# Patient Record
Sex: Female | Born: 1960 | Race: White | Hispanic: No | Marital: Married | Smoking: Never smoker
Health system: Southern US, Community
[De-identification: ages and names within clinical notes are randomized; demographics above are authoritative.]

## PROBLEM LIST (undated history)

## (undated) DIAGNOSIS — E079 Disorder of thyroid, unspecified: Secondary | ICD-10-CM

## (undated) DIAGNOSIS — G4730 Sleep apnea, unspecified: Secondary | ICD-10-CM

## (undated) DIAGNOSIS — G4700 Insomnia, unspecified: Secondary | ICD-10-CM

## (undated) DIAGNOSIS — M797 Fibromyalgia: Secondary | ICD-10-CM

## (undated) DIAGNOSIS — B009 Herpesviral infection, unspecified: Secondary | ICD-10-CM

## (undated) DIAGNOSIS — E559 Vitamin D deficiency, unspecified: Secondary | ICD-10-CM

## (undated) DIAGNOSIS — F32A Depression, unspecified: Secondary | ICD-10-CM

## (undated) DIAGNOSIS — L408 Other psoriasis: Secondary | ICD-10-CM

## (undated) DIAGNOSIS — F329 Major depressive disorder, single episode, unspecified: Secondary | ICD-10-CM

## (undated) DIAGNOSIS — E785 Hyperlipidemia, unspecified: Secondary | ICD-10-CM

## (undated) DIAGNOSIS — K219 Gastro-esophageal reflux disease without esophagitis: Secondary | ICD-10-CM

## (undated) DIAGNOSIS — T7840XA Allergy, unspecified, initial encounter: Secondary | ICD-10-CM

## (undated) DIAGNOSIS — M1990 Unspecified osteoarthritis, unspecified site: Secondary | ICD-10-CM

## (undated) DIAGNOSIS — E669 Obesity, unspecified: Secondary | ICD-10-CM

## (undated) HISTORY — PX: ESOPHAGOGASTRODUODENOSCOPY: SHX1529

## (undated) HISTORY — DX: Hyperlipidemia, unspecified: E78.5

## (undated) HISTORY — PX: OTHER SURGICAL HISTORY: SHX169

## (undated) HISTORY — DX: Sleep apnea, unspecified: G47.30

## (undated) HISTORY — DX: Gastro-esophageal reflux disease without esophagitis: K21.9

## (undated) HISTORY — DX: Insomnia, unspecified: G47.00

## (undated) HISTORY — DX: Major depressive disorder, single episode, unspecified: F32.9

## (undated) HISTORY — DX: Depression, unspecified: F32.A

## (undated) HISTORY — PX: LIPOMA EXCISION: SHX5283

## (undated) HISTORY — DX: Fibromyalgia: M79.7

## (undated) HISTORY — DX: Obesity, unspecified: E66.9

## (undated) HISTORY — DX: Vitamin D deficiency, unspecified: E55.9

## (undated) HISTORY — DX: Herpesviral infection, unspecified: B00.9

## (undated) HISTORY — DX: Allergy, unspecified, initial encounter: T78.40XA

## (undated) HISTORY — DX: Disorder of thyroid, unspecified: E07.9

## (undated) HISTORY — PX: COLONOSCOPY: SHX174

## (undated) HISTORY — PX: TONSILECTOMY, ADENOIDECTOMY, BILATERAL MYRINGOTOMY AND TUBES: SHX2538

---

## 2002-05-28 LAB — HM COLONOSCOPY

## 2003-12-25 ENCOUNTER — Ambulatory Visit: Payer: Self-pay

## 2004-01-21 ENCOUNTER — Ambulatory Visit: Payer: Self-pay

## 2004-05-06 ENCOUNTER — Ambulatory Visit (HOSPITAL_COMMUNITY): Admission: RE | Admit: 2004-05-06 | Discharge: 2004-05-06 | Payer: Self-pay | Admitting: Gynecology

## 2004-11-29 ENCOUNTER — Ambulatory Visit: Payer: Self-pay

## 2005-01-17 ENCOUNTER — Ambulatory Visit: Payer: Self-pay

## 2005-03-13 HISTORY — PX: ABDOMINAL HYSTERECTOMY: SHX81

## 2006-10-12 ENCOUNTER — Ambulatory Visit: Payer: Self-pay | Admitting: Internal Medicine

## 2006-11-01 ENCOUNTER — Ambulatory Visit: Payer: Self-pay | Admitting: Internal Medicine

## 2006-11-12 ENCOUNTER — Ambulatory Visit: Payer: Self-pay | Admitting: Internal Medicine

## 2006-12-05 ENCOUNTER — Ambulatory Visit: Payer: Self-pay | Admitting: Unknown Physician Specialty

## 2006-12-12 ENCOUNTER — Ambulatory Visit: Payer: Self-pay | Admitting: Internal Medicine

## 2006-12-14 ENCOUNTER — Ambulatory Visit: Payer: Self-pay | Admitting: Unknown Physician Specialty

## 2006-12-20 ENCOUNTER — Ambulatory Visit: Payer: Self-pay | Admitting: Unknown Physician Specialty

## 2007-02-11 ENCOUNTER — Ambulatory Visit: Payer: Self-pay | Admitting: Internal Medicine

## 2007-02-19 ENCOUNTER — Ambulatory Visit: Payer: Self-pay | Admitting: Internal Medicine

## 2007-03-14 ENCOUNTER — Ambulatory Visit: Payer: Self-pay | Admitting: Internal Medicine

## 2007-05-20 ENCOUNTER — Ambulatory Visit: Payer: Self-pay | Admitting: Internal Medicine

## 2007-12-06 ENCOUNTER — Ambulatory Visit: Payer: Self-pay | Admitting: Family Medicine

## 2010-06-03 ENCOUNTER — Ambulatory Visit: Payer: Self-pay | Admitting: Family Medicine

## 2010-06-03 LAB — HM MAMMOGRAPHY: HM MAMMO: NORMAL

## 2011-01-03 LAB — HM PAP SMEAR: HM Pap smear: NORMAL

## 2012-09-26 LAB — LIPID PANEL
Cholesterol: 267 mg/dL — AB (ref 0–200)
HDL: 47 mg/dL (ref 35–70)
LDL Cholesterol: 164 mg/dL
TRIGLYCERIDES: 278 mg/dL — AB (ref 40–160)

## 2013-03-18 ENCOUNTER — Ambulatory Visit: Payer: Self-pay | Admitting: Family Medicine

## 2013-03-28 ENCOUNTER — Ambulatory Visit: Payer: Self-pay | Admitting: Family Medicine

## 2013-08-05 ENCOUNTER — Inpatient Hospital Stay: Payer: Self-pay | Admitting: Surgery

## 2013-08-05 LAB — COMPREHENSIVE METABOLIC PANEL
ANION GAP: 8 (ref 7–16)
AST: 13 U/L — AB (ref 15–37)
Albumin: 3.5 g/dL (ref 3.4–5.0)
Alkaline Phosphatase: 149 U/L — ABNORMAL HIGH
BUN: 20 mg/dL — ABNORMAL HIGH (ref 7–18)
Bilirubin,Total: 0.3 mg/dL (ref 0.2–1.0)
CHLORIDE: 104 mmol/L (ref 98–107)
CO2: 25 mmol/L (ref 21–32)
CREATININE: 0.96 mg/dL (ref 0.60–1.30)
Calcium, Total: 9.5 mg/dL (ref 8.5–10.1)
EGFR (African American): >60
EGFR (Non-African Amer.): >60
GLUCOSE: 106 mg/dL — AB (ref 65–99)
Osmolality: 277 (ref 275–301)
Potassium: 3.9 mmol/L (ref 3.5–5.1)
SGPT (ALT): 19 U/L (ref 12–78)
Sodium: 137 mmol/L (ref 136–145)
Total Protein: 8.1 g/dL (ref 6.4–8.2)

## 2013-08-05 LAB — CBC
HCT: 39.3 % (ref 35.0–47.0)
HGB: 12.7 g/dL (ref 12.0–16.0)
MCH: 27.1 pg (ref 26.0–34.0)
MCHC: 32.3 g/dL (ref 32.0–36.0)
MCV: 84 fL (ref 80–100)
PLATELETS: 336 10*3/uL (ref 150–440)
RBC: 4.69 10*6/uL (ref 3.80–5.20)
RDW: 14.5 % (ref 11.5–14.5)
WBC: 9.3 10*3/uL (ref 3.6–11.0)

## 2013-08-05 LAB — URINALYSIS, COMPLETE
BACTERIA: NONE SEEN
Bilirubin,UR: NEGATIVE
Blood: NEGATIVE
Glucose,UR: NEGATIVE mg/dL (ref 0–75)
Ketone: NEGATIVE
LEUKOCYTE ESTERASE: NEGATIVE
NITRITE: NEGATIVE
Ph: 6 (ref 4.5–8.0)
Protein: 30
RBC,UR: 1 /HPF (ref 0–5)
Specific Gravity: 1.025 (ref 1.003–1.030)
Squamous Epithelial: 3
WBC UR: 3 /HPF (ref 0–5)

## 2013-08-05 LAB — LIPASE, BLOOD: LIPASE: 231 U/L (ref 73–393)

## 2013-08-06 LAB — COMPREHENSIVE METABOLIC PANEL
ALBUMIN: 3 g/dL — AB (ref 3.4–5.0)
ALK PHOS: 145 U/L — AB
Anion Gap: 6 — ABNORMAL LOW (ref 7–16)
BILIRUBIN TOTAL: 0.4 mg/dL (ref 0.2–1.0)
BUN: 17 mg/dL (ref 7–18)
CHLORIDE: 104 mmol/L (ref 98–107)
Calcium, Total: 8.7 mg/dL (ref 8.5–10.1)
Co2: 29 mmol/L (ref 21–32)
Creatinine: 0.96 mg/dL (ref 0.60–1.30)
EGFR (African American): >60
EGFR (Non-African Amer.): >60
Glucose: 94 mg/dL (ref 65–99)
OSMOLALITY: 279 (ref 275–301)
Potassium: 3.7 mmol/L (ref 3.5–5.1)
SGOT(AST): 17 U/L (ref 15–37)
SGPT (ALT): 22 U/L (ref 12–78)
SODIUM: 139 mmol/L (ref 136–145)
Total Protein: 6.8 g/dL (ref 6.4–8.2)

## 2013-08-06 LAB — CBC WITH DIFFERENTIAL/PLATELET
Basophil #: 0 10*3/uL (ref 0.0–0.1)
Basophil %: 0.9 %
Eosinophil #: 0 10*3/uL (ref 0.0–0.7)
Eosinophil %: 0.6 %
HCT: 35.3 % (ref 35.0–47.0)
HGB: 11.5 g/dL — ABNORMAL LOW (ref 12.0–16.0)
LYMPHS PCT: 32.2 %
Lymphocyte #: 1.7 10*3/uL (ref 1.0–3.6)
MCH: 27.8 pg (ref 26.0–34.0)
MCHC: 32.5 g/dL (ref 32.0–36.0)
MCV: 85 fL (ref 80–100)
Monocyte #: 0.4 x10 3/mm (ref 0.2–0.9)
Monocyte %: 7 %
NEUTROS ABS: 3.2 10*3/uL (ref 1.4–6.5)
Neutrophil %: 59.3 %
Platelet: 245 10*3/uL (ref 150–440)
RBC: 4.14 10*6/uL (ref 3.80–5.20)
RDW: 14.9 % — ABNORMAL HIGH (ref 11.5–14.5)
WBC: 5.3 10*3/uL (ref 3.6–11.0)

## 2013-08-07 LAB — COMPREHENSIVE METABOLIC PANEL
ANION GAP: 3 — AB (ref 7–16)
AST: 35 U/L (ref 15–37)
Albumin: 3.1 g/dL — ABNORMAL LOW (ref 3.4–5.0)
Alkaline Phosphatase: 137 U/L — ABNORMAL HIGH
BILIRUBIN TOTAL: 0.2 mg/dL (ref 0.2–1.0)
BUN: 9 mg/dL (ref 7–18)
CREATININE: 1.02 mg/dL (ref 0.60–1.30)
Calcium, Total: 9.1 mg/dL (ref 8.5–10.1)
Chloride: 105 mmol/L (ref 98–107)
Co2: 30 mmol/L (ref 21–32)
EGFR (African American): >60
EGFR (Non-African Amer.): >60
GLUCOSE: 135 mg/dL — AB (ref 65–99)
Osmolality: 276 (ref 275–301)
POTASSIUM: 4.8 mmol/L (ref 3.5–5.1)
SGPT (ALT): 35 U/L (ref 12–78)
Sodium: 138 mmol/L (ref 136–145)
Total Protein: 7.2 g/dL (ref 6.4–8.2)

## 2013-08-07 LAB — CBC WITH DIFFERENTIAL/PLATELET
BASOS ABS: 0 10*3/uL (ref 0.0–0.1)
Basophil %: 0.1 %
Eosinophil #: 0 10*3/uL (ref 0.0–0.7)
Eosinophil %: 0 %
HCT: 35.1 % (ref 35.0–47.0)
HGB: 11.3 g/dL — ABNORMAL LOW (ref 12.0–16.0)
LYMPHS ABS: 1 10*3/uL (ref 1.0–3.6)
Lymphocyte %: 8.1 %
MCH: 27.3 pg (ref 26.0–34.0)
MCHC: 32.1 g/dL (ref 32.0–36.0)
MCV: 85 fL (ref 80–100)
Monocyte #: 0.3 x10 3/mm (ref 0.2–0.9)
Monocyte %: 2.8 %
NEUTROS ABS: 11.1 10*3/uL — AB (ref 1.4–6.5)
NEUTROS PCT: 89 %
PLATELETS: 283 10*3/uL (ref 150–440)
RBC: 4.14 10*6/uL (ref 3.80–5.20)
RDW: 14.3 % (ref 11.5–14.5)
WBC: 12.4 10*3/uL — ABNORMAL HIGH (ref 3.6–11.0)

## 2013-08-07 LAB — PATHOLOGY REPORT

## 2014-07-04 NOTE — H&P (Signed)
PATIENT NAME:  Debra Soto, HASELEY MR#:  [id number] DATE OF BIRTH:  1960-12-03  DATE OF ADMISSION:  08/05/2013  CHIEF COMPLAINT: Right upper quadrant pain.   HISTORY OF PRESENT ILLNESS: This is a patient with a first and single episode of right upper quadrant pain, started at 5:00 this morning. It has much improved, but is still present. She had multiple emesis and nausea earlier. Denies fevers or chills. Denies jaundice or acholic stools. Has never had an episode like this before. I was asked to see the patient in the Emergency Room concerning her ongoing pain and nausea.   PAST MEDICAL HISTORY: Fibromyalgia, hypothyroidism.   PAST SURGICAL HISTORY: Tumor on the right arm, bladder suspension and hysterectomy. No other abdominal surgeries.   ALLERGIES: None.   MEDICATIONS: Multiple, see medical reconciliation.   FAMILY HISTORY: Noncontributory.   SOCIAL HISTORY: The patient does not smoke. She runs a [employer].   REVIEW OF SYSTEMS: Ten system review is performed and negative with the exception of that mentioned in the history of present illness.   PHYSICAL EXAMINATION: GENERAL: Obese female patient.  VITAL SIGNS: Weight 240 pounds, BMI of 36.5. Temperature 97.6, pulse 67, respirations 18, blood pressure 117/69, pain scale of two, 99% room air sat.  HEENT: Shows no scleral icterus.  NECK: No palpable neck nodes.  CHEST: Clear to auscultation.  CARDIAC: Regular rate and rhythm.   ABDOMEN: Soft. There is tenderness in the right upper quadrant with a questionable Murphy's sign.  EXTREMITIES: Without edema.  NEUROLOGIC: Grossly intact.  INTEGUMENT: No jaundice.   An ultrasound shows stones with a negative sonographic Murphy's sign.   LABORATORY VALUES: Demonstrate a normal white blood cell count and no elevation of the liver function tests.   ASSESSMENT AND PLAN: This is a patient with probable early acute cholecystitis. She has had some nausea, vomiting and pain control  issues. I have recommended admission to the hospital and hydration, starting IV antibiotics and then performing a laparoscopic cholecystectomy. I described for her the procedure itself and also the options of observation and scheduling this as an outpatient at a later date, but she has had some nausea and vomiting as well and desires admission at this time. The risks of bleeding and infection, conversion to an open procedure were reviewed. She understood and agreed to proceed.   ____________________________ Jerrol Banana Burt Knack, MD rec:sg D: 08/05/2013 14:42:22 ET T: 08/05/2013 15:06:38 ET JOB#: [id number]  cc: Jerrol Banana. Burt Knack, MD, <Dictator> Florene Glen MD ELECTRONICALLY SIGNED 08/05/2013 18:16

## 2014-07-04 NOTE — Op Note (Signed)
PATIENT NAME:  Debra, Soto MR#:  [id number] DATE OF BIRTH:  January 13, 1961  DATE OF PROCEDURE:  08/06/2013  PREOPERATIVE DIAGNOSIS: Acute cholecystitis.   POSTOPERATIVE DIAGNOSIS: Acute cholecystitis.   PROCEDURE: Laparoscopic cholecystectomy.   SURGEON: Latressa Harries E. Burt Knack, MD  ANESTHESIA: General with endotracheal tube.   INDICATIONS: This is a patient with right upper quadrant pain and workup showing gallstones, possible acute cholecystitis. Preoperatively, we discussed the rationale for surgery, the options of observation, risk of bleeding, infection, recurrence of symptoms, failure to resolve her symptoms, open procedure, bile duct damage, bile duct leak, retained common bile duct stone, any of which could require further surgery and/or ERCP, stent and papillotomy. This was all reviewed for her in the preop holding area, she understood and agreed to proceed.   FINDINGS: Inflamed gallbladder with some adhesions around it.   DESCRIPTION OF PROCEDURE: The patient was induced to general anesthesia. She was on IV antibiotics. VTE prophylaxis was in place. She was prepped and draped in a sterile fashion. Marcaine was infiltrated in skin and subcutaneous tissues in the periumbilical area, and an incision was made. Veress needle was placed. Pneumoperitoneum was obtained. A 5 mm trocar port was placed. The abdominal cavity was explored, and under direct vision, a 10 mm epigastric port and 2 lateral 5 mm ports were placed. The gallbladder was placed on tension. The peritoneum over the infundibulum was incised bluntly after taking down some adhesions bluntly. The cystic duct-gallbladder junction was well identified, doubly clipped and divided. The cystic artery and cystic lymphatics were doubly clipped and divided, and the gallbladder was taken from the gallbladder fossa with electrocautery and passed out through the epigastric port site. The port site was replaced. The area was irrigated with copious amounts  of normal saline. Hemostasis was adequate. There was no sign of bleeding, bile leak or bowel injury. The camera was placed in the epigastric site to view back to the periumbilical site. Again, no sign of bowel injury or bleeding. Therefore, pneumoperitoneum was released. All ports were removed. Fascial edges at the epigastric site were approximated with figure-of-eight 0 Vicryls, and 4-0 subcuticular Monocryl was used on all skin edges. Steri-Strips, Mastisol and sterile dressings were placed.   The patient tolerated the procedure well. There were no complications. She was taken to the recovery room in stable condition to be admitted for continued care.    ____________________________ Jerrol Banana. Burt Knack, MD rec:lb D: 08/06/2013 11:07:56 ET T: 08/06/2013 11:31:48 ET JOB#: [id number]  cc: Jerrol Banana. Burt Knack, MD, <Dictator> Florene Glen MD ELECTRONICALLY SIGNED 08/06/2013 14:38

## 2014-08-12 HISTORY — PX: CHOLECYSTECTOMY: SHX55

## 2014-08-25 ENCOUNTER — Other Ambulatory Visit: Payer: Self-pay | Admitting: Family Medicine

## 2014-08-25 NOTE — Telephone Encounter (Signed)
Medication Refill

## 2014-08-25 NOTE — Telephone Encounter (Signed)
I will send one month, she should try going to [facility] if she has a gap in her insurance

## 2014-08-25 NOTE — Telephone Encounter (Signed)
Patient notified of 1 month of Wellbutrin called into pharmacy and she would need a follow up for further refills. Also informed patient of [facility] due to [employer].

## 2014-08-25 NOTE — Telephone Encounter (Signed)
Needs follow-up

## 2014-09-19 ENCOUNTER — Other Ambulatory Visit: Payer: Self-pay | Admitting: Family Medicine

## 2014-09-19 NOTE — Telephone Encounter (Signed)
Please make sure patient has a follow up - 3 to 4 months from her last visit. Thank you

## 2014-09-29 ENCOUNTER — Encounter: Payer: Self-pay | Admitting: Family Medicine

## 2014-09-29 DIAGNOSIS — Z803 Family history of malignant neoplasm of breast: Secondary | ICD-10-CM | POA: Insufficient documentation

## 2014-09-29 DIAGNOSIS — K589 Irritable bowel syndrome without diarrhea: Secondary | ICD-10-CM | POA: Insufficient documentation

## 2014-09-29 DIAGNOSIS — M797 Fibromyalgia: Secondary | ICD-10-CM | POA: Insufficient documentation

## 2014-09-29 DIAGNOSIS — N3941 Urge incontinence: Secondary | ICD-10-CM | POA: Insufficient documentation

## 2014-09-29 DIAGNOSIS — N951 Menopausal and female climacteric states: Secondary | ICD-10-CM | POA: Insufficient documentation

## 2014-09-29 DIAGNOSIS — E039 Hypothyroidism, unspecified: Secondary | ICD-10-CM | POA: Insufficient documentation

## 2014-09-29 DIAGNOSIS — E559 Vitamin D deficiency, unspecified: Secondary | ICD-10-CM | POA: Insufficient documentation

## 2014-09-29 DIAGNOSIS — F339 Major depressive disorder, recurrent, unspecified: Secondary | ICD-10-CM | POA: Insufficient documentation

## 2014-09-29 DIAGNOSIS — E785 Hyperlipidemia, unspecified: Secondary | ICD-10-CM | POA: Insufficient documentation

## 2014-09-29 DIAGNOSIS — K219 Gastro-esophageal reflux disease without esophagitis: Secondary | ICD-10-CM | POA: Insufficient documentation

## 2014-09-29 DIAGNOSIS — R809 Proteinuria, unspecified: Secondary | ICD-10-CM | POA: Insufficient documentation

## 2014-09-29 DIAGNOSIS — L503 Dermatographic urticaria: Secondary | ICD-10-CM | POA: Insufficient documentation

## 2014-09-29 DIAGNOSIS — G4733 Obstructive sleep apnea (adult) (pediatric): Secondary | ICD-10-CM | POA: Insufficient documentation

## 2014-09-29 DIAGNOSIS — G4700 Insomnia, unspecified: Secondary | ICD-10-CM | POA: Insufficient documentation

## 2014-09-29 DIAGNOSIS — J309 Allergic rhinitis, unspecified: Secondary | ICD-10-CM | POA: Insufficient documentation

## 2014-09-29 DIAGNOSIS — B001 Herpesviral vesicular dermatitis: Secondary | ICD-10-CM | POA: Insufficient documentation

## 2014-09-29 DIAGNOSIS — G44209 Tension-type headache, unspecified, not intractable: Secondary | ICD-10-CM | POA: Insufficient documentation

## 2014-09-30 ENCOUNTER — Ambulatory Visit: Payer: Self-pay | Admitting: Family Medicine

## 2014-10-02 ENCOUNTER — Other Ambulatory Visit: Payer: Self-pay | Admitting: Family Medicine

## 2014-10-02 NOTE — Telephone Encounter (Signed)
Patient requesting refill. 

## 2014-10-03 ENCOUNTER — Other Ambulatory Visit: Payer: Self-pay | Admitting: Family Medicine

## 2014-10-03 NOTE — Telephone Encounter (Signed)
Needs follow-up

## 2014-12-22 ENCOUNTER — Ambulatory Visit: Payer: Self-pay | Admitting: Family Medicine

## 2014-12-31 ENCOUNTER — Ambulatory Visit (INDEPENDENT_AMBULATORY_CARE_PROVIDER_SITE_OTHER): Payer: Self-pay | Admitting: Family Medicine

## 2014-12-31 ENCOUNTER — Encounter: Payer: Self-pay | Admitting: Family Medicine

## 2014-12-31 VITALS — BP 134/78 | HR 92 | Temp 98.1°F | Resp 18 | Ht 68.0 in | Wt 262.3 lb

## 2014-12-31 DIAGNOSIS — N809 Endometriosis, unspecified: Secondary | ICD-10-CM | POA: Insufficient documentation

## 2014-12-31 DIAGNOSIS — K219 Gastro-esophageal reflux disease without esophagitis: Secondary | ICD-10-CM

## 2014-12-31 DIAGNOSIS — M797 Fibromyalgia: Secondary | ICD-10-CM

## 2014-12-31 DIAGNOSIS — E038 Other specified hypothyroidism: Secondary | ICD-10-CM

## 2014-12-31 DIAGNOSIS — G4733 Obstructive sleep apnea (adult) (pediatric): Secondary | ICD-10-CM

## 2014-12-31 DIAGNOSIS — G4700 Insomnia, unspecified: Secondary | ICD-10-CM

## 2014-12-31 DIAGNOSIS — E785 Hyperlipidemia, unspecified: Secondary | ICD-10-CM

## 2014-12-31 DIAGNOSIS — F329 Major depressive disorder, single episode, unspecified: Secondary | ICD-10-CM

## 2014-12-31 DIAGNOSIS — Z23 Encounter for immunization: Secondary | ICD-10-CM

## 2014-12-31 MED ORDER — LEVOTHYROXINE SODIUM 200 MCG PO TABS: 200.0000 ug | ORAL_TABLET | Freq: Every day | ORAL | Status: DC

## 2014-12-31 MED ORDER — CITALOPRAM HYDROBROMIDE 40 MG PO TABS: 40.0000 mg | ORAL_TABLET | Freq: Every day | ORAL | Status: DC

## 2014-12-31 MED ORDER — TRAZODONE HCL 50 MG PO TABS: 50.0000 mg | ORAL_TABLET | Freq: Every evening | ORAL | Status: DC | PRN

## 2014-12-31 MED ORDER — IBUPROFEN 600 MG PO TABS: 600.0000 mg | ORAL_TABLET | Freq: Three times a day (TID) | ORAL | Status: DC

## 2014-12-31 MED ORDER — OMEPRAZOLE 20 MG PO CPDR: 20.0000 mg | DELAYED_RELEASE_CAPSULE | Freq: Two times a day (BID) | ORAL | Status: DC

## 2014-12-31 MED ORDER — AMPHETAMINE-DEXTROAMPHET ER 10 MG PO CP24: 10.0000 mg | ORAL_CAPSULE | Freq: Every day | ORAL | Status: DC

## 2014-12-31 MED ORDER — BACLOFEN 10 MG PO TABS: 10.0000 mg | ORAL_TABLET | ORAL | Status: DC | PRN

## 2014-12-31 MED ORDER — BUPROPION HCL ER (XL) 150 MG PO TB24: 150.0000 mg | ORAL_TABLET | Freq: Every day | ORAL | Status: DC

## 2014-12-31 NOTE — Progress Notes (Signed)
Name: Debra Soto   MRN: [id number]    DOB: 1960/09/19   Date:12/31/2014       Progress Note  Subjective  Chief Complaint  Chief Complaint  Patient presents with  . Medication Refill    follow-up not currently on any medications(she has ran out)  . Depression  . Fibromyalgia  . Gastroesophageal Reflux  . Menopause    HPI  Major Depression: she has been out of her medications, gap in insurance. She has been very depressed, emotional liability, angry, crying spells. She feels tired all the time, but not sure if from [facility]. She denies suicidal thoughts or ideation.  Her 64 yo daughter and 29 yo son are still living at home, also carrying for her disabled mother.   FMS: she has daily pain, has taken Hydrocodone prn for pain, but we will try to resume other medications first including Ibuprofen. She also has severe mental fogginess, fatigue. Stiffness.   GERD: well controlled with Omeprazole twice daily, but when she tries to wean self off symptoms gets worse  Insomnia: takes Trazodone prn, would like to refill medication  Menopause: night sweats have improved, but having emotional liability. Off Clonidine. Still has hot flashes, we will resume antidepressants.   Hypothyroidism: she has been off medication for months, and we will resume medication, same dose , but needs to take half daily for the first week and titrate up   OSA: she has not been using her CPAP machine and is only sleeping at most 4 hours per night, out of Trazodone  Patient Active Problem List   Diagnosis Date Noted  . Endometriosis 12/31/2014  . Insomnia, persistent 09/29/2014  . IBS (irritable bowel syndrome) 09/29/2014  . 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 Dyslipidemia 09/29/2014  . Fibromyalgia syndrome 09/29/2014  . Gastro-esophageal reflux disease without esophagitis 09/29/2014  . Herpes labialis 09/29/2014  . Adult hypothyroidism 09/29/2014  . Family history of malignant neoplasm of breast 09/29/2014   . Extreme obesity ([place]) 09/29/2014  . Major depressive episode 09/29/2014  . Obstructive apnea 09/29/2014  . Abnormal presence of protein in urine 09/29/2014  . Allergic rhinitis 09/29/2014  . Vitamin D deficiency 09/29/2014  . Urge incontinence 09/29/2014  . Headache, tension-type 09/29/2014  . Menopausal symptom 09/29/2014    Past Surgical History  Procedure Laterality Date  . Cholecystectomy  [id number]  . Abdominal hysterectomy  [id number]  . Tonsilectomy, adenoidectomy, bilateral myringotomy and tubes    . Lipoma excision    . Bladder tact      Family History  Problem Relation Age of Onset  . Cancer Mother     ovarian,breat  . Hypertension Mother   . Arthritis Mother   . Diabetes Brother   . Hypertension Brother     Social History   Social History  . Marital Status: Married    Spouse Name: N/A  . Number of Children: N/A  . Years of Education: N/A   Occupational History  . Not on file.   Social History Main Topics  . Smoking status: Never Smoker   . Smokeless tobacco: Never Used  . Alcohol Use: 0.0 oz/week    0 Standard drinks or equivalent per week  . Drug Use: No  . Sexual Activity: Yes   Other Topics Concern  . Not on file   Social History Narrative     Current outpatient prescriptions:  .  baclofen (LIORESAL) 10 MG tablet, Take 1 tablet (10 mg total) by mouth as needed., Disp: 30 each, Rfl:  2 .  citalopram (CELEXA) 40 MG tablet, Take 1 tablet (40 mg total) by mouth daily., Disp: 30 tablet, Rfl: 2 .  valACYclovir (VALTREX) 1000 MG tablet, Take 1 tablet by mouth as needed., Disp: , Rfl:  .  amphetamine-dextroamphetamine (ADDERALL XR) 10 MG 24 hr capsule, Take 1 capsule (10 mg total) by mouth daily., Disp: 30 capsule, Rfl: 0 .  buPROPion (WELLBUTRIN XL) 150 MG 24 hr tablet, Take 1 tablet (150 mg total) by mouth daily., Disp: 30 tablet, Rfl: 2 .  ibuprofen (ADVIL,MOTRIN) 600 MG tablet, Take 1 tablet (600 mg total) by mouth 3 (three) times daily., Disp:  90 tablet, Rfl: 0 .  levothyroxine (SYNTHROID) 200 MCG tablet, Take 1 tablet (200 mcg total) by mouth daily., Disp: 30 tablet, Rfl: 0 .  omeprazole (PRILOSEC) 20 MG capsule, Take 1 capsule (20 mg total) by mouth 2 (two) times daily before a meal., Disp: 60 capsule, Rfl: 2 .  traZODone (DESYREL) 50 MG tablet, Take 1 tablet (50 mg total) by mouth at bedtime as needed., Disp: 30 tablet, Rfl: 2  Not on File   ROS  Constitutional: Negative for fever, positive for mild  weight change.  Respiratory: Negative for cough and shortness of breath.   Cardiovascular: Negative for chest pain or palpitations.  Gastrointestinal: Negative for abdominal pain, no bowel changes.  Musculoskeletal: Negative for gait problem or joint swelling.  Skin: Negative for rash.  Neurological: Negative for dizziness or headache.  No other specific complaints in a complete review of systems (except as listed in HPI above).  Objective  Filed Vitals:   12/31/14 1103  BP: 128/92  Pulse: 92  Temp: 98.1 F (36.7 C)  TempSrc: Oral  Resp: 18  Height: 5\' 8"  (1.727 m)  Weight: 262 lb 4.8 oz (118.978 kg)  SpO2: 96%    Body mass index is 39.89 kg/(m^2).  Physical Exam  Constitutional: Patient appears well-developed and well-nourished. Obese No distress.  HEENT: head atraumatic, normocephalic, pupils equal and reactive to light, neck supple, throat within normal limits Cardiovascular: Normal rate, regular rhythm and normal heart sounds.  No murmur heard. No BLE edema. Pulmonary/Chest: Effort normal and breath sounds normal. No respiratory distress. Abdominal: Soft.  There is no tenderness. Psychiatric: Patient is sad, cried during visit Judgment and thought content normal. Muscular Skeletal: trigger point positive 18   PHQ2/9: Depression screen PHQ 2/9 12/31/2014  Decreased Interest 0  Down, Depressed, Hopeless 1  PHQ - 2 Score 1     Fall Risk: Fall Risk  12/31/2014  Falls in the past year? No      Functional Status Survey: Is the patient deaf or have difficulty hearing?: No Does the patient have difficulty seeing, even when wearing glasses/contacts?: Yes (reading glasses) Does the patient have difficulty concentrating, remembering, or making decisions?: No Does the patient have difficulty walking or climbing stairs?: No Does the patient have difficulty dressing or bathing?: No Does the patient have difficulty doing errands alone such as visiting a doctor's office or shopping?: No   Assessment & Plan  1. Major depressive episode  Resume medication  - amphetamine-dextroamphetamine (ADDERALL XR) 10 MG 24 hr capsule; Take 1 capsule (10 mg total) by mouth daily.  Dispense: 30 capsule; Refill: 0 - buPROPion (WELLBUTRIN XL) 150 MG 24 hr tablet; Take 1 tablet (150 mg total) by mouth daily.  Dispense: 30 tablet; Refill: 2 - citalopram (CELEXA) 40 MG tablet; Take 1 tablet (40 mg total) by mouth daily.  Dispense: 30 tablet;  Refill: 2  2. Needs flu shot  - Flu Vaccine QUAD 36+ mos PF IM (Fluarix & Fluzone Quad PF)  3. Dyslipidemia  Recheck labs next visit  4. Other specified hypothyroidism  - levothyroxine (SYNTHROID) 200 MCG tablet; Take 1 tablet (200 mcg total) by mouth daily.  Dispense: 30 tablet; Refill: 0  5. Obstructive apnea  Needs to resume medication   6. Gastro-esophageal reflux disease without esophagitis  Controlled with medication   - omeprazole (PRILOSEC) 20 MG capsule; Take 1 capsule (20 mg total) by mouth 2 (two) times daily before a meal.  Dispense: 60 capsule; Refill: 2  7. Fibromyalgia syndrome  Resume medications , hold off on Hydrocodone - amphetamine-dextroamphetamine (ADDERALL XR) 10 MG 24 hr capsule; Take 1 capsule (10 mg total) by mouth daily.  Dispense: 30 capsule; Refill: 0 - baclofen (LIORESAL) 10 MG tablet; Take 1 tablet (10 mg total) by mouth as needed.  Dispense: 30 each; Refill: 2 - ibuprofen (ADVIL,MOTRIN) 600 MG tablet; Take 1 tablet  (600 mg total) by mouth 3 (three) times daily.  Dispense: 90 tablet; Refill: 0  8. Insomnia, persistent  - traZODone (DESYREL) 50 MG tablet; Take 1 tablet (50 mg total) by mouth at bedtime as needed.  Dispense: 30 tablet; Refill: 2  9. Need for tetanus booster  - Tdap vaccine greater than or equal to 7yo IM

## 2015-01-29 ENCOUNTER — Ambulatory Visit (INDEPENDENT_AMBULATORY_CARE_PROVIDER_SITE_OTHER): Payer: 59 | Admitting: Family Medicine

## 2015-01-29 ENCOUNTER — Encounter: Payer: Self-pay | Admitting: Family Medicine

## 2015-01-29 VITALS — HR 95 | Temp 98.6°F | Resp 16 | Ht 68.0 in | Wt 263.4 lb

## 2015-01-29 DIAGNOSIS — Z79899 Other long term (current) drug therapy: Secondary | ICD-10-CM

## 2015-01-29 DIAGNOSIS — R3915 Urgency of urination: Secondary | ICD-10-CM | POA: Diagnosis not present

## 2015-01-29 DIAGNOSIS — E038 Other specified hypothyroidism: Secondary | ICD-10-CM

## 2015-01-29 DIAGNOSIS — R194 Change in bowel habit: Secondary | ICD-10-CM

## 2015-01-29 DIAGNOSIS — E559 Vitamin D deficiency, unspecified: Secondary | ICD-10-CM

## 2015-01-29 DIAGNOSIS — F329 Major depressive disorder, single episode, unspecified: Secondary | ICD-10-CM | POA: Diagnosis not present

## 2015-01-29 DIAGNOSIS — E785 Hyperlipidemia, unspecified: Secondary | ICD-10-CM | POA: Diagnosis not present

## 2015-01-29 LAB — POCT URINALYSIS DIPSTICK
BILIRUBIN UA: NEGATIVE
Blood, UA: NEGATIVE
Glucose, UA: NEGATIVE
Ketones, UA: NEGATIVE
LEUKOCYTES UA: NEGATIVE
NITRITE UA: NEGATIVE
Spec Grav, UA: 1.015
Urobilinogen, UA: NEGATIVE
pH, UA: 5

## 2015-01-29 MED ORDER — AMPHETAMINE-DEXTROAMPHET ER 15 MG PO CP24: 15.0000 mg | ORAL_CAPSULE | ORAL | Status: DC

## 2015-01-29 MED ORDER — CIPROFLOXACIN HCL 250 MG PO TABS: 250.0000 mg | ORAL_TABLET | Freq: Two times a day (BID) | ORAL | Status: DC

## 2015-01-29 NOTE — Progress Notes (Signed)
Name: Debra Soto   MRN: [id number]    DOB: 05/28/1960   Date:01/29/2015       Progress Note  Subjective  Chief Complaint  Chief Complaint  Patient presents with  . Medication Management    1 month F/U  . Depression    Patient started back on Adderall, Wellbutrin, and Celexa.   . Urinary Frequency    Onset 2-3 days, patient states she has to go frequently and when she does go her urine is in big volumes. Patient wanted her urine checked due to not having major symptoms of a UTI until it has already because of bad, to be a kidney infection.     HPI  Urinary frequency: symptoms started 2-3 days ago, she has noticed urinary frequency, nocturia, urine odor, no back pain, no fever  Hypothyroidism: back on medication for the past month, we will recheck levels in 2 more weeks  Major Depression: she is back on Citalopram, Wellbutrin and Adderal. She states she is not as sad or crying like she was on her last visit, one month ago, but still not in remission, and feels tired all the time. We will increase dose of Adderal   Bowel Changes: she has a history of chronic constipation, as a young adult had IBS with diarrhea, however over the past few months she has noticed diarrhea, described loose or water stools, associated with abdominal cramping, happens at least 3 times daily, large amount, no blood or mucus in stools. She also has urgency and had one episode of bowel incontinence when unable to get to a bathroom. No recent travel, nobody else in the family has diarrhea.    Patient Active Problem List   Diagnosis Date Noted  . Endometriosis 12/31/2014  . Insomnia, persistent 09/29/2014  . IBS (irritable bowel syndrome) 09/29/2014  . 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 Dyslipidemia 09/29/2014  . Fibromyalgia syndrome 09/29/2014  . Gastro-esophageal reflux disease without esophagitis 09/29/2014  . Herpes labialis 09/29/2014  . Adult hypothyroidism 09/29/2014  . Family history of  malignant neoplasm of breast 09/29/2014  . Extreme obesity ([place]) 09/29/2014  . Major depressive episode 09/29/2014  . Obstructive apnea 09/29/2014  . Abnormal presence of protein in urine 09/29/2014  . Allergic rhinitis 09/29/2014  . Vitamin D deficiency 09/29/2014  . Urge incontinence 09/29/2014  . Headache, tension-type 09/29/2014  . Menopausal symptom 09/29/2014    Past Surgical History  Procedure Laterality Date  . Cholecystectomy  [id number]  . Abdominal hysterectomy  [id number]  . Tonsilectomy, adenoidectomy, bilateral myringotomy and tubes    . Lipoma excision    . Bladder tact      Family History  Problem Relation Age of Onset  . Cancer Mother     ovarian,breat  . Hypertension Mother   . Arthritis Mother   . Diabetes Brother   . Hypertension Brother     Social History   Social History  . Marital Status: Married    Spouse Name: N/A  . Number of Children: N/A  . Years of Education: N/A   Occupational History  . Not on file.   Social History Main Topics  . Smoking status: Never Smoker   . Smokeless tobacco: Never Used  . Alcohol Use: 0.0 oz/week    0 Standard drinks or equivalent per week  . Drug Use: No  . Sexual Activity: Yes   Other Topics Concern  . Not on file   Social History Narrative     Current outpatient prescriptions:  .  amphetamine-dextroamphetamine (ADDERALL XR) 15 MG 24 hr capsule, Take 1 capsule by mouth every morning., Disp: 30 capsule, Rfl: 0 .  baclofen (LIORESAL) 10 MG tablet, Take 1 tablet (10 mg total) by mouth as needed., Disp: 30 each, Rfl: 2 .  buPROPion (WELLBUTRIN XL) 150 MG 24 hr tablet, Take 1 tablet (150 mg total) by mouth daily., Disp: 30 tablet, Rfl: 2 .  ciprofloxacin (CIPRO) 250 MG tablet, Take 1 tablet (250 mg total) by mouth 2 (two) times daily., Disp: 6 tablet, Rfl: 0 .  citalopram (CELEXA) 40 MG tablet, Take 1 tablet (40 mg total) by mouth daily., Disp: 30 tablet, Rfl: 2 .  ibuprofen (ADVIL,MOTRIN) 600 MG tablet,  Take 1 tablet (600 mg total) by mouth 3 (three) times daily., Disp: 90 tablet, Rfl: 0 .  levothyroxine (SYNTHROID) 200 MCG tablet, Take 1 tablet (200 mcg total) by mouth daily., Disp: 30 tablet, Rfl: 0 .  omeprazole (PRILOSEC) 20 MG capsule, Take 1 capsule (20 mg total) by mouth 2 (two) times daily before a meal., Disp: 60 capsule, Rfl: 2 .  traZODone (DESYREL) 50 MG tablet, Take 1 tablet (50 mg total) by mouth at bedtime as needed., Disp: 30 tablet, Rfl: 2 .  valACYclovir (VALTREX) 1000 MG tablet, Take 1 tablet by mouth as needed., Disp: , Rfl:   Not on File   ROS  Constitutional: Negative for fever or weight change.  Respiratory: Negative for cough and shortness of breath.   Cardiovascular: Negative for chest pain or palpitations.  Gastrointestinal: Positive for abdominal pain, no bowel changes.  Musculoskeletal: Negative for gait problem or joint swelling.  Skin: Negative for rash.  Neurological: Negative for dizziness but has  headache.  No other specific complaints in a complete review of systems (except as listed in HPI above).  Objective  Filed Vitals:   01/29/15 1103  Pulse: 95  Temp: 98.6 F (37 C)  TempSrc: Oral  Resp: 16  Height: 5\' 8"  (1.727 m)  Weight: 263 lb 6.4 oz (119.477 kg)  SpO2: 97%    Body mass index is 40.06 kg/(m^2).  Physical Exam  Constitutional: Patient appears well-developed and well-nourished. Obese  No distress.  HEENT: head atraumatic, normocephalic, pupils equal and reactive to light, neck supple, throat within normal limits, no thyromegaly  Cardiovascular: Normal rate, regular rhythm and normal heart sounds.  No murmur heard. No BLE edema. Pulmonary/Chest: Effort normal and breath sounds normal. No respiratory distress. Abdominal: Soft.  There is supra pubic  Tenderness. Negative CVA tenderness Psychiatric: Patient has a normal mood and affect. behavior is normal. Judgment and thought content normal.  Recent Results (from the past 2160  hour(s))  POCT Urinalysis Dipstick     Status: None   Collection Time: 01/29/15 11:08 AM  Result Value Ref Range   Color, UA dark yellow    Clarity, UA cloudy    Glucose, UA neg    Bilirubin, UA neg    Ketones, UA neg    Spec Grav, UA 1.015    Blood, UA neg    pH, UA 5.0    Protein, UA trace    Urobilinogen, UA negative    Nitrite, UA neg    Leukocytes, UA Negative Negative    PHQ2/9: Depression screen PHQ 2/9 12/31/2014  Decreased Interest 0  Down, Depressed, Hopeless 1  PHQ - 2 Score 1    Fall Risk: Fall Risk  12/31/2014  Falls in the past year? No     Assessment & Plan  1.  Urinary urgency  - POCT Urinalysis Dipstick - Urine culture - ciprofloxacin (CIPRO) 250 MG tablet; Take 1 tablet (250 mg total) by mouth 2 (two) times daily.  Dispense: 6 tablet; Refill: 0  2. Major depressive episode  We will increase dose of Adderal, and if it works call back for one more refill - amphetamine-dextroamphetamine (ADDERALL XR) 15 MG 24 hr capsule; Take 1 capsule by mouth every morning.  Dispense: 30 capsule; Refill: 0  3. Change in bowel habits  It may be thyroid related, or IBS but we will check stools DRG test  4. Dyslipidemia  - Lipid panel  5. Vitamin D deficiency  - VITAMIN D 25 Hydroxy (Vit-D Deficiency, Fractures)  6. Other specified hypothyroidism  - TSH  7. Long-term use of high-risk medication  - CBC with Differential/Platelet - Comprehensive metabolic panel

## 2015-01-31 LAB — URINE CULTURE

## 2015-02-15 ENCOUNTER — Other Ambulatory Visit: Payer: Self-pay | Admitting: Family Medicine

## 2015-02-16 ENCOUNTER — Ambulatory Visit: Payer: 59 | Admitting: Family Medicine

## 2015-02-16 ENCOUNTER — Ambulatory Visit (INDEPENDENT_AMBULATORY_CARE_PROVIDER_SITE_OTHER): Payer: 59 | Admitting: Family Medicine

## 2015-02-16 ENCOUNTER — Encounter: Payer: Self-pay | Admitting: Family Medicine

## 2015-02-16 VITALS — BP 126/92 | HR 124 | Temp 98.4°F | Resp 18 | Ht 68.0 in | Wt 257.4 lb

## 2015-02-16 DIAGNOSIS — E038 Other specified hypothyroidism: Secondary | ICD-10-CM

## 2015-02-16 DIAGNOSIS — J069 Acute upper respiratory infection, unspecified: Secondary | ICD-10-CM | POA: Diagnosis not present

## 2015-02-16 DIAGNOSIS — J40 Bronchitis, not specified as acute or chronic: Secondary | ICD-10-CM | POA: Diagnosis not present

## 2015-02-16 MED ORDER — PREDNISONE 20 MG PO TABS: 20.0000 mg | ORAL_TABLET | Freq: Every day | ORAL | Status: DC

## 2015-02-16 MED ORDER — AZITHROMYCIN 250 MG PO TABS: ORAL_TABLET | ORAL | Status: DC

## 2015-02-16 MED ORDER — LEVOTHYROXINE SODIUM 200 MCG PO TABS: 200.0000 ug | ORAL_TABLET | Freq: Every day | ORAL | Status: DC

## 2015-02-16 MED ORDER — BENZONATATE 100 MG PO CAPS: 100.0000 mg | ORAL_CAPSULE | Freq: Two times a day (BID) | ORAL | Status: DC | PRN

## 2015-02-16 NOTE — Progress Notes (Signed)
Name: Kateryn Nighswonger   MRN: [id number]    DOB: Nov 11, 1960   Date:02/16/2015       Progress Note  Subjective  Chief Complaint  Chief Complaint  Patient presents with  . URI    onset 2 weeks syptoms include cough, sputum, fever, headche, sinus pressure/pain and runny nose with post nasal drip    HPI  Bronchitis: she has been sick for the past few weeks, but much worse over the past few days. She has developed low grade fever, severe fatigue, some dizziness, sinus pressure, a constant cough, sometimes dry other times productive. Lack of appetite. She has also noticed chest congestion and difficulty breathing.    Patient Active Problem List   Diagnosis Date Noted  . Endometriosis 12/31/2014  . Insomnia, persistent 09/29/2014  . IBS (irritable bowel syndrome) 09/29/2014  . 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 Dyslipidemia 09/29/2014  . Fibromyalgia syndrome 09/29/2014  . Gastro-esophageal reflux disease without esophagitis 09/29/2014  . Herpes labialis 09/29/2014  . Adult hypothyroidism 09/29/2014  . Family history of malignant neoplasm of breast 09/29/2014  . Extreme obesity ([place]) 09/29/2014  . Major depressive episode 09/29/2014  . Obstructive apnea 09/29/2014  . Abnormal presence of protein in urine 09/29/2014  . Allergic rhinitis 09/29/2014  . Vitamin D deficiency 09/29/2014  . Urge incontinence 09/29/2014  . Headache, tension-type 09/29/2014  . Menopausal symptom 09/29/2014    Past Surgical History  Procedure Laterality Date  . Cholecystectomy  [id number]  . Abdominal hysterectomy  [id number]  . Tonsilectomy, adenoidectomy, bilateral myringotomy and tubes    . Lipoma excision    . Bladder tact      Family History  Problem Relation Age of Onset  . Cancer Mother     ovarian,breat  . Hypertension Mother   . Arthritis Mother   . Diabetes Brother   . Hypertension Brother     Social History   Social History  . Marital Status: Married    Spouse Name: N/A  . Number  of Children: N/A  . Years of Education: N/A   Occupational History  . Not on file.   Social History Main Topics  . Smoking status: Never Smoker   . Smokeless tobacco: Never Used  . Alcohol Use: 0.0 oz/week    0 Standard drinks or equivalent per week  . Drug Use: No  . Sexual Activity: Yes   Other Topics Concern  . Not on file   Social History Narrative     Current outpatient prescriptions:  .  amphetamine-dextroamphetamine (ADDERALL XR) 15 MG 24 hr capsule, Take 1 capsule by mouth every morning., Disp: 30 capsule, Rfl: 0 .  azithromycin (ZITHROMAX) 250 MG tablet, Take as directed, Disp: 6 tablet, Rfl: 0 .  baclofen (LIORESAL) 10 MG tablet, Take 1 tablet (10 mg total) by mouth as needed., Disp: 30 each, Rfl: 2 .  benzonatate (TESSALON) 100 MG capsule, Take 1-2 capsules (100-200 mg total) by mouth 2 (two) times daily as needed for cough., Disp: 40 capsule, Rfl: 0 .  buPROPion (WELLBUTRIN XL) 150 MG 24 hr tablet, Take 1 tablet (150 mg total) by mouth daily., Disp: 30 tablet, Rfl: 2 .  citalopram (CELEXA) 40 MG tablet, Take 1 tablet (40 mg total) by mouth daily., Disp: 30 tablet, Rfl: 2 .  ibuprofen (ADVIL,MOTRIN) 600 MG tablet, Take 1 tablet (600 mg total) by mouth 3 (three) times daily., Disp: 90 tablet, Rfl: 0 .  levothyroxine (SYNTHROID) 200 MCG tablet, Take 1 tablet (200 mcg total) by mouth  daily., Disp: 30 tablet, Rfl: 0 .  omeprazole (PRILOSEC) 20 MG capsule, Take 1 capsule (20 mg total) by mouth 2 (two) times daily before a meal., Disp: 60 capsule, Rfl: 2 .  predniSONE (DELTASONE) 20 MG tablet, Take 1 tablet (20 mg total) by mouth daily with breakfast., Disp: 10 tablet, Rfl: 0 .  traZODone (DESYREL) 50 MG tablet, Take 1 tablet (50 mg total) by mouth at bedtime as needed., Disp: 30 tablet, Rfl: 2 .  valACYclovir (VALTREX) 1000 MG tablet, Take 1 tablet by mouth as needed., Disp: , Rfl:   No Known Allergies   ROS  Ten systems reviewed and is negative except as mentioned in  HPI   Objective  Filed Vitals:   02/16/15 1437  BP: 126/92  Pulse: 124  Temp: 98.4 F (36.9 C)  TempSrc: Oral  Resp: 18  Height: 5\' 8"  (1.727 m)  Weight: 257 lb 6.4 oz (116.756 kg)  SpO2: 97%    Body mass index is 39.15 kg/(m^2).  Physical Exam  Constitutional: Patient appears well-developed and well-nourished. Obese No distress.  HEENT: head atraumatic, normocephalic, pupils equal and reactive to light, ears TM normal bilaterally, boggy turbinates, clear rhinorrhea, neck supple, throat within normal limits Cardiovascular: Normal rate, regular rhythm and normal heart sounds.  No murmur heard. No BLE edema. Pulmonary/Chest: Effort normal and breath sounds normal. No respiratory distress. Abdominal: Soft.  There is no tenderness. Psychiatric: Patient has a normal mood and affect. behavior is normal. Judgment and thought content normal.  Recent Results (from the past 2160 hour(s))  Urine culture     Status: None   Collection Time: 01/29/15 12:00 AM  Result Value Ref Range   Urine Culture, Routine Final report    Urine Culture result 1 Comment     Comment: Mixed urogenital flora 10,000-25,000 colony forming units per mL   POCT Urinalysis Dipstick     Status: None   Collection Time: 01/29/15 11:08 AM  Result Value Ref Range   Color, UA dark yellow    Clarity, UA cloudy    Glucose, UA neg    Bilirubin, UA neg    Ketones, UA neg    Spec Grav, UA 1.015    Blood, UA neg    pH, UA 5.0    Protein, UA trace    Urobilinogen, UA negative    Nitrite, UA neg    Leukocytes, UA Negative Negative     PHQ2/9: Depression screen PHQ 2/9 12/31/2014  Decreased Interest 0  Down, Depressed, Hopeless 1  PHQ - 2 Score 1    Fall Risk: Fall Risk  12/31/2014  Falls in the past year? No     Assessment & Plan  1. Bronchitis  - azithromycin (ZITHROMAX) 250 MG tablet; Take as directed  Dispense: 6 tablet; Refill: 0 - predniSONE (DELTASONE) 20 MG tablet; Take 1 tablet (20 mg  total) by mouth daily with breakfast.  Dispense: 10 tablet; Refill: 0 - benzonatate (TESSALON) 100 MG capsule; Take 1-2 capsules (100-200 mg total) by mouth 2 (two) times daily as needed for cough.  Dispense: 40 capsule; Refill: 0  2. Upper respiratory infection  May continue otc medication, increase fluid intake and call back if no improvement

## 2015-02-24 ENCOUNTER — Ambulatory Visit (INDEPENDENT_AMBULATORY_CARE_PROVIDER_SITE_OTHER): Payer: 59 | Admitting: Family Medicine

## 2015-02-24 ENCOUNTER — Other Ambulatory Visit: Payer: Self-pay | Admitting: Family Medicine

## 2015-02-24 ENCOUNTER — Encounter: Payer: Self-pay | Admitting: Family Medicine

## 2015-02-24 VITALS — BP 118/76 | HR 101 | Temp 98.2°F | Resp 18 | Ht 68.0 in | Wt 261.0 lb

## 2015-02-24 DIAGNOSIS — Z1211 Encounter for screening for malignant neoplasm of colon: Secondary | ICD-10-CM

## 2015-02-24 DIAGNOSIS — Z1239 Encounter for other screening for malignant neoplasm of breast: Secondary | ICD-10-CM

## 2015-02-24 DIAGNOSIS — Z113 Encounter for screening for infections with a predominantly sexual mode of transmission: Secondary | ICD-10-CM

## 2015-02-24 DIAGNOSIS — Z01419 Encounter for gynecological examination (general) (routine) without abnormal findings: Secondary | ICD-10-CM

## 2015-02-24 DIAGNOSIS — Z0000 Encounter for general adult medical examination without abnormal findings: Secondary | ICD-10-CM

## 2015-02-24 MED ORDER — FLUTICASONE PROPIONATE 50 MCG/ACT NA SUSP: 2.0000 | Freq: Every day | NASAL | Status: DC

## 2015-02-24 NOTE — Progress Notes (Signed)
Name: Debra Soto   MRN: [id number]    DOB: 06-14-1960   Date:02/24/2015       Progress Note  Subjective  Chief Complaint  Chief Complaint  Patient presents with  . Annual Exam    HPI  Well Woman: she has not been sexually active for the past year since husband cheated on her. She denies urinary or vaginal symptoms. Due for colonoscopy and mammogram.  Patient Active Problem List   Diagnosis Date Noted  . Endometriosis 12/31/2014  . Insomnia, persistent 09/29/2014  . IBS (irritable bowel syndrome) 09/29/2014  . 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 Dyslipidemia 09/29/2014  . Fibromyalgia syndrome 09/29/2014  . Gastro-esophageal reflux disease without esophagitis 09/29/2014  . Herpes labialis 09/29/2014  . Adult hypothyroidism 09/29/2014  . Family history of malignant neoplasm of breast 09/29/2014  . Extreme obesity ([place]) 09/29/2014  . Major depressive episode 09/29/2014  . Obstructive apnea 09/29/2014  . Abnormal presence of protein in urine 09/29/2014  . Allergic rhinitis 09/29/2014  . Vitamin D deficiency 09/29/2014  . Urge incontinence 09/29/2014  . Headache, tension-type 09/29/2014  . Menopausal symptom 09/29/2014    Past Surgical History  Procedure Laterality Date  . Cholecystectomy  [id number]  . Abdominal hysterectomy  [id number]  . Tonsilectomy, adenoidectomy, bilateral myringotomy and tubes    . Lipoma excision    . Bladder tact      Family History  Problem Relation Age of Onset  . Cancer Mother     ovarian,breat  . Hypertension Mother   . Arthritis Mother   . Diabetes Brother   . Hypertension Brother     Social History   Social History  . Marital Status: Married    Spouse Name: N/A  . Number of Children: N/A  . Years of Education: N/A   Occupational History  . Not on file.   Social History Main Topics  . Smoking status: Never Smoker   . Smokeless tobacco: Never Used  . Alcohol Use: 0.0 oz/week    0 Standard drinks or equivalent per week   . Drug Use: No  . Sexual Activity: Yes   Other Topics Concern  . Not on file   Social History Narrative     Current outpatient prescriptions:  .  amphetamine-dextroamphetamine (ADDERALL XR) 15 MG 24 hr capsule, Take 1 capsule by mouth every morning., Disp: 30 capsule, Rfl: 0 .  baclofen (LIORESAL) 10 MG tablet, Take 1 tablet (10 mg total) by mouth as needed., Disp: 30 each, Rfl: 2 .  benzonatate (TESSALON) 100 MG capsule, Take 1-2 capsules (100-200 mg total) by mouth 2 (two) times daily as needed for cough., Disp: 40 capsule, Rfl: 0 .  buPROPion (WELLBUTRIN XL) 150 MG 24 hr tablet, Take 1 tablet (150 mg total) by mouth daily., Disp: 30 tablet, Rfl: 2 .  citalopram (CELEXA) 40 MG tablet, Take 1 tablet (40 mg total) by mouth daily., Disp: 30 tablet, Rfl: 2 .  ibuprofen (ADVIL,MOTRIN) 600 MG tablet, Take 1 tablet (600 mg total) by mouth 3 (three) times daily., Disp: 90 tablet, Rfl: 0 .  levothyroxine (SYNTHROID) 200 MCG tablet, Take 1 tablet (200 mcg total) by mouth daily., Disp: 30 tablet, Rfl: 0 .  omeprazole (PRILOSEC) 20 MG capsule, Take 1 capsule (20 mg total) by mouth 2 (two) times daily before a meal., Disp: 60 capsule, Rfl: 2 .  traZODone (DESYREL) 50 MG tablet, Take 1 tablet (50 mg total) by mouth at bedtime as needed., Disp: 30 tablet, Rfl: 2 .  valACYclovir (VALTREX) 1000 MG tablet, Take 1 tablet by mouth as needed., Disp: , Rfl:  .  fluticasone (FLONASE) 50 MCG/ACT nasal spray, Place 2 sprays into both nostrils daily., Disp: 16 g, Rfl: 6  No Known Allergies   ROS  Constitutional: Negative for fever , positive for weight change.  Respiratory: Positive for cough, no  shortness of breath.   Cardiovascular: Negative for chest pain or palpitations.  Gastrointestinal: Negative for abdominal pain, no bowel changes.  Musculoskeletal: Negative for gait problem or joint swelling.  Skin: Negative for rash.  Neurological: Negative for dizziness or headache.  No other specific  complaints in a complete review of systems (except as listed in HPI above).  Objective  Filed Vitals:   02/24/15 0937  BP: 118/76  Pulse: 101  Temp: 98.2 F (36.8 C)  TempSrc: Oral  Resp: 18  Height: 5\' 8"  (1.727 m)  Weight: 261 lb (118.389 kg)  SpO2: 96%    Body mass index is 39.69 kg/(m^2).  Physical Exam  Constitutional: Patient appears well-developed and well-nourished. No distress.  HENT: Head: Normocephalic and atraumatic. Ears: B TMs ok, no erythema or effusion; Nose: Nose normal. Mouth/Throat: Oropharynx is clear and moist. No oropharyngeal exudate.  Eyes: Conjunctivae and EOM are normal. Pupils are equal, round, and reactive to light. No scleral icterus.  Neck: Normal range of motion. Neck supple. No JVD present. No thyromegaly present.  Cardiovascular: Normal rate, regular rhythm and normal heart sounds.  No murmur heard. No BLE edema. Pulmonary/Chest: Effort normal and breath sounds normal. No respiratory distress. Abdominal: Soft. Bowel sounds are normal, no distension. There is no tenderness. no masses Breast: no lumps or masses, no nipple discharge or rashes FEMALE GENITALIA:  External genitalia normal External urethra normal Vaginal vault normal without discharge or lesions No cervix, s/p hysterectomy RECTAL: not done Musculoskeletal: Normal range of motion, no joint effusions. No gross deformities Neurological: he is alert and oriented to person, place, and time. No cranial nerve deficit. Coordination, balance, strength, speech and gait are normal.  Skin: Skin is warm and dry. No rash noted. No erythema.  Psychiatric: Patient has a normal mood and affect. behavior is normal. Judgment and thought content normal.  Recent Results (from the past 2160 hour(s))  Urine culture     Status: None   Collection Time: 01/29/15 12:00 AM  Result Value Ref Range   Urine Culture, Routine Final report    Urine Culture result 1 Comment     Comment: Mixed urogenital  flora 10,000-25,000 colony forming units per mL   POCT Urinalysis Dipstick     Status: None   Collection Time: 01/29/15 11:08 AM  Result Value Ref Range   Color, UA dark yellow    Clarity, UA cloudy    Glucose, UA neg    Bilirubin, UA neg    Ketones, UA neg    Spec Grav, UA 1.015    Blood, UA neg    pH, UA 5.0    Protein, UA trace    Urobilinogen, UA negative    Nitrite, UA neg    Leukocytes, UA Negative Negative     PHQ2/9: Depression screen PHQ 2/9 12/31/2014  Decreased Interest 0  Down, Depressed, Hopeless 1  PHQ - 2 Score 1    Fall Risk: Fall Risk  12/31/2014  Falls in the past year? No    Assessment & Plan  1. Well woman exam  Discussed importance of 150 minutes of physical activity weekly, eat two servings of  fish weekly, eat one serving of tree nuts ( cashews, pistachios, pecans, almonds.Marland Kitchen) every other day, eat 6 servings of fruit/vegetables daily and drink plenty of water and avoid sweet beverages.   2. Routine screening for STI (sexually transmitted infection)  - HIV antibody - RPR - GC/chlamydia probe amp, urine  3. Colon cancer screening  - Ambulatory referral to Gastroenterology  4. Breast cancer screening  - MM Digital Screening; Future

## 2015-02-25 LAB — COMPREHENSIVE METABOLIC PANEL
ALT: 22 IU/L (ref 0–32)
AST: 12 IU/L (ref 0–40)
Albumin/Globulin Ratio: 1.3 (ref 1.1–2.5)
Albumin: 4 g/dL (ref 3.5–5.5)
Alkaline Phosphatase: 130 IU/L — ABNORMAL HIGH (ref 39–117)
BUN/Creatinine Ratio: 16 (ref 9–23)
BUN: 16 mg/dL (ref 6–24)
Bilirubin Total: <0.2 mg/dL (ref 0.0–1.2)
CALCIUM: 9.6 mg/dL (ref 8.7–10.2)
CHLORIDE: 103 mmol/L (ref 96–106)
CO2: 24 mmol/L (ref 18–29)
CREATININE: 0.98 mg/dL (ref 0.57–1.00)
GFR, EST AFRICAN AMERICAN: 76 mL/min/{1.73_m2} (ref >59)
GFR, EST NON AFRICAN AMERICAN: 66 mL/min/{1.73_m2} (ref >59)
GLUCOSE: 84 mg/dL (ref 65–99)
Globulin, Total: 3.2 g/dL (ref 1.5–4.5)
Potassium: 4.3 mmol/L (ref 3.5–5.2)
Sodium: 144 mmol/L (ref 134–144)
TOTAL PROTEIN: 7.2 g/dL (ref 6.0–8.5)

## 2015-02-25 LAB — LIPID PANEL
CHOL/HDL RATIO: 4.3 ratio (ref 0.0–4.4)
Cholesterol, Total: 218 mg/dL — ABNORMAL HIGH (ref 100–199)
HDL: 51 mg/dL (ref >39)
LDL CALC: 134 mg/dL — AB (ref 0–99)
Triglycerides: 167 mg/dL — ABNORMAL HIGH (ref 0–149)
VLDL CHOLESTEROL CAL: 33 mg/dL (ref 5–40)

## 2015-02-25 LAB — CBC WITH DIFFERENTIAL/PLATELET
BASOS ABS: 0.1 10*3/uL (ref 0.0–0.2)
Basos: 1 %
EOS (ABSOLUTE): 0 10*3/uL (ref 0.0–0.4)
Eos: 0 %
HEMOGLOBIN: 11.5 g/dL (ref 11.1–15.9)
Hematocrit: 36.2 % (ref 34.0–46.6)
IMMATURE GRANS (ABS): 0.1 10*3/uL (ref 0.0–0.1)
IMMATURE GRANULOCYTES: 1 %
LYMPHS: 36 %
Lymphocytes Absolute: 3.5 10*3/uL — ABNORMAL HIGH (ref 0.7–3.1)
MCH: 25.6 pg — ABNORMAL LOW (ref 26.6–33.0)
MCHC: 31.8 g/dL (ref 31.5–35.7)
MCV: 81 fL (ref 79–97)
MONOCYTES: 6 %
Monocytes Absolute: 0.6 10*3/uL (ref 0.1–0.9)
NEUTROS PCT: 56 %
Neutrophils Absolute: 5.6 10*3/uL (ref 1.4–7.0)
PLATELETS: 445 10*3/uL — AB (ref 150–379)
RBC: 4.49 x10E6/uL (ref 3.77–5.28)
RDW: 16.2 % — ABNORMAL HIGH (ref 12.3–15.4)
WBC: 9.8 10*3/uL (ref 3.4–10.8)

## 2015-02-25 LAB — TSH: TSH: 8.5 u[IU]/mL — AB (ref 0.450–4.500)

## 2015-02-25 LAB — VITAMIN D 25 HYDROXY (VIT D DEFICIENCY, FRACTURES): Vit D, 25-Hydroxy: 16.9 ng/mL — ABNORMAL LOW (ref 30.0–100.0)

## 2015-02-26 LAB — RPR QUALITATIVE: RPR: NONREACTIVE

## 2015-02-27 LAB — SPECIMEN STATUS REPORT

## 2015-02-27 LAB — GC/CHLAMYDIA PROBE AMP
CHLAMYDIA, DNA PROBE: NEGATIVE
NEISSERIA GONORRHOEAE BY PCR: NEGATIVE

## 2015-03-01 ENCOUNTER — Other Ambulatory Visit: Payer: Self-pay | Admitting: Family Medicine

## 2015-03-01 ENCOUNTER — Telehealth: Payer: Self-pay | Admitting: Gastroenterology

## 2015-03-01 ENCOUNTER — Other Ambulatory Visit: Payer: Self-pay

## 2015-03-01 LAB — PLEASE NOTE

## 2015-03-01 MED ORDER — VITAMIN D (ERGOCALCIFEROL) 1.25 MG (50000 UNIT) PO CAPS: 50000.0000 [IU] | ORAL_CAPSULE | ORAL | Status: DC

## 2015-03-01 NOTE — Telephone Encounter (Signed)
Gastroenterology Pre-Procedure Review  Request Date: Requesting Physician: Dr.   PATIENT REVIEW QUESTIONS: The patient responded to the following health history questions as indicated:    1. Are you having any GI issues? no 2. Do you have a personal history of Polyps? no 3. Do you have a family history of Colon Cancer or Polyps? no 4. Diabetes Mellitus? no 5. Joint replacements in the past 12 months?no 6. Major health problems in the past 3 months?no 7. Any artificial heart valves, MVP, or defibrillator?no    MEDICATIONS & ALLERGIES:    Patient reports the following regarding taking any anticoagulation/antiplatelet therapy:   Plavix, Coumadin, Eliquis, Xarelto, Lovenox, Pradaxa, Brilinta, or Effient? no Aspirin? no  Patient confirms/reports the following medications:  Current Outpatient Prescriptions  Medication Sig Dispense Refill   amphetamine-dextroamphetamine (ADDERALL XR) 15 MG 24 hr capsule Take 1 capsule by mouth every morning. 30 capsule 0   baclofen (LIORESAL) 10 MG tablet Take 1 tablet (10 mg total) by mouth as needed. 30 each 2   benzonatate (TESSALON) 100 MG capsule Take 1-2 capsules (100-200 mg total) by mouth 2 (two) times daily as needed for cough. 40 capsule 0   buPROPion (WELLBUTRIN XL) 150 MG 24 hr tablet Take 1 tablet (150 mg total) by mouth daily. 30 tablet 2   citalopram (CELEXA) 40 MG tablet Take 1 tablet (40 mg total) by mouth daily. 30 tablet 2   fluticasone (FLONASE) 50 MCG/ACT nasal spray Place 2 sprays into both nostrils daily. 16 g 6   ibuprofen (ADVIL,MOTRIN) 600 MG tablet Take 1 tablet (600 mg total) by mouth 3 (three) times daily. 90 tablet 0   levothyroxine (SYNTHROID) 200 MCG tablet Take 1 tablet (200 mcg total) by mouth daily. 30 tablet 0   omeprazole (PRILOSEC) 20 MG capsule Take 1 capsule (20 mg total) by mouth 2 (two) times daily before a meal. 60 capsule 2   traZODone (DESYREL) 50 MG tablet Take 1 tablet (50 mg total) by mouth at bedtime  as needed. 30 tablet 2   valACYclovir (VALTREX) 1000 MG tablet Take 1 tablet by mouth as needed.     Vitamin D, Ergocalciferol, (DRISDOL) 50000 UNITS CAPS capsule Take 1 capsule (50,000 Units total) by mouth every 7 (seven) days. 12 capsule 0   No current facility-administered medications for this visit.    Patient confirms/reports the following allergies:  No Known Allergies  No orders of the defined types were placed in this encounter.    AUTHORIZATION INFORMATION Primary Insurance: 1D#: Group #:  Secondary Insurance: 1D#: Group #:  SCHEDULE INFORMATION: Date:  Time: Location:

## 2015-03-03 ENCOUNTER — Other Ambulatory Visit: Payer: Self-pay | Admitting: Family Medicine

## 2015-03-03 LAB — SPECIMEN STATUS REPORT

## 2015-03-24 ENCOUNTER — Other Ambulatory Visit: Payer: Self-pay | Admitting: Family Medicine

## 2015-03-25 ENCOUNTER — Encounter: Payer: Self-pay | Admitting: *Deleted

## 2015-03-26 ENCOUNTER — Encounter: Payer: Self-pay | Admitting: Anesthesiology

## 2015-03-26 NOTE — Anesthesia Preprocedure Evaluation (Deleted)
Anesthesia Evaluation Anesthesia Physical Anesthesia Plan Anesthesia Quick Evaluation  

## 2015-03-27 ENCOUNTER — Other Ambulatory Visit: Payer: Self-pay | Admitting: Family Medicine

## 2015-03-30 NOTE — Discharge Instructions (Signed)

## 2015-03-31 ENCOUNTER — Other Ambulatory Visit: Payer: Self-pay | Admitting: Family Medicine

## 2015-03-31 ENCOUNTER — Encounter: Payer: Self-pay | Admitting: Family Medicine

## 2015-03-31 ENCOUNTER — Ambulatory Visit (INDEPENDENT_AMBULATORY_CARE_PROVIDER_SITE_OTHER): Payer: 59 | Admitting: Family Medicine

## 2015-03-31 VITALS — BP 126/84 | HR 105 | Temp 98.3°F | Resp 18 | Wt 261.6 lb

## 2015-03-31 DIAGNOSIS — R059 Cough, unspecified: Secondary | ICD-10-CM

## 2015-03-31 DIAGNOSIS — E785 Hyperlipidemia, unspecified: Secondary | ICD-10-CM | POA: Diagnosis not present

## 2015-03-31 DIAGNOSIS — E038 Other specified hypothyroidism: Secondary | ICD-10-CM

## 2015-03-31 DIAGNOSIS — G4700 Insomnia, unspecified: Secondary | ICD-10-CM | POA: Diagnosis not present

## 2015-03-31 DIAGNOSIS — K219 Gastro-esophageal reflux disease without esophagitis: Secondary | ICD-10-CM

## 2015-03-31 DIAGNOSIS — R05 Cough: Secondary | ICD-10-CM | POA: Diagnosis not present

## 2015-03-31 DIAGNOSIS — G4733 Obstructive sleep apnea (adult) (pediatric): Secondary | ICD-10-CM

## 2015-03-31 DIAGNOSIS — M797 Fibromyalgia: Secondary | ICD-10-CM

## 2015-03-31 DIAGNOSIS — F329 Major depressive disorder, single episode, unspecified: Secondary | ICD-10-CM

## 2015-03-31 MED ORDER — FLUTICASONE FUROATE-VILANTEROL 100-25 MCG/INH IN AEPB: 1.0000 | INHALATION_SPRAY | Freq: Every day | RESPIRATORY_TRACT | Status: DC

## 2015-03-31 MED ORDER — AMPHETAMINE-DEXTROAMPHET ER 15 MG PO CP24: 15.0000 mg | ORAL_CAPSULE | ORAL | Status: DC

## 2015-03-31 MED ORDER — BUPROPION HCL ER (XL) 150 MG PO TB24: 150.0000 mg | ORAL_TABLET | Freq: Every day | ORAL | Status: DC

## 2015-03-31 MED ORDER — CITALOPRAM HYDROBROMIDE 40 MG PO TABS: 40.0000 mg | ORAL_TABLET | Freq: Every day | ORAL | Status: DC

## 2015-03-31 NOTE — Progress Notes (Signed)
Name: Debra Soto   MRN: [id number]    DOB: 08-28-1960   Date:03/31/2015       Progress Note  Subjective  Chief Complaint  Chief Complaint  Patient presents with  . Follow-up    patient is here for a 79-month f/u  . Medication Refill    adderall XR 15mg     HPI  Hypothyroidism: back on medication since Fall 2016. She is compliant with medication now, due for repeat TSH in 2 weeks.   Major Depression: she is back on Citalopram, Wellbutrin and Adderal. She states she is feeling better, she has more energy and motivation, no crying spells, she still has interrupted sleep - wakes up between 3-4 am and takes a couple of hours to fall back asleep. Discussed importance of not going back to bed. Try to go to be earlier. She states that she has not been taking Trazodone because it does not work. She does not want to try any other medications at this time  Bowel Changes: she has a colonoscopy scheduled for next week.   FMS: she has more energy now, she still has generalized pain about 2/10 pain.   Dyslipidemia: I discussed ASCVD and is low at 2.1% no need for statin therapy   URI: she has been sick for weeks, states started with husband, followed by son and she has rhinorrhea, nasal congestion. She had bronchitis and was treated, symptoms improved, but she still has a cough - dry cough. Likely post-bronchial. No fever. She does not want to take an inhaler   Patient Active Problem List   Diagnosis Date Noted  . Endometriosis 12/31/2014  . Insomnia, persistent 09/29/2014  . IBS (irritable bowel syndrome) 09/29/2014  . 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 Dyslipidemia 09/29/2014  . Fibromyalgia syndrome 09/29/2014  . Gastro-esophageal reflux disease without esophagitis 09/29/2014  . Herpes labialis 09/29/2014  . Adult hypothyroidism 09/29/2014  . Family history of malignant neoplasm of breast 09/29/2014  . Extreme obesity ([place]) 09/29/2014  . Major depressive episode 09/29/2014  .  Obstructive apnea 09/29/2014  . Abnormal presence of protein in urine 09/29/2014  . Allergic rhinitis 09/29/2014  . Vitamin D deficiency 09/29/2014  . Urge incontinence 09/29/2014  . Headache, tension-type 09/29/2014  . Menopausal symptom 09/29/2014    Past Surgical History  Procedure Laterality Date  . Cholecystectomy  [id number]  . Abdominal hysterectomy  [id number]  . Tonsilectomy, adenoidectomy, bilateral myringotomy and tubes    . Lipoma excision    . Bladder tact    . Colonoscopy    . Esophagogastroduodenoscopy      Family History  Problem Relation Age of Onset  . Hypertension Mother   . Arthritis Mother   . Cancer Mother     ovarian,breast, lung, liver  . Diabetes Brother   . Hypertension Brother   . Other Father     lung problems    Social History   Social History  . Marital Status: Married    Spouse Name: N/A  . Number of Children: N/A  . Years of Education: N/A   Occupational History  . Not on file.   Social History Main Topics  . Smoking status: Never Smoker   . Smokeless tobacco: Never Used  . Alcohol Use: 0.0 oz/week    0 Standard drinks or equivalent per week     Comment: rarely  . Drug Use: No  . Sexual Activity: Yes   Other Topics Concern  . Not on file   Social History Narrative  Current outpatient prescriptions:  .  amphetamine-dextroamphetamine (ADDERALL XR) 15 MG 24 hr capsule, Take 1 capsule by mouth every morning., Disp: 30 capsule, Rfl: 0 .  baclofen (LIORESAL) 10 MG tablet, Take 1 tablet (10 mg total) by mouth as needed., Disp: 30 each, Rfl: 2 .  benzonatate (TESSALON) 100 MG capsule, Take 1-2 capsules (100-200 mg total) by mouth 2 (two) times daily as needed for cough., Disp: 40 capsule, Rfl: 0 .  buPROPion (WELLBUTRIN XL) 150 MG 24 hr tablet, Take 1 tablet (150 mg total) by mouth daily., Disp: 30 tablet, Rfl: 2 .  citalopram (CELEXA) 40 MG tablet, Take 1 tablet (40 mg total) by mouth daily., Disp: 30 tablet, Rfl: 2 .   fluticasone (FLONASE) 50 MCG/ACT nasal spray, Place 2 sprays into both nostrils daily., Disp: 16 g, Rfl: 6 .  ibuprofen (ADVIL,MOTRIN) 600 MG tablet, take 1 tablet by mouth three times a day (Patient taking differently: BID), Disp: 90 tablet, Rfl: 1 .  levothyroxine (SYNTHROID, LEVOTHROID) 200 MCG tablet, take 1 tablet by mouth daily, Disp: 30 tablet, Rfl: 0 .  omeprazole (PRILOSEC) 20 MG capsule, take 1 capsule by mouth twice a day BEFORE A MEAL, Disp: 60 capsule, Rfl: 2 .  traZODone (DESYREL) 50 MG tablet, Take 1 tablet (50 mg total) by mouth at bedtime as needed., Disp: 30 tablet, Rfl: 2 .  valACYclovir (VALTREX) 1000 MG tablet, Take 1 tablet by mouth as needed., Disp: , Rfl:  .  Vitamin D, Ergocalciferol, (DRISDOL) 50000 UNITS CAPS capsule, Take 1 capsule (50,000 Units total) by mouth every 7 (seven) days., Disp: 12 capsule, Rfl: 0  No Known Allergies   ROS  Constitutional: Negative for fever or weight change.  Respiratory: Positive  for cough no  shortness of breath.   Cardiovascular: Negative for chest pain or palpitations.  Gastrointestinal: Negative for abdominal pain, positive  bowel changes.  Musculoskeletal: Negative for gait problem or joint swelling.  Skin: Negative for rash.  Neurological: Negative for dizziness or headache.  No other specific complaints in a complete review of systems (except as listed in HPI above).  Objective  Filed Vitals:   03/31/15 0921  BP: 126/84  Pulse: 105  Temp: 98.3 F (36.8 C)  TempSrc: Oral  Resp: 18  Weight: 261 lb 9.6 oz (118.661 kg)  SpO2: 95%    Body mass index is 39.79 kg/(m^2).  Physical Exam  Constitutional: Patient appears well-developed and well-nourished. Obese  No distress.  HEENT: head atraumatic, normocephalic, pupils equal and reactive to light, neck supple, throat within normal limits. No thyromegaly Cardiovascular: Normal rate, regular rhythm and normal heart sounds.  No murmur heard. No BLE  edema. Pulmonary/Chest: Effort normal and breath sounds normal. No respiratory distress. Abdominal: Soft.  There is no tenderness. Psychiatric: Patient has a normal mood and affect. behavior is normal. Judgment and thought content normal. Muscular Skeletal: trigger points negative  Recent Results (from the past 2160 hour(s))  Urine culture     Status: None   Collection Time: 01/29/15 12:00 AM  Result Value Ref Range   Urine Culture, Routine Final report    Urine Culture result 1 Comment     Comment: Mixed urogenital flora 10,000-25,000 colony forming units per mL   POCT Urinalysis Dipstick     Status: None   Collection Time: 01/29/15 11:08 AM  Result Value Ref Range   Color, UA dark yellow    Clarity, UA cloudy    Glucose, UA neg    Bilirubin, UA  neg    Ketones, UA neg    Spec Grav, UA 1.015    Blood, UA neg    pH, UA 5.0    Protein, UA trace    Urobilinogen, UA negative    Nitrite, UA neg    Leukocytes, UA Negative Negative  Please Note     Status: None   Collection Time: 02/24/15 12:00 AM  Result Value Ref Range   Please note Comment     Comment: The date and/or time of collection was not indicated on the requisition as required by state and federal law.  The date of receipt of the specimen was used as the collection date if not supplied.   Specimen status report     Status: None   Collection Time: 02/24/15 12:00 AM  Result Value Ref Range   specimen status report Comment     Comment: Written Authorization Written Authorization Written Authorization Received. Authorization received from [place] 02-25-2015 Logged by Paul Dykes   GC/Chlamydia Probe Amp     Status: None   Collection Time: 02/24/15 12:00 AM  Result Value Ref Range   Chlamydia trachomatis, NAA Negative Negative   Neisseria gonorrhoeae by PCR Negative Negative  CBC with Differential/Platelet     Status: Abnormal   Collection Time: 02/24/15  9:52 AM  Result Value Ref Range   WBC 9.8 3.4  - 10.8 x10E3/uL   RBC 4.49 3.77 - 5.28 x10E6/uL   Hemoglobin 11.5 11.1 - 15.9 g/dL   Hematocrit 36.2 34.0 - 46.6 %   MCV 81 79 - 97 fL   MCH 25.6 (L) 26.6 - 33.0 pg   MCHC 31.8 31.5 - 35.7 g/dL   RDW 16.2 (H) 12.3 - 15.4 %   Platelets 445 (H) 150 - 379 x10E3/uL   Neutrophils 56 %   Lymphs 36 %   Monocytes 6 %   Eos 0 %   Basos 1 %   Neutrophils Absolute 5.6 1.4 - 7.0 x10E3/uL   Lymphocytes Absolute 3.5 (H) 0.7 - 3.1 x10E3/uL   Monocytes Absolute 0.6 0.1 - 0.9 x10E3/uL   EOS (ABSOLUTE) 0.0 0.0 - 0.4 x10E3/uL   Basophils Absolute 0.1 0.0 - 0.2 x10E3/uL   Immature Granulocytes 1 %   Immature Grans (Abs) 0.1 0.0 - 0.1 x10E3/uL  Comprehensive metabolic panel     Status: Abnormal   Collection Time: 02/24/15  9:52 AM  Result Value Ref Range   Glucose 84 65 - 99 mg/dL   BUN 16 6 - 24 mg/dL   Creatinine, Ser 0.98 0.57 - 1.00 mg/dL   GFR calc non Af Amer 66 >59 mL/min/1.73   GFR calc Af Amer 76 >59 mL/min/1.73   BUN/Creatinine Ratio 16 9 - 23   Sodium 144 134 - 144 mmol/L    Comment:               **Please note reference interval change**   Potassium 4.3 3.5 - 5.2 mmol/L   Chloride 103 96 - 106 mmol/L    Comment:               **Please note reference interval change**   CO2 24 18 - 29 mmol/L   Calcium 9.6 8.7 - 10.2 mg/dL   Total Protein 7.2 6.0 - 8.5 g/dL   Albumin 4.0 3.5 - 5.5 g/dL   Globulin, Total 3.2 1.5 - 4.5 g/dL   Albumin/Globulin Ratio 1.3 1.1 - 2.5   Bilirubin Total <0.2 0.0 - 1.2 mg/dL   Alkaline  Phosphatase 130 (H) 39 - 117 IU/L   AST 12 0 - 40 IU/L   ALT 22 0 - 32 IU/L  Lipid panel     Status: Abnormal   Collection Time: 02/24/15  9:52 AM  Result Value Ref Range   Cholesterol, Total 218 (H) 100 - 199 mg/dL   Triglycerides 167 (H) 0 - 149 mg/dL   HDL 51 >39 mg/dL   VLDL Cholesterol Cal 33 5 - 40 mg/dL   LDL Calculated 134 (H) 0 - 99 mg/dL   Chol/HDL Ratio 4.3 0.0 - 4.4 ratio units    Comment:                                   T. Chol/HDL Ratio                                              Men  Women                               1/2 Avg.Risk  3.4    3.3                                   Avg.Risk  5.0    4.4                                2X Avg.Risk  9.6    7.1                                3X Avg.Risk 23.4   11.0   TSH     Status: Abnormal   Collection Time: 02/24/15  9:52 AM  Result Value Ref Range   TSH 8.500 (H) 0.450 - 4.500 uIU/mL  VITAMIN D 25 Hydroxy (Vit-D Deficiency, Fractures)     Status: Abnormal   Collection Time: 02/24/15  9:52 AM  Result Value Ref Range   Vit D, 25-Hydroxy 16.9 (L) 30.0 - 100.0 ng/mL    Comment: Vitamin D deficiency has been defined by the [place] practice guideline as a level of serum 25-OH vitamin D less than 20 ng/mL (1,2). The Endocrine Society went on to further define vitamin D insufficiency as a level between 21 and 29 ng/mL (2). 1. IOM (Institute of Medicine). 2010. Dietary reference    intakes for calcium and D. [place]: The    [employer]. 2. Holick MF, [place], Bischoff-Ferrari HA, et al.    Evaluation, treatment, and prevention of vitamin D    deficiency: an Endocrine Society clinical practice    guideline. JCEM. 2011 Jul; 96(7):1911-30.   RPR Qual     Status: None   Collection Time: 02/24/15  9:52 AM  Result Value Ref Range   RPR Ser Ql Non Reactive Non Reactive  Specimen status report     Status: None   Collection Time: 02/24/15  9:52 AM  Result Value Ref Range   specimen status report Comment     Comment: Written Authorization Written Authorization Written Authorization  Received. Authorization received from [place] 03-03-2015 Logged by Lenice Llamas      PHQ2/9: Depression screen [facility] 2/9 03/31/2015 12/31/2014  Decreased Interest 0 0  Down, Depressed, Hopeless 1 1  PHQ - 2 Score 1 1    Fall Risk: Fall Risk  03/31/2015 12/31/2014  Falls in the past year? No No     Functional Status Survey: Is the patient  deaf or have difficulty hearing?: No Does the patient have difficulty seeing, even when wearing glasses/contacts?: No Does the patient have difficulty concentrating, remembering, or making decisions?: No Does the patient have difficulty walking or climbing stairs?: No Does the patient have difficulty dressing or bathing?: No Does the patient have difficulty doing errands alone such as visiting a doctor's office or shopping?: No    Assessment & Plan  1. Dyslipidemia  Low aSCVD, no need for statins  2. Major depressive episode  Doing better, continue medication  - buPROPion (WELLBUTRIN XL) 150 MG 24 hr tablet; Take 1 tablet (150 mg total) by mouth daily.  Dispense: 30 tablet; Refill: 2 - citalopram (CELEXA) 40 MG tablet; Take 1 tablet (40 mg total) by mouth daily.  Dispense: 30 tablet; Refill: 2 - amphetamine-dextroamphetamine (ADDERALL XR) 15 MG 24 hr capsule; Take 1 capsule by mouth every morning.  Dispense: 30 capsule; Refill: 0 - amphetamine-dextroamphetamine (ADDERALL XR) 15 MG 24 hr capsule; Take 1 capsule by mouth every morning.  Dispense: 30 capsule; Refill: 0 - amphetamine-dextroamphetamine (ADDERALL XR) 15 MG 24 hr capsule; Take 1 capsule by mouth every morning.  Dispense: 30 capsule; Refill: 0  3. Gastro-esophageal reflux disease without esophagitis  Doing well at this time  4. Fibromyalgia syndrome  Stable, taking Baclofen prn   5. Cough  Likely post-bronchial. She agreed on trying Breo and will call back for CXR if no resolution of symptoms in the next couple of weeks - Fluticasone Furoate-Vilanterol (BREO ELLIPTA) 100-25 MCG/INH AEPB; Inhale 1 puff into the lungs daily.  Dispense: 60 each; Refill: 0  6. Obstructive apnea  Not wearing CPAP, explained it should make her feel much better  7. Insomnia, persistent  Not on medication, discussed sleep hygiene, can try to stay up from 4 am on and go to bed earlier.

## 2015-04-02 ENCOUNTER — Ambulatory Visit: Admission: RE | Admit: 2015-04-02 | Payer: 59 | Source: Ambulatory Visit | Admitting: Gastroenterology

## 2015-04-02 SURGERY — COLONOSCOPY WITH PROPOFOL
Anesthesia: Choice

## 2015-05-21 ENCOUNTER — Other Ambulatory Visit: Payer: Self-pay | Admitting: Family Medicine

## 2015-05-21 NOTE — Telephone Encounter (Signed)
Patient requesting refill. 

## 2015-06-28 ENCOUNTER — Other Ambulatory Visit: Payer: Self-pay | Admitting: Family Medicine

## 2015-06-28 NOTE — Telephone Encounter (Signed)
I need another lab - TSH - already ordered to decide if she is taking the right amount of Levothyroxine

## 2015-06-28 NOTE — Telephone Encounter (Signed)
Patient requesting refill. 

## 2015-06-28 NOTE — Telephone Encounter (Signed)
Left message for patient

## 2015-07-01 ENCOUNTER — Ambulatory Visit: Payer: 59 | Admitting: Family Medicine

## 2015-07-02 NOTE — Telephone Encounter (Signed)
Patient had a appointment but did not show for it and I have called her several times with no answer or response back.

## 2015-07-05 ENCOUNTER — Telehealth: Payer: Self-pay

## 2015-07-05 DIAGNOSIS — E038 Other specified hypothyroidism: Secondary | ICD-10-CM

## 2015-07-05 MED ORDER — LEVOTHYROXINE SODIUM 200 MCG PO TABS: 200.0000 ug | ORAL_TABLET | Freq: Every day | ORAL | Status: DC

## 2015-07-05 NOTE — Telephone Encounter (Signed)
Contacted patient to inform her that we would only send in a short supply of the requested medication due to her missed appt. She informed me that her mother-in-law fell and had to have surgery so she has been in [place]. Patient was advised that an order has been placed for her TSH and that she should make a follow-up appt as soon as she returns. She stated that she will be back in town on Sunday.

## 2015-07-22 ENCOUNTER — Encounter: Payer: Self-pay | Admitting: Family Medicine

## 2015-07-22 ENCOUNTER — Ambulatory Visit (INDEPENDENT_AMBULATORY_CARE_PROVIDER_SITE_OTHER): Payer: 59 | Admitting: Family Medicine

## 2015-07-22 VITALS — BP 134/88 | HR 106 | Temp 98.5°F | Resp 18 | Ht 68.0 in | Wt 260.4 lb

## 2015-07-22 DIAGNOSIS — E559 Vitamin D deficiency, unspecified: Secondary | ICD-10-CM | POA: Diagnosis not present

## 2015-07-22 DIAGNOSIS — F329 Major depressive disorder, single episode, unspecified: Secondary | ICD-10-CM | POA: Diagnosis not present

## 2015-07-22 DIAGNOSIS — R739 Hyperglycemia, unspecified: Secondary | ICD-10-CM | POA: Diagnosis not present

## 2015-07-22 DIAGNOSIS — K219 Gastro-esophageal reflux disease without esophagitis: Secondary | ICD-10-CM

## 2015-07-22 DIAGNOSIS — Z79899 Other long term (current) drug therapy: Secondary | ICD-10-CM | POA: Diagnosis not present

## 2015-07-22 DIAGNOSIS — E785 Hyperlipidemia, unspecified: Secondary | ICD-10-CM | POA: Diagnosis not present

## 2015-07-22 DIAGNOSIS — G4700 Insomnia, unspecified: Secondary | ICD-10-CM | POA: Diagnosis not present

## 2015-07-22 DIAGNOSIS — M797 Fibromyalgia: Secondary | ICD-10-CM

## 2015-07-22 DIAGNOSIS — D75839 Thrombocytosis, unspecified: Secondary | ICD-10-CM

## 2015-07-22 DIAGNOSIS — D473 Essential (hemorrhagic) thrombocythemia: Secondary | ICD-10-CM

## 2015-07-22 DIAGNOSIS — E038 Other specified hypothyroidism: Secondary | ICD-10-CM | POA: Diagnosis not present

## 2015-07-22 MED ORDER — OMEPRAZOLE 20 MG PO CPDR
DELAYED_RELEASE_CAPSULE | ORAL | Status: DC
Start: 2015-07-22 — End: 2015-10-22

## 2015-07-22 MED ORDER — LEVOTHYROXINE SODIUM 200 MCG PO TABS: 200.0000 ug | ORAL_TABLET | Freq: Every day | ORAL | Status: DC

## 2015-07-22 MED ORDER — BUPROPION HCL ER (XL) 300 MG PO TB24
300.0000 mg | ORAL_TABLET | Freq: Every day | ORAL | Status: DC
Start: 2015-07-22 — End: 2015-10-22

## 2015-07-22 MED ORDER — CITALOPRAM HYDROBROMIDE 40 MG PO TABS: 40.0000 mg | ORAL_TABLET | Freq: Every day | ORAL | Status: DC

## 2015-07-22 MED ORDER — AMPHETAMINE-DEXTROAMPHET ER 15 MG PO CP24: 15.0000 mg | ORAL_CAPSULE | ORAL | Status: DC

## 2015-07-22 MED ORDER — HYDROCODONE-ACETAMINOPHEN 10-325 MG PO TABS: 1.0000 | ORAL_TABLET | Freq: Four times a day (QID) | ORAL | Status: DC | PRN

## 2015-07-22 MED ORDER — IBUPROFEN 600 MG PO TABS: 600.0000 mg | ORAL_TABLET | Freq: Two times a day (BID) | ORAL | Status: DC

## 2015-07-22 NOTE — Progress Notes (Signed)
Name: Debra Soto   MRN: [id number]    DOB: 12-17-1960   Date:07/22/2015       Progress Note  Subjective  Chief Complaint  Chief Complaint  Patient presents with  . Medication Refill    4 month F/U  . Hypothyroidism    Has not been able to have her labs drawn due to her mother in law having a fall and breaking her right arm in [place], then once she got back she has been overwhelmed by work. Patient has been without medication due to not having her labs drawn and has been very fatigue.   . Depression    Has been stressed due to work overloading patient with work and mother in law having the fall with going to take care of her. Patient has been without her Adderall medication for a while as well too.   . Gastroesophageal Reflux    Controlled with daily medication, states if she even misses one it is noticable.   . Fibromyalgia    Has had to use pain medication due to pain being constant in the past couple of weeks    HPI  Hypothyroidism: back on medication since Fall 2016 and was compliant, but had to go to [place] to take care of her mother-in-law that fell and has been without medication for the past couple of weeks. She is feeling very tired, mood changes, change in bowel movements  Major Depression: she is back on Citalopram, Wellbutrin and Adderal. She states she was feeling better but has been feeling worse again over the past few weeks ( also out of Synthroid ) , she has lack of  energy and motivation again, having  crying spells again, she still has interrupted sleep  She did not respond to Trazodone, but  does not want to try any other medications at this time  Bowel Changes: she had a colonoscopy scheduled but got sick and had to cancel, still feeling bloated, bowel movements from constipation to diarrhea, she needs to re-schedule appointment   FMS: only taking Ibuprofen for FMS at this time, she takes hydrocodone very seldom. Pain right now is 5-6/10 all over her  body  Dyslipidemia: I discussed ASCVD and is low at 2.1% no need for statin therapy.   GERD: she is taking Omeprazole twice daily and keeps symptoms under control. Discussed risk of Alzheimer's, c.difi colitis and she will try weaning self off.    Patient Active Problem List   Diagnosis Date Noted  . Endometriosis 12/31/2014  . Insomnia, persistent 09/29/2014  . IBS (irritable bowel syndrome) 09/29/2014  . 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 Dyslipidemia 09/29/2014  . Fibromyalgia syndrome 09/29/2014  . Gastro-esophageal reflux disease without esophagitis 09/29/2014  . Herpes labialis 09/29/2014  . Adult hypothyroidism 09/29/2014  . Family history of malignant neoplasm of breast 09/29/2014  . Extreme obesity ([place]) 09/29/2014  . Major depressive episode 09/29/2014  . Obstructive apnea 09/29/2014  . Abnormal presence of protein in urine 09/29/2014  . Allergic rhinitis 09/29/2014  . Vitamin D deficiency 09/29/2014  . Urge incontinence 09/29/2014  . Headache, tension-type 09/29/2014  . Menopausal symptom 09/29/2014    Past Surgical History  Procedure Laterality Date  . Cholecystectomy  [id number]  . Abdominal hysterectomy  [id number]  . Tonsilectomy, adenoidectomy, bilateral myringotomy and tubes    . Lipoma excision    . Bladder tact    . Colonoscopy    . Esophagogastroduodenoscopy      Family History  Problem Relation Age of  Onset  . Hypertension Mother   . Arthritis Mother   . Cancer Mother     ovarian,breast, lung, liver  . Diabetes Brother   . Hypertension Brother   . Other Father     lung problems    Social History   Social History  . Marital Status: Married    Spouse Name: N/A  . Number of Children: N/A  . Years of Education: N/A   Occupational History  . Not on file.   Social History Main Topics  . Smoking status: Never Smoker   . Smokeless tobacco: Never Used  . Alcohol Use: 0.0 oz/week    0 Standard drinks or equivalent per week     Comment:  rarely  . Drug Use: No  . Sexual Activity: Yes   Other Topics Concern  . Not on file   Social History Narrative     Current outpatient prescriptions:  .  amphetamine-dextroamphetamine (ADDERALL XR) 15 MG 24 hr capsule, Take 1 capsule by mouth every morning., Disp: 30 capsule, Rfl: 0 .  amphetamine-dextroamphetamine (ADDERALL XR) 15 MG 24 hr capsule, Take 1 capsule by mouth every morning., Disp: 30 capsule, Rfl: 0 .  amphetamine-dextroamphetamine (ADDERALL XR) 15 MG 24 hr capsule, Take 1 capsule by mouth every morning., Disp: 30 capsule, Rfl: 0 .  baclofen (LIORESAL) 10 MG tablet, Take 1 tablet (10 mg total) by mouth as needed., Disp: 30 each, Rfl: 2 .  buPROPion (WELLBUTRIN XL) 300 MG 24 hr tablet, Take 1 tablet (300 mg total) by mouth daily., Disp: 30 tablet, Rfl: 2 .  Ca Phosphate-Cholecalciferol (CALCIUM/VITAMIN D3 GUMMIES) 200-200 MG-UNIT CHEW, Chew 1 each by mouth daily., Disp: , Rfl:  .  citalopram (CELEXA) 40 MG tablet, Take 1 tablet (40 mg total) by mouth daily., Disp: 30 tablet, Rfl: 2 .  ibuprofen (ADVIL,MOTRIN) 600 MG tablet, Take 1 tablet (600 mg total) by mouth 2 (two) times daily., Disp: 60 tablet, Rfl: 2 .  levothyroxine (SYNTHROID) 200 MCG tablet, Take 1 tablet (200 mcg total) by mouth daily., Disp: 30 tablet, Rfl: 1 .  omeprazole (PRILOSEC) 20 MG capsule, take 1 capsule by mouth twice a day BEFORE A MEAL, Disp: 60 capsule, Rfl: 2 .  valACYclovir (VALTREX) 1000 MG tablet, Take 1 tablet by mouth as needed., Disp: , Rfl:  .  fluticasone (FLONASE) 50 MCG/ACT nasal spray, Place 2 sprays into both nostrils daily. (Patient not taking: Reported on 07/22/2015), Disp: 16 g, Rfl: 6  No Known Allergies   ROS  Constitutional: Negative for fever or weight change.  Respiratory: Negative for cough and shortness of breath.   Cardiovascular: Negative for chest pain or palpitations.  Gastrointestinal: Negative for abdominal pain, no bowel changes.  Musculoskeletal: Negative for gait  problem or joint swelling.  Skin: Negative for rash.  Neurological: Negative for dizziness or headache.  No other specific complaints in a complete review of systems (except as listed in HPI above).  Objective  Filed Vitals:   07/22/15 0859  BP: 134/88  Pulse: 106  Temp: 98.5 F (36.9 C)  TempSrc: Oral  Resp: 18  Height: 5\' 8"  (1.727 m)  Weight: 260 lb 6.4 oz (118.117 kg)  SpO2: 96%    Body mass index is 39.6 kg/(m^2).  Physical Exam  Constitutional: Patient appears well-developed and well-nourished. Obese  No distress.  HEENT: head atraumatic, normocephalic, pupils equal and reactive to light,  neck supple, throat within normal limits.Normal thyroid Cardiovascular: Normal rate, regular rhythm and normal heart sounds.  No murmur heard. No BLE edema. Pulmonary/Chest: Effort normal and breath sounds normal. No respiratory distress. Abdominal: Soft.  There is no tenderness. Psychiatric: Patient has a normal mood and affect. behavior is normal. Judgment and thought content normal. Muscular Skeletal: trigger points positive all over  PHQ2/9: Depression screen [facility] 2/9 03/31/2015 12/31/2014  Decreased Interest 0 0  Down, Depressed, Hopeless 1 1  PHQ - 2 Score 1 1     Fall Risk: Fall Risk  07/22/2015 03/31/2015 12/31/2014  Falls in the past year? No No No    Functional Status Survey: Is the patient deaf or have difficulty hearing?: No Does the patient have difficulty seeing, even when wearing glasses/contacts?: No Does the patient have difficulty concentrating, remembering, or making decisions?: No Does the patient have difficulty walking or climbing stairs?: No Does the patient have difficulty dressing or bathing?: No Does the patient have difficulty doing errands alone such as visiting a doctor's office or shopping?: No    Assessment & Plan  1. Other specified hypothyroidism  - levothyroxine (SYNTHROID) 200 MCG tablet; Take 1 tablet (200 mcg total) by mouth daily.   Dispense: 30 tablet; Refill: 1  2. Insomnia, persistent  Not taking medication at this time  3. Major depressive episode  - citalopram (CELEXA) 40 MG tablet; Take 1 tablet (40 mg total) by mouth daily.  Dispense: 30 tablet; Refill: 2 - buPROPion (WELLBUTRIN XL) 300 MG 24 hr tablet; Take 1 tablet (300 mg total) by mouth daily.  Dispense: 30 tablet; Refill: 2 - amphetamine-dextroamphetamine (ADDERALL XR) 15 MG 24 hr capsule; Take 1 capsule by mouth every morning.  Dispense: 30 capsule; Refill: 0 - amphetamine-dextroamphetamine (ADDERALL XR) 15 MG 24 hr capsule; Take 1 capsule by mouth every morning.  Dispense: 30 capsule; Refill: 0 - amphetamine-dextroamphetamine (ADDERALL XR) 15 MG 24 hr capsule; Take 1 capsule by mouth every morning.  Dispense: 30 capsule; Refill: 0  4. Gastro-esophageal reflux disease without esophagitis  - omeprazole (PRILOSEC) 20 MG capsule; take 1 capsule by mouth twice a day BEFORE A MEAL  Dispense: 60 capsule; Refill: 2  5. Dyslipidemia  - Lipid panel  6. Vitamin D deficiency  - VITAMIN D 25 Hydroxy (Vit-D Deficiency, Fractures)  7. Hyperglycemia  - Hemoglobin A1c  8. Long-term use of high-risk medication  - CBC with Differential/Platelet - Comprehensive metabolic panel  9. Thrombocytosis (HCC)  - CBC with Differential/Platelet  10. Fibromyalgia syndrome  - ibuprofen (ADVIL,MOTRIN) 600 MG tablet; Take 1 tablet (600 mg total) by mouth 2 (two) times daily.  Dispense: 60 tablet; Refill: 2 - HYDROcodone-acetaminophen (NORCO) 10-325 MG tablet; Take 1 tablet by mouth every 6 (six) hours as needed.  Dispense: 30 tablet; Refill: 0

## 2015-07-23 LAB — COMPREHENSIVE METABOLIC PANEL
A/G RATIO: 1.3 (ref 1.2–2.2)
ALBUMIN: 4 g/dL (ref 3.5–5.5)
ALT: 12 IU/L (ref 0–32)
AST: 13 IU/L (ref 0–40)
Alkaline Phosphatase: 126 IU/L — ABNORMAL HIGH (ref 39–117)
BUN/Creatinine Ratio: 15 (ref 9–23)
BUN: 14 mg/dL (ref 6–24)
Bilirubin Total: <0.2 mg/dL (ref 0.0–1.2)
CALCIUM: 9.2 mg/dL (ref 8.7–10.2)
CO2: 23 mmol/L (ref 18–29)
CREATININE: 0.95 mg/dL (ref 0.57–1.00)
Chloride: 102 mmol/L (ref 96–106)
GFR, EST AFRICAN AMERICAN: 78 mL/min/{1.73_m2} (ref >59)
GFR, EST NON AFRICAN AMERICAN: 68 mL/min/{1.73_m2} (ref >59)
GLOBULIN, TOTAL: 3 g/dL (ref 1.5–4.5)
Glucose: 89 mg/dL (ref 65–99)
POTASSIUM: 4.5 mmol/L (ref 3.5–5.2)
SODIUM: 142 mmol/L (ref 134–144)
TOTAL PROTEIN: 7 g/dL (ref 6.0–8.5)

## 2015-07-23 LAB — CBC WITH DIFFERENTIAL/PLATELET
Basophils Absolute: 0.1 10*3/uL (ref 0.0–0.2)
Basos: 1 %
EOS (ABSOLUTE): 0 10*3/uL (ref 0.0–0.4)
EOS: 0 %
HEMATOCRIT: 36.1 % (ref 34.0–46.6)
HEMOGLOBIN: 11.3 g/dL (ref 11.1–15.9)
IMMATURE GRANS (ABS): 0 10*3/uL (ref 0.0–0.1)
IMMATURE GRANULOCYTES: 0 %
Lymphocytes Absolute: 2.2 10*3/uL (ref 0.7–3.1)
Lymphs: 30 %
MCH: 24.9 pg — ABNORMAL LOW (ref 26.6–33.0)
MCHC: 31.3 g/dL — ABNORMAL LOW (ref 31.5–35.7)
MCV: 80 fL (ref 79–97)
MONOCYTES: 6 %
MONOS ABS: 0.4 10*3/uL (ref 0.1–0.9)
NEUTROS PCT: 63 %
Neutrophils Absolute: 4.6 10*3/uL (ref 1.4–7.0)
Platelets: 418 10*3/uL — ABNORMAL HIGH (ref 150–379)
RBC: 4.54 x10E6/uL (ref 3.77–5.28)
RDW: 16.2 % — AB (ref 12.3–15.4)
WBC: 7.3 10*3/uL (ref 3.4–10.8)

## 2015-07-23 LAB — HEMOGLOBIN A1C
ESTIMATED AVERAGE GLUCOSE: 117 mg/dL
Hgb A1c MFr Bld: 5.7 % — ABNORMAL HIGH (ref 4.8–5.6)

## 2015-07-23 LAB — LIPID PANEL
CHOLESTEROL TOTAL: 236 mg/dL — AB (ref 100–199)
Chol/HDL Ratio: 4.6 ratio units — ABNORMAL HIGH (ref 0.0–4.4)
HDL: 51 mg/dL (ref >39)
LDL Calculated: 160 mg/dL — ABNORMAL HIGH (ref 0–99)
Triglycerides: 124 mg/dL (ref 0–149)
VLDL CHOLESTEROL CAL: 25 mg/dL (ref 5–40)

## 2015-07-23 LAB — VITAMIN D 25 HYDROXY (VIT D DEFICIENCY, FRACTURES): Vit D, 25-Hydroxy: 23.1 ng/mL — ABNORMAL LOW (ref 30.0–100.0)

## 2015-07-27 LAB — SPECIMEN STATUS REPORT

## 2015-07-27 LAB — TSH: TSH: 69.78 u[IU]/mL — ABNORMAL HIGH (ref 0.450–4.500)

## 2015-07-29 ENCOUNTER — Other Ambulatory Visit: Payer: Self-pay

## 2015-08-10 ENCOUNTER — Telehealth: Payer: Self-pay | Admitting: Family Medicine

## 2015-08-10 NOTE — Telephone Encounter (Signed)
ERRENOUS °

## 2015-08-11 ENCOUNTER — Ambulatory Visit (INDEPENDENT_AMBULATORY_CARE_PROVIDER_SITE_OTHER): Payer: 59 | Admitting: Family Medicine

## 2015-08-11 ENCOUNTER — Encounter: Payer: Self-pay | Admitting: Family Medicine

## 2015-08-11 VITALS — BP 134/86 | HR 104 | Temp 98.5°F | Resp 18 | Wt 256.7 lb

## 2015-08-11 DIAGNOSIS — E038 Other specified hypothyroidism: Secondary | ICD-10-CM

## 2015-08-11 DIAGNOSIS — N644 Mastodynia: Secondary | ICD-10-CM

## 2015-08-11 DIAGNOSIS — Z1239 Encounter for other screening for malignant neoplasm of breast: Secondary | ICD-10-CM | POA: Diagnosis not present

## 2015-08-11 DIAGNOSIS — E785 Hyperlipidemia, unspecified: Secondary | ICD-10-CM | POA: Diagnosis not present

## 2015-08-11 NOTE — Progress Notes (Signed)
Name: Debra Soto   MRN: [id number]    DOB: 1960-07-16   Date:08/11/2015       Progress Note  Subjective  Chief Complaint  Chief Complaint  Patient presents with  . Breast Problem    patient reports of having left breast tenderness. she first noticed shortly noticed after her last visit. patient has not noticed any other sx.  . Orders    mammogram    HPI  Breast tenderness: she noticed pain on left breast and fullness on left outer aspect going on for over one month. She called [facility] to schedule a mammogram and was told she need a diagnostic study. No nipple pain or discharge, no trauma to the breast, or lumps that she can feel. Mother had breast cancer.   Hypothyroidism: she is back on medication daily as prescribed, last TSH was out of control, but she was out of medication. Losing weight since resumed medication, fatigue has also improved.   Hyperlipidemia: we will recheck once TSH is back to normal, LDL was high, triglycerides had improved, not on medication for cholesterol  Patient Active Problem List   Diagnosis Date Noted  . Endometriosis 12/31/2014  . Insomnia, persistent 09/29/2014  . IBS (irritable bowel syndrome) 09/29/2014  . 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 Dyslipidemia 09/29/2014  . Fibromyalgia syndrome 09/29/2014  . Gastro-esophageal reflux disease without esophagitis 09/29/2014  . Herpes labialis 09/29/2014  . Adult hypothyroidism 09/29/2014  . Family history of malignant neoplasm of breast 09/29/2014  . Extreme obesity ([place]) 09/29/2014  . Major depressive episode 09/29/2014  . Obstructive apnea 09/29/2014  . Abnormal presence of protein in urine 09/29/2014  . Allergic rhinitis 09/29/2014  . Vitamin D deficiency 09/29/2014  . Urge incontinence 09/29/2014  . Headache, tension-type 09/29/2014  . Menopausal symptom 09/29/2014    Past Surgical History  Procedure Laterality Date  . Cholecystectomy  [id number]  . Abdominal hysterectomy  [id number]  .  Tonsilectomy, adenoidectomy, bilateral myringotomy and tubes    . Lipoma excision    . Bladder tact    . Colonoscopy    . Esophagogastroduodenoscopy      Family History  Problem Relation Age of Onset  . Hypertension Mother   . Arthritis Mother   . Cancer Mother     ovarian,breast, lung, liver  . Diabetes Brother   . Hypertension Brother   . Other Father     lung problems    Social History   Social History  . Marital Status: Married    Spouse Name: N/A  . Number of Children: N/A  . Years of Education: N/A   Occupational History  . Not on file.   Social History Main Topics  . Smoking status: Never Smoker   . Smokeless tobacco: Never Used  . Alcohol Use: 0.0 oz/week    0 Standard drinks or equivalent per week     Comment: rarely  . Drug Use: No  . Sexual Activity: Yes   Other Topics Concern  . Not on file   Social History Narrative     Current outpatient prescriptions:  .  amphetamine-dextroamphetamine (ADDERALL XR) 15 MG 24 hr capsule, Take 1 capsule by mouth every morning., Disp: 30 capsule, Rfl: 0 .  amphetamine-dextroamphetamine (ADDERALL XR) 15 MG 24 hr capsule, Take 1 capsule by mouth every morning., Disp: 30 capsule, Rfl: 0 .  amphetamine-dextroamphetamine (ADDERALL XR) 15 MG 24 hr capsule, Take 1 capsule by mouth every morning., Disp: 30 capsule, Rfl: 0 .  baclofen (LIORESAL) 10 MG  tablet, Take 1 tablet (10 mg total) by mouth as needed., Disp: 30 each, Rfl: 2 .  buPROPion (WELLBUTRIN XL) 300 MG 24 hr tablet, Take 1 tablet (300 mg total) by mouth daily., Disp: 30 tablet, Rfl: 2 .  Ca Phosphate-Cholecalciferol (CALCIUM/VITAMIN D3 GUMMIES) 200-200 MG-UNIT CHEW, Chew 1 each by mouth daily., Disp: , Rfl:  .  citalopram (CELEXA) 40 MG tablet, Take 1 tablet (40 mg total) by mouth daily., Disp: 30 tablet, Rfl: 2 .  fluticasone (FLONASE) 50 MCG/ACT nasal spray, Place 2 sprays into both nostrils daily. (Patient not taking: Reported on 07/22/2015), Disp: 16 g, Rfl: 6 .   HYDROcodone-acetaminophen (NORCO) 10-325 MG tablet, Take 1 tablet by mouth every 6 (six) hours as needed., Disp: 30 tablet, Rfl: 0 .  ibuprofen (ADVIL,MOTRIN) 600 MG tablet, Take 1 tablet (600 mg total) by mouth 2 (two) times daily., Disp: 60 tablet, Rfl: 2 .  levothyroxine (SYNTHROID) 200 MCG tablet, Take 1 tablet (200 mcg total) by mouth daily., Disp: 30 tablet, Rfl: 1 .  omeprazole (PRILOSEC) 20 MG capsule, take 1 capsule by mouth twice a day BEFORE A MEAL, Disp: 60 capsule, Rfl: 2 .  valACYclovir (VALTREX) 1000 MG tablet, Take 1 tablet by mouth as needed., Disp: , Rfl:   No Known Allergies   ROS  Ten systems reviewed and is negative except as mentioned in HPI   Objective  Filed Vitals:   08/11/15 1038  BP: 134/86  Pulse: 104  Temp: 98.5 F (36.9 C)  TempSrc: Oral  Resp: 18  Weight: 256 lb 11.2 oz (116.438 kg)  SpO2: 96%    Body mass index is 39.04 kg/(m^2).  Physical Exam  Constitutional: Patient appears well-developed and well-nourished. Obese  No distress.  HEENT: head atraumatic, normocephalic, pupils equal and reactive to light,  neck supple, throat within normal limits Cardiovascular: Normal rate, regular rhythm and normal heart sounds.  No murmur heard. No BLE edema. Pulmonary/Chest: Effort normal and breath sounds normal. No respiratory distress. Breast: very tender left breast, but no lumps, normal axillary exam bilaterally, normal right breast exam Abdominal: Soft.  There is no tenderness. Psychiatric: Patient has a normal mood and affect. behavior is normal. Judgment and thought content normal.  Recent Results (from the past 2160 hour(s))  Hemoglobin A1c     Status: Abnormal   Collection Time: 07/22/15  9:43 AM  Result Value Ref Range   Hgb A1c MFr Bld 5.7 (H) 4.8 - 5.6 %    Comment:          Pre-diabetes: 5.7 - 6.4          Diabetes: >6.4          Glycemic control for adults with diabetes: <7.0    Est. average glucose Bld gHb Est-mCnc 117 mg/dL  Lipid  panel     Status: Abnormal   Collection Time: 07/22/15  9:43 AM  Result Value Ref Range   Cholesterol, Total 236 (H) 100 - 199 mg/dL   Triglycerides 124 0 - 149 mg/dL   HDL 51 >39 mg/dL   VLDL Cholesterol Cal 25 5 - 40 mg/dL   LDL Calculated 160 (H) 0 - 99 mg/dL   Chol/HDL Ratio 4.6 (H) 0.0 - 4.4 ratio units    Comment:                                   T. Chol/HDL Ratio  Men  Women                               1/2 Avg.Risk  3.4    3.3                                   Avg.Risk  5.0    4.4                                2X Avg.Risk  9.6    7.1                                3X Avg.Risk 23.4   11.0   CBC with Differential/Platelet     Status: Abnormal   Collection Time: 07/22/15  9:43 AM  Result Value Ref Range   WBC 7.3 3.4 - 10.8 x10E3/uL   RBC 4.54 3.77 - 5.28 x10E6/uL   Hemoglobin 11.3 11.1 - 15.9 g/dL   Hematocrit 36.1 34.0 - 46.6 %   MCV 80 79 - 97 fL   MCH 24.9 (L) 26.6 - 33.0 pg   MCHC 31.3 (L) 31.5 - 35.7 g/dL   RDW 16.2 (H) 12.3 - 15.4 %   Platelets 418 (H) 150 - 379 x10E3/uL   Neutrophils 63 %   Lymphs 30 %   Monocytes 6 %   Eos 0 %   Basos 1 %   Neutrophils Absolute 4.6 1.4 - 7.0 x10E3/uL   Lymphocytes Absolute 2.2 0.7 - 3.1 x10E3/uL   Monocytes Absolute 0.4 0.1 - 0.9 x10E3/uL   EOS (ABSOLUTE) 0.0 0.0 - 0.4 x10E3/uL   Basophils Absolute 0.1 0.0 - 0.2 x10E3/uL   Immature Granulocytes 0 %   Immature Grans (Abs) 0.0 0.0 - 0.1 x10E3/uL  Comprehensive metabolic panel     Status: Abnormal   Collection Time: 07/22/15  9:43 AM  Result Value Ref Range   Glucose 89 65 - 99 mg/dL   BUN 14 6 - 24 mg/dL   Creatinine, Ser 0.95 0.57 - 1.00 mg/dL   GFR calc non Af Amer 68 >59 mL/min/1.73   GFR calc Af Amer 78 >59 mL/min/1.73   BUN/Creatinine Ratio 15 9 - 23   Sodium 142 134 - 144 mmol/L   Potassium 4.5 3.5 - 5.2 mmol/L   Chloride 102 96 - 106 mmol/L   CO2 23 18 - 29 mmol/L   Calcium 9.2 8.7 - 10.2 mg/dL   Total Protein 7.0  6.0 - 8.5 g/dL   Albumin 4.0 3.5 - 5.5 g/dL   Globulin, Total 3.0 1.5 - 4.5 g/dL   Albumin/Globulin Ratio 1.3 1.2 - 2.2   Bilirubin Total <0.2 0.0 - 1.2 mg/dL   Alkaline Phosphatase 126 (H) 39 - 117 IU/L   AST 13 0 - 40 IU/L   ALT 12 0 - 32 IU/L  VITAMIN D 25 Hydroxy (Vit-D Deficiency, Fractures)     Status: Abnormal   Collection Time: 07/22/15  9:43 AM  Result Value Ref Range   Vit D, 25-Hydroxy 23.1 (L) 30.0 - 100.0 ng/mL    Comment: Vitamin D deficiency has been defined by the Institute of Medicine and an Endocrine Society practice guideline as a level of serum 25-OH vitamin D less than 20 ng/mL (  1,2). The Endocrine Society went on to further define vitamin D insufficiency as a level between 21 and 29 ng/mL (2). 1. IOM (Institute of Medicine). 2010. Dietary reference    intakes for calcium and D. [place]: The    [employer]. 2. Holick MF, Binkley [place], Bischoff-Ferrari HA, et al.    Evaluation, treatment, and prevention of vitamin D    deficiency: an Endocrine Society clinical practice    guideline. JCEM. 2011 Jul; 96(7):1911-30.   TSH     Status: Abnormal   Collection Time: 07/22/15  9:43 AM  Result Value Ref Range   TSH 69.780 (H) 0.450 - 4.500 uIU/mL  Specimen status report     Status: None   Collection Time: 07/22/15  9:43 AM  Result Value Ref Range   specimen status report Comment     Comment: Written Authorization Written Authorization Written Authorization Received. Authorization received from [employer] 07-27-2015 Logged by Lenice Llamas     PHQ2/9: Depression screen [facility] 2/9 08/11/2015 03/31/2015 12/31/2014  Decreased Interest 0 0 0  Down, Depressed, Hopeless 0 1 1  PHQ - 2 Score 0 1 1    Fall Risk: Fall Risk  08/11/2015 07/22/2015 03/31/2015 12/31/2014  Falls in the past year? No No No No    Functional Status Survey: Is the patient deaf or have difficulty hearing?: No Does the patient have difficulty seeing, even when wearing  glasses/contacts?: No Does the patient have difficulty concentrating, remembering, or making decisions?: No Does the patient have difficulty walking or climbing stairs?: No Does the patient have difficulty dressing or bathing?: No Does the patient have difficulty doing errands alone such as visiting a doctor's office or shopping?: No    Assessment & Plan  1. Breast pain, left  - MM Digital Screening; Future  2. Dyslipidemia  Discussed life style modification   3. Other specified hypothyroidism  Explained again importance of compliance with medication   4. Breast cancer screening  - MM Digital Screening; Future

## 2015-08-20 ENCOUNTER — Ambulatory Visit
Admission: RE | Admit: 2015-08-20 | Discharge: 2015-08-20 | Disposition: A | Discharge status: Home / Self Care | Payer: 59 | Source: Ambulatory Visit | Attending: Family Medicine | Admitting: Family Medicine

## 2015-08-20 ENCOUNTER — Other Ambulatory Visit: Payer: Self-pay | Admitting: Family Medicine

## 2015-08-20 DIAGNOSIS — N644 Mastodynia: Secondary | ICD-10-CM

## 2015-08-20 DIAGNOSIS — Z1231 Encounter for screening mammogram for malignant neoplasm of breast: Secondary | ICD-10-CM | POA: Diagnosis present

## 2015-08-20 DIAGNOSIS — Z1239 Encounter for other screening for malignant neoplasm of breast: Secondary | ICD-10-CM

## 2015-08-20 DIAGNOSIS — R928 Other abnormal and inconclusive findings on diagnostic imaging of breast: Secondary | ICD-10-CM

## 2015-08-20 DIAGNOSIS — N63 Unspecified lump in breast: Secondary | ICD-10-CM | POA: Diagnosis not present

## 2015-08-24 ENCOUNTER — Other Ambulatory Visit: Payer: Self-pay | Admitting: Family Medicine

## 2015-08-24 DIAGNOSIS — N6310 Unspecified lump in the right breast, unspecified quadrant: Secondary | ICD-10-CM

## 2015-09-02 ENCOUNTER — Ambulatory Visit
Admission: RE | Admit: 2015-09-02 | Discharge: 2015-09-02 | Disposition: A | Discharge status: Home / Self Care | Payer: 59 | Source: Ambulatory Visit | Attending: Family Medicine | Admitting: Family Medicine

## 2015-09-02 DIAGNOSIS — N63 Unspecified lump in breast: Secondary | ICD-10-CM | POA: Diagnosis present

## 2015-09-02 DIAGNOSIS — N6310 Unspecified lump in the right breast, unspecified quadrant: Secondary | ICD-10-CM

## 2015-09-02 DIAGNOSIS — D241 Benign neoplasm of right breast: Secondary | ICD-10-CM | POA: Insufficient documentation

## 2015-09-02 HISTORY — PX: BREAST BIOPSY: SHX20

## 2015-09-03 LAB — SURGICAL PATHOLOGY

## 2015-09-08 ENCOUNTER — Telehealth: Payer: Self-pay | Admitting: Family Medicine

## 2015-09-08 DIAGNOSIS — M79604 Pain in right leg: Secondary | ICD-10-CM

## 2015-09-08 DIAGNOSIS — M25571 Pain in right ankle and joints of right foot: Secondary | ICD-10-CM

## 2015-09-08 NOTE — Telephone Encounter (Signed)
Pt said that she started having a stinging above her ankle on the side of her leg and now as the day goes by it has got more severe and now it is a shooting pain that really hurts at all the time even setting.Since the pain has gotten worse as the day has went by she is having a limp and can not place any weight on that leg.

## 2015-09-09 NOTE — Addendum Note (Signed)
Addended by: Johnnette Litter A on: 09/09/2015 09:32 AM   Modules accepted: Orders

## 2015-09-09 NOTE — Telephone Encounter (Signed)
Patient stated that her pain is worse today and would like to go to ortho.  Referral was placed.  Patient was scheduled to see Carlynn Spry, PA-C with [facility] for Friday, September 10, 2015 at [facility]  Patient was informed. Demographics and insurance card was faxed to their office at [phone].

## 2015-09-09 NOTE — Telephone Encounter (Signed)
Does she want to come in? I can also refer her to Ortho

## 2015-10-06 ENCOUNTER — Other Ambulatory Visit: Payer: Self-pay | Admitting: Family Medicine

## 2015-10-06 DIAGNOSIS — E038 Other specified hypothyroidism: Secondary | ICD-10-CM

## 2015-10-07 NOTE — Telephone Encounter (Signed)
Patient requesting refill. 

## 2015-10-18 ENCOUNTER — Other Ambulatory Visit: Payer: Self-pay | Admitting: Family Medicine

## 2015-10-22 ENCOUNTER — Ambulatory Visit (INDEPENDENT_AMBULATORY_CARE_PROVIDER_SITE_OTHER): Payer: 59 | Admitting: Family Medicine

## 2015-10-22 ENCOUNTER — Encounter: Payer: Self-pay | Admitting: Family Medicine

## 2015-10-22 VITALS — BP 128/78 | HR 102 | Temp 98.6°F | Resp 18 | Ht 68.0 in | Wt 254.6 lb

## 2015-10-22 DIAGNOSIS — K219 Gastro-esophageal reflux disease without esophagitis: Secondary | ICD-10-CM

## 2015-10-22 DIAGNOSIS — Z79899 Other long term (current) drug therapy: Secondary | ICD-10-CM | POA: Diagnosis not present

## 2015-10-22 DIAGNOSIS — G4700 Insomnia, unspecified: Secondary | ICD-10-CM

## 2015-10-22 DIAGNOSIS — D473 Essential (hemorrhagic) thrombocythemia: Secondary | ICD-10-CM

## 2015-10-22 DIAGNOSIS — D75839 Thrombocytosis, unspecified: Secondary | ICD-10-CM

## 2015-10-22 DIAGNOSIS — D241 Benign neoplasm of right breast: Secondary | ICD-10-CM | POA: Diagnosis not present

## 2015-10-22 DIAGNOSIS — E785 Hyperlipidemia, unspecified: Secondary | ICD-10-CM

## 2015-10-22 DIAGNOSIS — F329 Major depressive disorder, single episode, unspecified: Secondary | ICD-10-CM

## 2015-10-22 DIAGNOSIS — E038 Other specified hypothyroidism: Secondary | ICD-10-CM | POA: Diagnosis not present

## 2015-10-22 DIAGNOSIS — M797 Fibromyalgia: Secondary | ICD-10-CM | POA: Diagnosis not present

## 2015-10-22 DIAGNOSIS — E559 Vitamin D deficiency, unspecified: Secondary | ICD-10-CM | POA: Diagnosis not present

## 2015-10-22 LAB — TSH: TSH: 4.43 m[IU]/L

## 2015-10-22 MED ORDER — RANITIDINE HCL 150 MG PO CAPS: 150.0000 mg | ORAL_CAPSULE | Freq: Two times a day (BID) | ORAL | 2 refills | Status: DC

## 2015-10-22 MED ORDER — IBUPROFEN 600 MG PO TABS: 600.0000 mg | ORAL_TABLET | Freq: Two times a day (BID) | ORAL | 2 refills | Status: DC

## 2015-10-22 MED ORDER — CITALOPRAM HYDROBROMIDE 40 MG PO TABS: 40.0000 mg | ORAL_TABLET | Freq: Every day | ORAL | 2 refills | Status: DC

## 2015-10-22 MED ORDER — BUPROPION HCL ER (XL) 300 MG PO TB24: 300.0000 mg | ORAL_TABLET | Freq: Every day | ORAL | 2 refills | Status: DC

## 2015-10-22 MED ORDER — AMPHETAMINE-DEXTROAMPHET ER 15 MG PO CP24: 15.0000 mg | ORAL_CAPSULE | ORAL | 0 refills | Status: DC

## 2015-10-22 MED ORDER — OMEPRAZOLE 20 MG PO CPDR: DELAYED_RELEASE_CAPSULE | ORAL | 2 refills | Status: DC

## 2015-10-22 NOTE — Progress Notes (Signed)
Name: Debra Soto   MRN: [id number]    DOB: [birth year]   Date:10/22/2015       Progress Note  Subjective  Chief Complaint  Chief Complaint  Patient presents with  . Medication Refill    3 month F/U  . Hypothyroidism    Patient states that [facility] only had 3 of her Thyroid pills and has been out of her medication for 1 week. Patient has been very fatigue, hair loss, dry skin, constipated.   . Hyperlipidemia  . Depression  . Gastroesophageal Reflux    HPI  Hypothyroidism: she is back on medication daily as prescribed, last TSH was out of control, she states she was out of medication for about 1 week, but back on medication for the past couple of days. She has more energy, lost a few pounds.   Hyperlipidemia: we will recheck once TSH is back to normal, LDL was high, triglycerides had improved, not on medication for cholesterol   Major Depression: she is back on Citalopram, Wellbutrin but out of Adderal.  She states medication seems to be helping. She has more energy, able to focus, she still has some down periods, but does not want to change medication  Bowel Changes: she needs to re-schedule colonoscopy, she states that her bowel movements are normal constipated. Diarrhea has resolved. No abdominal pain  FMS: only taking Ibuprofen for FMS daily  she takes hydrocodone very seldom. Pain right now is 3/10 all over her body, mental fogginess, improves with Adderal, but she has been out, she loses prescriptions and would like to pick it up monthly   Dyslipidemia: I discussed ASCVD and is low at 2.1% no need for statin therapy.   GERD: she is taking Omeprazole twice daily and keeps symptoms under control. Discussed risk of Alzheimer's, c.difi colitis and she will try weaning self off. We will try switching gradually to Ranitidine   Patient Active Problem List   Diagnosis Date Noted  . Endometriosis 12/31/2014  . Insomnia, persistent 09/29/2014  . IBS (irritable bowel syndrome)  09/29/2014  . 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 Dyslipidemia 09/29/2014  . Fibromyalgia syndrome 09/29/2014  . Gastro-esophageal reflux disease without esophagitis 09/29/2014  . Herpes labialis 09/29/2014  . Adult hypothyroidism 09/29/2014  . Family history of malignant neoplasm of breast 09/29/2014  . Extreme obesity ([place]) 09/29/2014  . Major depressive episode 09/29/2014  . Obstructive apnea 09/29/2014  . Abnormal presence of protein in urine 09/29/2014  . Allergic rhinitis 09/29/2014  . Vitamin D deficiency 09/29/2014  . Urge incontinence 09/29/2014  . Headache, tension-type 09/29/2014  . Menopausal symptom 09/29/2014    Past Surgical History:  Procedure Laterality Date  . ABDOMINAL HYSTERECTOMY  [id number]  . bladder tact    . CHOLECYSTECTOMY  [id number]  . COLONOSCOPY    . ESOPHAGOGASTRODUODENOSCOPY    . LIPOMA EXCISION    . TONSILECTOMY, ADENOIDECTOMY, BILATERAL MYRINGOTOMY AND TUBES      Family History  Problem Relation Age of Onset  . Hypertension Mother   . Arthritis Mother   . Cancer Mother     ovarian,breast, lung, liver  . Breast cancer Mother 58  . Ovarian cancer Mother 29  . Lung cancer Mother 27  . Diabetes Brother   . Hypertension Brother   . Other Father     lung problems  . Ovarian cancer Maternal Grandmother     pt thinks is was ovarian but not sure    Social History   Social History  . Marital status:  Married    Spouse name: N/A  . Number of children: N/A  . Years of education: N/A   Occupational History  . Not on file.   Social History Main Topics  . Smoking status: Never Smoker  . Smokeless tobacco: Never Used  . Alcohol use 0.0 oz/week     Comment: rarely  . Drug use: No  . Sexual activity: Yes   Other Topics Concern  . Not on file   Social History Narrative  . No narrative on file     Current Outpatient Prescriptions:  .  amphetamine-dextroamphetamine (ADDERALL XR) 15 MG 24 hr capsule, Take 1 capsule by mouth every  morning., Disp: 30 capsule, Rfl: 0 .  baclofen (LIORESAL) 10 MG tablet, Take 1 tablet (10 mg total) by mouth as needed., Disp: 30 each, Rfl: 2 .  buPROPion (WELLBUTRIN XL) 300 MG 24 hr tablet, Take 1 tablet (300 mg total) by mouth daily., Disp: 30 tablet, Rfl: 2 .  Ca Phosphate-Cholecalciferol (CALCIUM/VITAMIN D3 GUMMIES) 200-200 MG-UNIT CHEW, Chew 1 each by mouth daily., Disp: , Rfl:  .  citalopram (CELEXA) 40 MG tablet, Take 1 tablet (40 mg total) by mouth daily., Disp: 30 tablet, Rfl: 2 .  fluticasone (FLONASE) 50 MCG/ACT nasal spray, Place 2 sprays into both nostrils daily., Disp: 16 g, Rfl: 6 .  HYDROcodone-acetaminophen (NORCO) 10-325 MG tablet, Take 1 tablet by mouth every 6 (six) hours as needed., Disp: 30 tablet, Rfl: 0 .  ibuprofen (ADVIL,MOTRIN) 600 MG tablet, Take 1 tablet (600 mg total) by mouth 2 (two) times daily., Disp: 60 tablet, Rfl: 2 .  levothyroxine (SYNTHROID, LEVOTHROID) 200 MCG tablet, take 1 tablet by mouth once daily, Disp: 30 tablet, Rfl: 0 .  omeprazole (PRILOSEC) 20 MG capsule, take 1 capsule by mouth twice a day BEFORE A MEAL, Disp: 60 capsule, Rfl: 2 .  valACYclovir (VALTREX) 1000 MG tablet, Take 1 tablet by mouth as needed., Disp: , Rfl:  .  ranitidine (ZANTAC) 150 MG capsule, Take 1 capsule (150 mg total) by mouth 2 (two) times daily. Try to wean off Prilosec, take in place of it, Disp: 60 capsule, Rfl: 2  No Known Allergies   ROS  Constitutional: Negative for fever or weight change.  Respiratory: Negative for cough and shortness of breath.   Cardiovascular: Negative for chest pain or palpitations.  Gastrointestinal: Negative for abdominal pain, no bowel changes.  Musculoskeletal: Negative for gait problem or joint swelling.  Skin: Negative for rash.  Neurological: Negative for dizziness or headache.  No other specific complaints in a complete review of systems (except as listed in HPI above).  Objective  Vitals:   10/22/15 0937  BP: 128/78  Pulse: (!)  102  Resp: 18  Temp: 98.6 F (37 C)  TempSrc: Oral  SpO2: 97%  Weight: 254 lb 9.6 oz (115.5 kg)  Height: 5\' 8"  (1.727 m)    Body mass index is 38.71 kg/m.  Physical Exam  Constitutional: Patient appears well-developed and well-nourished. Obese No distress.  HEENT: head atraumatic, normocephalic, pupils equal and reactive to light,  neck supple, throat within normal limits. No thyromegaly Cardiovascular: Normal rate, regular rhythm and normal heart sounds.  No murmur heard. No BLE edema. Pulmonary/Chest: Effort normal and breath sounds normal. No respiratory distress. Abdominal: Soft.  There is no tenderness. Psychiatric: Patient has a normal mood and affect. behavior is normal. Judgment and thought content normal.   Recent Results (from the past 2160 hour(s))  Surgical pathology  Status: None   Collection Time: 09/02/15  3:00 PM  Result Value Ref Range   SURGICAL PATHOLOGY      Surgical Pathology CASE: [id number] PATIENT: John Conklin Surgical Pathology Report     SPECIMEN SUBMITTED: A. 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     DIAGNOSIS: A. BREAST, RIGHT 5:00, 4 CM FN; ULTRASOUND GUIDED BIOPSY: - FIBROADENOMA. - NEGATIVE FOR ATYPIA AND MALIGNANCY.   GROSS DESCRIPTION:  A. The specimen is received in a formalin-filled container labeled with the patient's name and right breast 5:00 4cmFN.  Core pieces: 3 Measurement: 1.1-1.5 cm in length and 0.2 cm in diameter Comments: pink to tan fibrofatty fragments, marked green  Entirely submitted in cassette(s): 1  Time/Date in fixative: placed in formalin at 1455 on 09/02/2015 cold ischemic time of less than 1 minute Total fixation time: 6 hours   Final Diagnosis performed by Quay Burow, MD.  Electronically signed 09/03/2015 1:58:33PM    The electronic signature  indicates that the named  Attending Pathologist has evaluated the specimen  Technical component performed at [facility], [address] [place], [place] [address] Lab: [id number] Dir: Darrick Penna. Evette Doffing, MD  Professional component performed at [facility] [place], [facility], [place], [place], [place] [address] Lab: [phone] Dir: Dellia Nims. Rubinas, MD       PHQ2/9: Depression screen [facility] 2/9 10/22/2015 08/11/2015 03/31/2015 12/31/2014  Decreased Interest 0 0 0 0  Down, Depressed, Hopeless 0 0 1 1  PHQ - 2 Score 0 0 1 1    Fall Risk: Fall Risk  10/22/2015 08/11/2015 07/22/2015 03/31/2015 12/31/2014  Falls in the past year? No No No No No    Functional Status Survey: Is the patient deaf or have difficulty hearing?: No Does the patient have difficulty seeing, even when wearing glasses/contacts?: No Does the patient have difficulty concentrating, remembering, or making decisions?: No Does the patient have difficulty walking or climbing stairs?: No Does the patient have difficulty dressing or bathing?: No Does the patient have difficulty doing errands alone such as visiting a doctor's office or shopping?: No    Assessment & Plan  1. Other specified hypothyroidism  - TSH  2. Major depressive episode  - buPROPion (WELLBUTRIN XL) 300 MG 24 hr tablet; Take 1 tablet (300 mg total) by mouth daily.  Dispense: 30 tablet; Refill: 2 - citalopram (CELEXA) 40 MG tablet; Take 1 tablet (40 mg total) by mouth daily.  Dispense: 30 tablet; Refill: 2 - amphetamine-dextroamphetamine (ADDERALL XR) 15 MG 24 hr capsule; Take 1 capsule by mouth every morning.  Dispense: 30 capsule; Refill: 0  3. Dyslipidemia  Continue life style modification   4. Insomnia, persistent  Did not responded to Trazodone, she is sleeping better, but still has nocturia but able to fall back asleep most of the time, unless if very early in am  5. Thrombocytosis ([place])  Back to normal on last labs  6. Fibromyalgia  syndrome  Discussed risk of NSAID's - ibuprofen (ADVIL,MOTRIN) 600 MG tablet; Take 1 tablet (600 mg total) by mouth 2 (two) times daily.  Dispense: 60 tablet; Refill: 2 - amphetamine-dextroamphetamine (ADDERALL XR) 15 MG 24 hr capsule; Take 1 capsule by mouth every morning.  Dispense: 30 capsule; Refill: 0  7. Morbid obesity, unspecified obesity type [facility])  Discussed with the patient the risk posed by an increased BMI. Discussed importance of portion control, calorie counting and at least 150 minutes  of physical activity weekly. Avoid sweet beverages and drink more water. Eat at least 6 servings of fruit and vegetables daily   8. Vitamin D deficiency  Continue otc vitamin D supplementation  9. Gastro-esophageal reflux disease without esophagitis  - omeprazole (PRILOSEC) 20 MG capsule; take 1 capsule by mouth twice a day BEFORE A MEAL  Dispense: 60 capsule; Refill: 2 - ranitidine (ZANTAC) 150 MG capsule; Take 1 capsule (150 mg total) by mouth 2 (two) times daily. Try to wean off Prilosec, take in place of it  Dispense: 60 capsule; Refill: 2

## 2015-11-29 ENCOUNTER — Other Ambulatory Visit: Payer: Self-pay | Admitting: Family Medicine

## 2015-12-14 ENCOUNTER — Emergency Department: Payer: 59

## 2015-12-14 DIAGNOSIS — K2900 Acute gastritis without bleeding: Secondary | ICD-10-CM | POA: Insufficient documentation

## 2015-12-14 DIAGNOSIS — E039 Hypothyroidism, unspecified: Secondary | ICD-10-CM | POA: Insufficient documentation

## 2015-12-14 DIAGNOSIS — R112 Nausea with vomiting, unspecified: Secondary | ICD-10-CM | POA: Diagnosis present

## 2015-12-14 DIAGNOSIS — Z7951 Long term (current) use of inhaled steroids: Secondary | ICD-10-CM | POA: Insufficient documentation

## 2015-12-14 DIAGNOSIS — R079 Chest pain, unspecified: Secondary | ICD-10-CM | POA: Insufficient documentation

## 2015-12-14 DIAGNOSIS — Z79899 Other long term (current) drug therapy: Secondary | ICD-10-CM | POA: Diagnosis not present

## 2015-12-14 DIAGNOSIS — Z791 Long term (current) use of non-steroidal anti-inflammatories (NSAID): Secondary | ICD-10-CM | POA: Diagnosis not present

## 2015-12-14 NOTE — ED Triage Notes (Signed)
Patient ambulatory to triage with steady gait, without difficulty or distress noted; pt reports mid CP radiating into back accomp by N/V x 2 days; st hx reflux and fibromyalgia

## 2015-12-15 ENCOUNTER — Emergency Department
Admission: EM | Admit: 2015-12-15 | Discharge: 2015-12-15 | Disposition: A | Discharge status: Home / Self Care | Payer: 59 | Attending: Emergency Medicine | Admitting: Emergency Medicine

## 2015-12-15 ENCOUNTER — Ambulatory Visit (INDEPENDENT_AMBULATORY_CARE_PROVIDER_SITE_OTHER): Payer: 59 | Admitting: Family Medicine

## 2015-12-15 ENCOUNTER — Encounter: Payer: Self-pay | Admitting: Family Medicine

## 2015-12-15 VITALS — BP 132/78 | HR 104 | Temp 98.0°F | Resp 16 | Ht 68.0 in | Wt 253.3 lb

## 2015-12-15 DIAGNOSIS — R1310 Dysphagia, unspecified: Secondary | ICD-10-CM | POA: Diagnosis not present

## 2015-12-15 DIAGNOSIS — F329 Major depressive disorder, single episode, unspecified: Secondary | ICD-10-CM | POA: Diagnosis not present

## 2015-12-15 DIAGNOSIS — D649 Anemia, unspecified: Secondary | ICD-10-CM

## 2015-12-15 DIAGNOSIS — K219 Gastro-esophageal reflux disease without esophagitis: Secondary | ICD-10-CM

## 2015-12-15 DIAGNOSIS — R809 Proteinuria, unspecified: Secondary | ICD-10-CM

## 2015-12-15 DIAGNOSIS — M797 Fibromyalgia: Secondary | ICD-10-CM

## 2015-12-15 DIAGNOSIS — K2900 Acute gastritis without bleeding: Secondary | ICD-10-CM

## 2015-12-15 DIAGNOSIS — R112 Nausea with vomiting, unspecified: Secondary | ICD-10-CM

## 2015-12-15 DIAGNOSIS — K449 Diaphragmatic hernia without obstruction or gangrene: Secondary | ICD-10-CM | POA: Diagnosis not present

## 2015-12-15 DIAGNOSIS — R1030 Lower abdominal pain, unspecified: Secondary | ICD-10-CM

## 2015-12-15 DIAGNOSIS — R1319 Other dysphagia: Secondary | ICD-10-CM

## 2015-12-15 DIAGNOSIS — Z23 Encounter for immunization: Secondary | ICD-10-CM

## 2015-12-15 DIAGNOSIS — R079 Chest pain, unspecified: Secondary | ICD-10-CM

## 2015-12-15 LAB — URINALYSIS COMPLETE WITH MICROSCOPIC (ARMC ONLY)
Bilirubin Urine: NEGATIVE
GLUCOSE, UA: NEGATIVE mg/dL
Hgb urine dipstick: NEGATIVE
KETONES UR: NEGATIVE mg/dL
Leukocytes, UA: NEGATIVE
NITRITE: NEGATIVE
Protein, ur: 30 mg/dL — AB
SPECIFIC GRAVITY, URINE: 1.025 (ref 1.005–1.030)
pH: 5 (ref 5.0–8.0)

## 2015-12-15 LAB — BASIC METABOLIC PANEL
Anion gap: 8 (ref 5–15)
BUN: 18 mg/dL (ref 6–20)
CO2: 27 mmol/L (ref 22–32)
CREATININE: 1.05 mg/dL — AB (ref 0.44–1.00)
Calcium: 9.3 mg/dL (ref 8.9–10.3)
Chloride: 102 mmol/L (ref 101–111)
GFR calc Af Amer: >60 mL/min (ref >60)
GFR, EST NON AFRICAN AMERICAN: 59 mL/min — AB (ref >60)
Glucose, Bld: 114 mg/dL — ABNORMAL HIGH (ref 65–99)
POTASSIUM: 3.5 mmol/L (ref 3.5–5.1)
SODIUM: 137 mmol/L (ref 135–145)

## 2015-12-15 LAB — CBC
HCT: 33.9 % — ABNORMAL LOW (ref 35.0–47.0)
Hemoglobin: 11.1 g/dL — ABNORMAL LOW (ref 12.0–16.0)
MCH: 24.9 pg — ABNORMAL LOW (ref 26.0–34.0)
MCHC: 32.8 g/dL (ref 32.0–36.0)
MCV: 75.8 fL — ABNORMAL LOW (ref 80.0–100.0)
PLATELETS: 370 10*3/uL (ref 150–440)
RBC: 4.48 MIL/uL (ref 3.80–5.20)
RDW: 16.6 % — AB (ref 11.5–14.5)
WBC: 12.3 10*3/uL — AB (ref 3.6–11.0)

## 2015-12-15 LAB — TROPONIN I: Troponin I: <0.03 ng/mL (ref <0.03)

## 2015-12-15 MED ORDER — SUCRALFATE 1 G PO TABS: 1.0000 g | ORAL_TABLET | Freq: Two times a day (BID) | ORAL | 0 refills | Status: DC

## 2015-12-15 MED ORDER — ONDANSETRON HCL 4 MG PO TABS: 4.0000 mg | ORAL_TABLET | Freq: Every day | ORAL | 1 refills | Status: DC | PRN

## 2015-12-15 MED ORDER — GI COCKTAIL ~~LOC~~
30.0000 mL | Freq: Once | ORAL | Status: AC
  Administered 2015-12-15: 30 mL via ORAL
  Filled 2015-12-15: qty 30

## 2015-12-15 MED ORDER — SUCRALFATE 1 G PO TABS
1.0000 g | ORAL_TABLET | Freq: Once | ORAL | Status: AC
  Administered 2015-12-15: 1 g via ORAL
  Filled 2015-12-15: qty 1

## 2015-12-15 MED ORDER — AMPHETAMINE-DEXTROAMPHET ER 15 MG PO CP24: 15.0000 mg | ORAL_CAPSULE | ORAL | 0 refills | Status: DC

## 2015-12-15 NOTE — ED Provider Notes (Signed)
[facility] Emergency Department Provider Note   ____________________________________________   First MD Initiated Contact with Patient 12/15/15 0157     (approximate)  I have reviewed the triage vital signs and the nursing notes.   HISTORY  Chief Complaint Emesis and Chest Pain    HPI Debra Soto is a 55 y.o. female who comes into the hospital today with an ongoing problem. She reports that she is having severe pain and decided to do something about it. The patient's having some chest pain with back pain and trouble swallowing. She reports that the pain was initially 8 out of 10 in intensity but is currently improved. She did have some nausea and vomiting. She reports that she either has vomiting when she eats or diarrhea. She reports that this is been going on for the past 45 days. Her doctor has taken her off of omeprazole and started her on ranitidine but the patient reports that it is not working for her. She reports that she has an appointment coming up with her doctor but she hasn't called them to let them know what was going on the last few days. Tonight the patient couldn't lay down and couldn't get comfortable due to the pain. She reports that she also vomited and had some chunks of red stuff in it. She reports it was after she had eaten. She does have some increased pain when she eats. The patient is also had some increased gas with flatulence and burping. The patient is here for evaluation.   Past Medical History:  Diagnosis Date  . Allergy   . Depression   . Fibromyalgia   . GERD (gastroesophageal reflux disease)   . Hyperlipemia   . Insomnia   . Obesity   . Recurrent HSV (herpes simplex virus)   . Sleep apnea    pt states does not use CPAP  . Thyroid disease   . Vitamin D deficiency     Patient Active Problem List   Diagnosis Date Noted  . 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 Endometriosis 12/31/2014  . Insomnia, persistent  09/29/2014  . IBS (irritable bowel syndrome) 09/29/2014  . 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 Dyslipidemia 09/29/2014  . Fibromyalgia syndrome 09/29/2014  . Gastro-esophageal reflux disease without esophagitis 09/29/2014  . Herpes labialis 09/29/2014  . Adult hypothyroidism 09/29/2014  . Family history of malignant neoplasm of breast 09/29/2014  . Extreme obesity ([place]) 09/29/2014  . Major depressive episode 09/29/2014  . Obstructive apnea 09/29/2014  . Abnormal presence of protein in urine 09/29/2014  . Allergic rhinitis 09/29/2014  . Vitamin D deficiency 09/29/2014  . Urge incontinence 09/29/2014  . Headache, tension-type 09/29/2014  . Menopausal symptom 09/29/2014    Past Surgical History:  Procedure Laterality Date  . ABDOMINAL HYSTERECTOMY  [id number]  . bladder tact    . CHOLECYSTECTOMY  [id number]  . COLONOSCOPY    . ESOPHAGOGASTRODUODENOSCOPY    . LIPOMA EXCISION    . TONSILECTOMY, ADENOIDECTOMY, BILATERAL MYRINGOTOMY AND TUBES      Prior to Admission medications   Medication Sig Start Date End Date Taking? Authorizing Provider  amphetamine-dextroamphetamine (ADDERALL XR) 15 MG 24 hr capsule Take 1 capsule by mouth every morning. 10/22/15   Steele Sizer, MD  baclofen (LIORESAL) 10 MG tablet Take 1 tablet (10 mg total) by mouth as needed. 12/31/14   Steele Sizer, MD  buPROPion (WELLBUTRIN XL) 300 MG 24 hr tablet Take 1 tablet (300 mg total) by mouth daily. 10/22/15  Steele Sizer, MD  Ca Phosphate-Cholecalciferol (CALCIUM/VITAMIN D3 GUMMIES) 200-200 MG-UNIT CHEW Chew 1 each by mouth daily.    Historical Provider, MD  citalopram (CELEXA) 40 MG tablet Take 1 tablet (40 mg total) by mouth daily. 10/22/15   Steele Sizer, MD  fluticasone (FLONASE) 50 MCG/ACT nasal spray Place 2 sprays into both nostrils daily. 02/24/15   Steele Sizer, MD  HYDROcodone-acetaminophen (NORCO) 10-325 MG tablet Take 1 tablet by mouth every 6 (six) hours as needed. 07/22/15   Steele Sizer, MD   ibuprofen (ADVIL,MOTRIN) 600 MG tablet Take 1 tablet (600 mg total) by mouth 2 (two) times daily. 10/22/15   Steele Sizer, MD  levothyroxine (SYNTHROID, LEVOTHROID) 200 MCG tablet take 1 tablet by mouth once daily 11/30/15   Steele Sizer, MD  omeprazole (PRILOSEC) 20 MG capsule take 1 capsule by mouth twice a day BEFORE A MEAL 10/22/15   Steele Sizer, MD  ondansetron (ZOFRAN) 4 MG tablet Take 1 tablet (4 mg total) by mouth daily as needed for nausea or vomiting. 12/15/15 12/14/16  Loney Hering, MD  ranitidine (ZANTAC) 150 MG capsule Take 1 capsule (150 mg total) by mouth 2 (two) times daily. Try to wean off Prilosec, take in place of it 10/22/15   Steele Sizer, MD  sucralfate (CARAFATE) 1 g tablet Take 1 tablet (1 g total) by mouth 2 (two) times daily. 12/15/15   Loney Hering, MD  valACYclovir (VALTREX) 1000 MG tablet Take 1 tablet by mouth as needed. 04/08/14   Historical Provider, MD    Allergies Review of patient's allergies indicates no known allergies.  Family History  Problem Relation Age of Onset  . Hypertension Mother   . Arthritis Mother   . Cancer Mother     ovarian,breast, lung, liver  . Breast cancer Mother 64  . Ovarian cancer Mother 48  . Lung cancer Mother 51  . Diabetes Brother   . Hypertension Brother   . Other Father     lung problems  . Ovarian cancer Maternal Grandmother     pt thinks is was ovarian but not sure    Social History Social History  Substance Use Topics  . Smoking status: Never Smoker  . Smokeless tobacco: Never Used  . Alcohol use 0.0 oz/week     Comment: rarely    Review of Systems Constitutional: No fever/chills Eyes: No visual changes. ENT: No sore throat. Cardiovascular: chest pain. Respiratory: Denies shortness of breath. Gastrointestinal:  abdominal pain.   nausea, vomiting.  No diarrhea.  No constipation. Genitourinary: Negative for dysuria. Musculoskeletal:  back pain. Skin: Negative for rash. Neurological: Negative  for headaches, focal weakness or numbness.  10-point ROS otherwise negative.  ____________________________________________   PHYSICAL EXAM:  VITAL SIGNS: ED Triage Vitals  Enc Vitals Group     BP 12/14/15 2340 (!) 145/77     Pulse Rate 12/14/15 2340 100     Resp 12/15/15 0222 20     Temp 12/14/15 2340 98 F (36.7 C)     Temp Source 12/14/15 2340 Oral     SpO2 12/14/15 2340 97 %     Weight 12/14/15 2339 250 lb (113.4 kg)     Height 12/14/15 2339 5\' 8"  (1.727 m)     Head Circumference --      Peak Flow --      Pain Score 12/14/15 2339 8     Pain Loc --      Pain Edu? --      Excl. in  GC? --     Constitutional: Alert and oriented. Well appearing and in Mild distress. Eyes: Conjunctivae are normal. PERRL. EOMI. Head: Atraumatic. Nose: No congestion/rhinnorhea. Mouth/Throat: Mucous membranes are moist.  Oropharynx non-erythematous. Cardiovascular: Normal rate, regular rhythm. Grossly normal heart sounds.  Good peripheral circulation. Respiratory: Normal respiratory effort.  No retractions. Lungs CTAB. Gastrointestinal: Soft with some mild left upper quadrant tenderness to palpation. No distention. Positive bowel sounds Musculoskeletal: No lower extremity tenderness nor edema.   Neurologic:  Normal speech and language. Skin:  Skin is warm, dry and intact. Psychiatric: Mood and affect are normal.   ____________________________________________   LABS (all labs ordered are listed, but only abnormal results are displayed)  Labs Reviewed  BASIC METABOLIC PANEL - Abnormal; Notable for the following:       Result Value   Glucose, Bld 114 (*)    Creatinine, Ser 1.05 (*)    GFR calc non Af Amer 59 (*)    All other components within normal limits  CBC - Abnormal; Notable for the following:    WBC 12.3 (*)    Hemoglobin 11.1 (*)    HCT 33.9 (*)    MCV 75.8 (*)    MCH 24.9 (*)    RDW 16.6 (*)    All other components within normal limits  URINALYSIS COMPLETEWITH  MICROSCOPIC ([facility] ONLY) - Abnormal; Notable for the following:    Color, Urine YELLOW (*)    APPearance HAZY (*)    Protein, ur 30 (*)    Bacteria, UA RARE (*)    Squamous Epithelial / LPF 6-30 (*)    All other components within normal limits  TROPONIN I   ____________________________________________  EKG  ED ECG REPORT I, Loney Hering, the attending physician, personally viewed and interpreted this ECG.   Date: 12/14/2015  EKG Time: 2345  Rate: 93  Rhythm: normal sinus rhythm  Axis: normal  Intervals:none  ST&T Change: none  ____________________________________________  RADIOLOGY  Chest x-ray ____________________________________________   PROCEDURES  Procedure(s) performed: None  Procedures  Critical Care performed: No  ____________________________________________   INITIAL IMPRESSION / ASSESSMENT AND PLAN / ED COURSE  Pertinent labs & imaging results that were available during my care of the patient were reviewed by me and considered in my medical decision making (see chart for details).  This is a 32 old female who comes into the hospital today with some chest pain with difficulty swallowing and back pain. The patient is been having this for multiple days and she's been having even more symptoms for multiple weeks. The patient reports that she couldn't tolerate the pain today. She is feeling comfortable at this time. I will give the patient a GI cocktail as well as some Carafate and I will reassess the patient. The concerns that the patient may have some esophageal spasm or reflux or even an ulcer causing her persistent pain and symptoms that she has been having.  Clinical Course  Value Comment By Time  DG Chest 2 View No evidence of active pulmonary disease.  Esophageal hiatal hernia. Loney Hering, MD 10/04 [id number]    The patient's chest x-ray does show a hiatal hernia. Her blood work is unremarkable. The patient's pain is gone after the medication.  I did inform the patient that she doesn't follow-up with her primary care physician as well as received an endoscopy by GI physician the patient agrees and understands. I will send the patient home with some Carafate to help continue to  treat her symptoms. She reports that she will call to attempt to make an appointment with her doctor. Otherwise the patient will be discharged home. ____________________________________________   FINAL CLINICAL IMPRESSION(S) / ED DIAGNOSES  Final diagnoses:  Chest pain, unspecified type  Dysphagia, unspecified type  Acute gastritis without hemorrhage, unspecified gastritis type  Nausea and vomiting, intractability of vomiting not specified, unspecified vomiting type      NEW MEDICATIONS STARTED DURING THIS VISIT:  Discharge Medication List as of 12/15/2015  5:28 AM    START taking these medications   Details  ondansetron (ZOFRAN) 4 MG tablet Take 1 tablet (4 mg total) by mouth daily as needed for nausea or vomiting., Starting Wed 12/15/2015, Until Thu 12/14/2016, Print    sucralfate (CARAFATE) 1 g tablet Take 1 tablet (1 g total) by mouth 2 (two) times daily., Starting Wed 12/15/2015, Print         Note:  This document was prepared using Dragon voice recognition software and may include unintentional dictation errors.    Loney Hering, MD 12/15/15 [phone]

## 2015-12-15 NOTE — Progress Notes (Signed)
Name: Debra Soto   MRN: [id number]    DOB: [birth year]   Date:12/15/2015       Progress Note  Subjective  Chief Complaint  Chief Complaint  Patient presents with  . Hospitalization Follow-up    was in ED yesterday for dysphagia and nausea. Pt stated that she has also had alot of belching and flatulance recently    HPI  EC follow up: she went to [facility] last night because she has noticed worsening of epigastric pain, dysphagia on esophageal area, and also oral odynophagia. She has a long history of GERD and we tried switching from Omeprazole to Ranitidine, but it was not working and we sent Omeprazole 08/11 but she did not pick it up from her pharmacy. She states she has eructation, bloating and flatulence. No significant weight changes no blood in stools, over the past two days she has noticed constipation and a painful bowel movement yesterday . Labs showed mild anemia, lower in GFR and CXR showed hiatal hernia.   Patient Active Problem List   Diagnosis Date Noted  . 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 Endometriosis 12/31/2014  . Insomnia, persistent 09/29/2014  . IBS (irritable bowel syndrome) 09/29/2014  . 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 Dyslipidemia 09/29/2014  . Fibromyalgia syndrome 09/29/2014  . Gastro-esophageal reflux disease without esophagitis 09/29/2014  . Herpes labialis 09/29/2014  . Adult hypothyroidism 09/29/2014  . Family history of malignant neoplasm of breast 09/29/2014  . Extreme obesity ([place]) 09/29/2014  . Major depressive episode 09/29/2014  . Obstructive apnea 09/29/2014  . Abnormal presence of protein in urine 09/29/2014  . Allergic rhinitis 09/29/2014  . Vitamin D deficiency 09/29/2014  . Urge incontinence 09/29/2014  . Headache, tension-type 09/29/2014  . Menopausal symptom 09/29/2014    Past Surgical History:  Procedure Laterality Date  . ABDOMINAL HYSTERECTOMY  [id number]  . bladder tact    . CHOLECYSTECTOMY  [id number]  . COLONOSCOPY     . ESOPHAGOGASTRODUODENOSCOPY    . LIPOMA EXCISION    . TONSILECTOMY, ADENOIDECTOMY, BILATERAL MYRINGOTOMY AND TUBES      Family History  Problem Relation Age of Onset  . Hypertension Mother   . Arthritis Mother   . Cancer Mother     ovarian,breast, lung, liver  . Breast cancer Mother 32  . Ovarian cancer Mother 63  . Lung cancer Mother 32  . Diabetes Brother   . Hypertension Brother   . Other Father     lung problems  . Ovarian cancer Maternal Grandmother     pt thinks is was ovarian but not sure    Social History   Social History  . Marital status: Married    Spouse name: N/A  . Number of children: N/A  . Years of education: N/A   Occupational History  . Not on file.   Social History Main Topics  . Smoking status: Never Smoker  . Smokeless tobacco: Never Used  . Alcohol use 0.0 oz/week     Comment: rarely  . Drug use: No  . Sexual activity: Yes   Other Topics Concern  . Not on file   Social History Narrative  . No narrative on file     Current Outpatient Prescriptions:  .  amphetamine-dextroamphetamine (ADDERALL XR) 15 MG 24 hr capsule, Take 1 capsule by mouth every morning., Disp: 30 capsule, Rfl: 0 .  baclofen (LIORESAL) 10 MG tablet, Take 1 tablet (10 mg total) by mouth as needed., Disp: 30 each, Rfl: 2 .  buPROPion [facility]  XL) 300 MG 24 hr tablet, Take 1 tablet (300 mg total) by mouth daily., Disp: 30 tablet, Rfl: 2 .  Ca Phosphate-Cholecalciferol (CALCIUM/VITAMIN D3 GUMMIES) 200-200 MG-UNIT CHEW, Chew 1 each by mouth daily., Disp: , Rfl:  .  citalopram (CELEXA) 40 MG tablet, Take 1 tablet (40 mg total) by mouth daily., Disp: 30 tablet, Rfl: 2 .  fluticasone (FLONASE) 50 MCG/ACT nasal spray, Place 2 sprays into both nostrils daily., Disp: 16 g, Rfl: 6 .  HYDROcodone-acetaminophen (NORCO) 10-325 MG tablet, Take 1 tablet by mouth every 6 (six) hours as needed., Disp: 30 tablet, Rfl: 0 .  ibuprofen (ADVIL,MOTRIN) 600 MG tablet, Take 1 tablet (600 mg  total) by mouth 2 (two) times daily., Disp: 60 tablet, Rfl: 2 .  levothyroxine (SYNTHROID, LEVOTHROID) 200 MCG tablet, take 1 tablet by mouth once daily, Disp: 30 tablet, Rfl: 2 .  omeprazole (PRILOSEC) 20 MG capsule, take 1 capsule by mouth twice a day BEFORE A MEAL, Disp: 60 capsule, Rfl: 2 .  ondansetron (ZOFRAN) 4 MG tablet, Take 1 tablet (4 mg total) by mouth daily as needed for nausea or vomiting., Disp: 30 tablet, Rfl: 1 .  ranitidine (ZANTAC) 150 MG capsule, Take 1 capsule (150 mg total) by mouth 2 (two) times daily. Try to wean off Prilosec, take in place of it, Disp: 60 capsule, Rfl: 2 .  sucralfate (CARAFATE) 1 g tablet, Take 1 tablet (1 g total) by mouth 2 (two) times daily., Disp: 20 tablet, Rfl: 0 .  valACYclovir (VALTREX) 1000 MG tablet, Take 1 tablet by mouth as needed., Disp: , Rfl:   No Known Allergies   ROS  Ten systems reviewed and is negative except as mentioned in HPI   Objective  Vitals:   12/15/15 1132  BP: 132/78  Pulse: (!) 104  Resp: 16  Temp: 98 F (36.7 C)  SpO2: 98%  Weight: 253 lb 5 oz (114.9 kg)  Height: '5\' 8"'  (1.727 m)    Body mass index is 38.52 kg/m.  Physical Exam  Constitutional: Patient appears well-developed and well-nourished. Obese  No distress.  HEENT: head atraumatic, normocephalic, pupils equal and reactive to light, neck supple, throat within normal limits Cardiovascular: Normal rate, regular rhythm and normal heart sounds.  No murmur heard. No BLE edema. Pulmonary/Chest: Effort normal and breath sounds normal. No respiratory distress. Abdominal: Soft.  There is diffuse abdominal pain , worse on lower abdomen, negative CVA tenderness Psychiatric: Patient has a normal mood and affect. behavior is normal. Judgment and thought content normal.  Recent Results (from the past 2160 hour(s))  TSH     Status: None   Collection Time: 10/22/15 10:31 AM  Result Value Ref Range   TSH 4.43 mIU/L    Comment:   Reference Range   > or = 20  Years  0.40-4.50   Pregnancy Range First trimester  0.26-2.66 Second trimester 0.55-2.73 Third trimester  0.43-2.91     Basic metabolic panel     Status: Abnormal   Collection Time: 12/14/15 11:41 PM  Result Value Ref Range   Sodium 137 135 - 145 mmol/L   Potassium 3.5 3.5 - 5.1 mmol/L   Chloride 102 101 - 111 mmol/L   CO2 27 22 - 32 mmol/L   Glucose, Bld 114 (H) 65 - 99 mg/dL   BUN 18 6 - 20 mg/dL   Creatinine, Ser 1.05 (H) 0.44 - 1.00 mg/dL   Calcium 9.3 8.9 - 10.3 mg/dL   GFR calc non Af Wyvonnia Lora  59 (L) >60 mL/min   GFR calc Af Amer >60 >60 mL/min    Comment: (NOTE) The eGFR has been calculated using the CKD EPI equation. This calculation has not been validated in all clinical situations. eGFR's persistently <60 mL/min signify possible Chronic Kidney Disease.    Anion gap 8 5 - 15  CBC     Status: Abnormal   Collection Time: 12/14/15 11:41 PM  Result Value Ref Range   WBC 12.3 (H) 3.6 - 11.0 K/uL   RBC 4.48 3.80 - 5.20 MIL/uL   Hemoglobin 11.1 (L) 12.0 - 16.0 g/dL   HCT 33.9 (L) 35.0 - 47.0 %   MCV 75.8 (L) 80.0 - 100.0 fL   MCH 24.9 (L) 26.0 - 34.0 pg   MCHC 32.8 32.0 - 36.0 g/dL   RDW 16.6 (H) 11.5 - 14.5 %   Platelets 370 150 - 440 K/uL  Troponin I     Status: None   Collection Time: 12/14/15 11:41 PM  Result Value Ref Range   Troponin I <0.03 <0.03 ng/mL  Urinalysis complete, with microscopic ([facility] only)     Status: Abnormal   Collection Time: 12/15/15  2:00 AM  Result Value Ref Range   Color, Urine YELLOW (A) YELLOW   APPearance HAZY (A) CLEAR   Glucose, UA NEGATIVE NEGATIVE mg/dL   Bilirubin Urine NEGATIVE NEGATIVE   Ketones, ur NEGATIVE NEGATIVE mg/dL   Specific Gravity, Urine 1.025 1.005 - 1.030   Hgb urine dipstick NEGATIVE NEGATIVE   pH 5.0 5.0 - 8.0   Protein, ur 30 (A) NEGATIVE mg/dL   Nitrite NEGATIVE NEGATIVE   Leukocytes, UA NEGATIVE NEGATIVE   RBC / HPF 0-5 0 - 5 RBC/hpf   WBC, UA 0-5 0 - 5 WBC/hpf   Bacteria, UA RARE (A) NONE SEEN    Squamous Epithelial / LPF 6-30 (A) NONE SEEN   Mucous PRESENT       PHQ2/9: Depression screen [facility] 2/9 12/15/2015 10/22/2015 08/11/2015 03/31/2015 12/31/2014  Decreased Interest 0 0 0 0 0  Down, Depressed, Hopeless 0 0 0 1 1  PHQ - 2 Score 0 0 0 1 1     Fall Risk: Fall Risk  12/15/2015 10/22/2015 08/11/2015 07/22/2015 03/31/2015  Falls in the past year? No No No No No     Functional Status Survey: Is the patient deaf or have difficulty hearing?: No Does the patient have difficulty seeing, even when wearing glasses/contacts?: No Does the patient have difficulty concentrating, remembering, or making decisions?: No Does the patient have difficulty walking or climbing stairs?: No Does the patient have difficulty dressing or bathing?: No Does the patient have difficulty doing errands alone such as visiting a doctor's office or shopping?: No   Assessment & Plan  1. Esophageal dysphagia  - Ambulatory referral to Gastroenterology  2. Odynophagia  - Ambulatory referral to Gastroenterology  3. Gastro-esophageal reflux disease without esophagitis  - Ambulatory referral to Gastroenterology Ibuprofen was stopped, needs to take Tylenol prn   4. Esophageal hiatal hernia  - Ambulatory referral to Gastroenterology  5. Needs flu shot  - Flu Vaccine QUAD 36+ mos IM  7. Fibromyalgia syndrome  She needs to stop ibuprofen, pain around 4-5/10  8. Proteinuria, unspecified type  - Urine Culture  9. Mild anemia  Refer to GI  10. Lower abdominal pain  - Urine Culture

## 2015-12-15 NOTE — ED Notes (Signed)
MD at bedside. 

## 2015-12-19 LAB — URINE CULTURE

## 2015-12-20 ENCOUNTER — Ambulatory Visit (INDEPENDENT_AMBULATORY_CARE_PROVIDER_SITE_OTHER): Payer: 59 | Admitting: Family Medicine

## 2015-12-20 ENCOUNTER — Encounter: Payer: Self-pay | Admitting: Family Medicine

## 2015-12-20 VITALS — BP 134/78 | HR 105 | Temp 98.6°F | Resp 17 | Ht 68.0 in | Wt 254.0 lb

## 2015-12-20 DIAGNOSIS — L508 Other urticaria: Secondary | ICD-10-CM | POA: Diagnosis not present

## 2015-12-20 MED ORDER — PREDNISONE 10 MG (21) PO TBPK: 10.0000 mg | ORAL_TABLET | Freq: Every day | ORAL | 0 refills | Status: DC

## 2015-12-20 NOTE — Progress Notes (Signed)
Name: Debra Soto   MRN: [id number]    DOB: 08/24/1960   Date:12/20/2015       Progress Note  Subjective  Chief Complaint  Chief Complaint  Patient presents with  . Allergic Reaction  This patient is usually followed by Dr. Ancil Boozer, new to me  HPI  Pt. Presents for sympotms of possible allergic reaction. She was started on Sucralfate by the ER on Tuesday 10/3 after diagnosing her with hiatal hernia. At around the same time, she was started on Turmeric by PCP in place of NSAID therapy (ibuprofenw as discontinued due to GERD and hiatal hernia). She reports that the morning after the ER visit where she took her first dose of Sucralfate, started noticing a non-pruritic blanching rash along her waistline, bilateral thighs and groin and swelling in her legs. Rash is spreading to her upper back, generalized pruritus on her body except for hands. She has taken Benadryl with no relief from rash. She feels like this may be an allergic reaction to Sucralfate or Turmeric and presents today to evaluate her symptoms.    Past Medical History:  Diagnosis Date  . Allergy   . Depression   . Fibromyalgia   . GERD (gastroesophageal reflux disease)   . Hyperlipemia   . Insomnia   . Obesity   . Recurrent HSV (herpes simplex virus)   . Sleep apnea    pt states does not use CPAP  . Thyroid disease   . Vitamin D deficiency     Past Surgical History:  Procedure Laterality Date  . ABDOMINAL HYSTERECTOMY  [id number]  . bladder tact    . CHOLECYSTECTOMY  [id number]  . COLONOSCOPY    . ESOPHAGOGASTRODUODENOSCOPY    . LIPOMA EXCISION    . TONSILECTOMY, ADENOIDECTOMY, BILATERAL MYRINGOTOMY AND TUBES      Family History  Problem Relation Age of Onset  . Hypertension Mother   . Arthritis Mother   . Cancer Mother     ovarian,breast, lung, liver  . Breast cancer Mother 35  . Ovarian cancer Mother 90  . Lung cancer Mother 39  . Diabetes Brother   . Hypertension Brother   . Other Father     lung  problems  . Ovarian cancer Maternal Grandmother     pt thinks is was ovarian but not sure    Social History   Social History  . Marital status: Married    Spouse name: N/A  . Number of children: N/A  . Years of education: N/A   Occupational History  . Not on file.   Social History Main Topics  . Smoking status: Never Smoker  . Smokeless tobacco: Never Used  . Alcohol use 0.0 oz/week     Comment: rarely  . Drug use: No  . Sexual activity: Yes   Other Topics Concern  . Not on file   Social History Narrative  . No narrative on file     Current Outpatient Prescriptions:  .  amphetamine-dextroamphetamine (ADDERALL XR) 15 MG 24 hr capsule, Take 1 capsule by mouth every morning., Disp: 30 capsule, Rfl: 0 .  buPROPion (WELLBUTRIN XL) 300 MG 24 hr tablet, Take 1 tablet (300 mg total) by mouth daily., Disp: 30 tablet, Rfl: 2 .  Ca Phosphate-Cholecalciferol (CALCIUM/VITAMIN D3 GUMMIES) 200-200 MG-UNIT CHEW, Chew 1 each by mouth daily., Disp: , Rfl:  .  citalopram (CELEXA) 40 MG tablet, Take 1 tablet (40 mg total) by mouth daily., Disp: 30 tablet, Rfl: 2 .  HYDROcodone-acetaminophen (NORCO) 10-325  MG tablet, Take 1 tablet by mouth every 6 (six) hours as needed., Disp: 30 tablet, Rfl: 0 .  levothyroxine (SYNTHROID, LEVOTHROID) 200 MCG tablet, take 1 tablet by mouth once daily, Disp: 30 tablet, Rfl: 2 .  omeprazole (PRILOSEC) 20 MG capsule, take 1 capsule by mouth twice a day BEFORE A MEAL, Disp: 60 capsule, Rfl: 2 .  ondansetron (ZOFRAN) 4 MG tablet, Take 1 tablet (4 mg total) by mouth daily as needed for nausea or vomiting., Disp: 30 tablet, Rfl: 1 .  ranitidine (ZANTAC) 150 MG capsule, Take 1 capsule (150 mg total) by mouth 2 (two) times daily. Try to wean off Prilosec, take in place of it, Disp: 60 capsule, Rfl: 2 .  valACYclovir (VALTREX) 1000 MG tablet, Take 1 tablet by mouth as needed., Disp: , Rfl:  .  baclofen (LIORESAL) 10 MG tablet, Take 1 tablet (10 mg total) by mouth as  needed. (Patient not taking: Reported on 12/20/2015), Disp: 30 each, Rfl: 2 .  fluticasone (FLONASE) 50 MCG/ACT nasal spray, Place 2 sprays into both nostrils daily. (Patient not taking: Reported on 12/20/2015), Disp: 16 g, Rfl: 6 .  sucralfate (CARAFATE) 1 g tablet, Take 1 tablet (1 g total) by mouth 2 (two) times daily. (Patient not taking: Reported on 12/20/2015), Disp: 20 tablet, Rfl: 0  No Known Allergies   Review of Systems  Constitutional: Positive for malaise/fatigue. Negative for chills and fever.  Respiratory: Positive for shortness of breath. Negative for cough and sputum production.   Cardiovascular: Negative for chest pain.  Gastrointestinal: Positive for nausea. Negative for abdominal pain and vomiting.  Skin: Positive for rash. Negative for itching.    Objective  Vitals:   12/20/15 1359  BP: 134/78  Pulse: (!) 105  Resp: 17  Temp: 98.6 F (37 C)  TempSrc: Oral  SpO2: 97%  Weight: 254 lb (115.2 kg)  Height: 5\' 8"  (1.727 m)    Physical Exam  Constitutional: She is well-developed, well-nourished, and in no distress.  Cardiovascular: Normal rate, regular rhythm, S1 normal and S2 normal.   Pulmonary/Chest: Breath sounds normal. She has no wheezes.  Skin: Rash noted. Rash is not pustular. There is erythema.  Erythematous, macular, non pruritic rash on the waistline and along the left and right groin, blanches on pressure, no vesicles or drainage.  Psychiatric: Mood, memory, affect and judgment normal.  Nursing note and vitals reviewed.    Assessment & Plan  1. Acute urticaria Persistent and unresponsive to histamine antagonists. We'll add oral steroid therapy. Encouraged to continue to take Benadryl twice daily as needed. Reassess if no improvement within 2-3 days. - predniSONE (STERAPRED UNI-PAK 21 TAB) 10 MG (21) TBPK tablet; Take 1 tablet (10 mg total) by mouth daily. 60 50 40 30 20 10  THEN STOP  Dispense: 21 tablet; Refill: 0   Uthman Mroczkowski [facility] 12/20/2015 2:15 PM

## 2015-12-27 ENCOUNTER — Ambulatory Visit (INDEPENDENT_AMBULATORY_CARE_PROVIDER_SITE_OTHER): Payer: 59 | Admitting: Family Medicine

## 2015-12-27 ENCOUNTER — Encounter: Payer: Self-pay | Admitting: Family Medicine

## 2015-12-27 VITALS — BP 128/84 | HR 113 | Temp 98.1°F | Resp 18 | Ht 68.0 in | Wt 250.4 lb

## 2015-12-27 DIAGNOSIS — R21 Rash and other nonspecific skin eruption: Secondary | ICD-10-CM | POA: Diagnosis not present

## 2015-12-27 NOTE — Progress Notes (Signed)
Name: Debra Soto   MRN: [id number]    DOB: 11-28-1960   Date:12/27/2015       Progress Note  Subjective  Chief Complaint  Chief Complaint  Patient presents with  . Follow-up    1 week  . Rash    Patient states she seen Dr. Manuella Ghazi last visit and was told she had Hives. Then put her on Prednisone and now having excessive peeling. Patient had rashs around bilateral elbow and stomach and upper thigh. When it was spreading it was painful and hand sting. The prednisone helped it from spreading and now it has toned down with the color from being so red.     HPI  Rash: symptoms started suddenly 10 days ago, seen by Dr. Manuella Ghazi, and given prednisone, it was painful , red around her waist line, under breast, groin area, under abdominal fold, both elbows also developed dryness on both hands and feet. The redness and stinging improved, but it made dry and pill. She has FMS and not sure if it is joint or muscle pain. No family of psoriasis. She had gone to [facility] and was given Gi coktail for nausea and epigastric pain the night before    Patient Active Problem List   Diagnosis Date Noted  . 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 Endometriosis 12/31/2014  . Insomnia, persistent 09/29/2014  . IBS (irritable bowel syndrome) 09/29/2014  . 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 Dyslipidemia 09/29/2014  . Fibromyalgia syndrome 09/29/2014  . Gastro-esophageal reflux disease without esophagitis 09/29/2014  . Herpes labialis 09/29/2014  . Adult hypothyroidism 09/29/2014  . Family history of malignant neoplasm of breast 09/29/2014  . Extreme obesity ([place]) 09/29/2014  . Major depressive episode 09/29/2014  . Obstructive apnea 09/29/2014  . Abnormal presence of protein in urine 09/29/2014  . Allergic rhinitis 09/29/2014  . Vitamin D deficiency 09/29/2014  . Urge incontinence 09/29/2014  . Headache, tension-type 09/29/2014  . Menopausal symptom 09/29/2014    Past Surgical History:  Procedure  Laterality Date  . ABDOMINAL HYSTERECTOMY  [id number]  . bladder tact    . CHOLECYSTECTOMY  [id number]  . COLONOSCOPY    . ESOPHAGOGASTRODUODENOSCOPY    . LIPOMA EXCISION    . TONSILECTOMY, ADENOIDECTOMY, BILATERAL MYRINGOTOMY AND TUBES      Family History  Problem Relation Age of Onset  . Hypertension Mother   . Arthritis Mother   . Cancer Mother     ovarian,breast, lung, liver  . Breast cancer Mother 8  . Ovarian cancer Mother 14  . Lung cancer Mother 21  . Diabetes Brother   . Hypertension Brother   . Other Father     lung problems  . Ovarian cancer Maternal Grandmother     pt thinks is was ovarian but not sure    Social History   Social History  . Marital status: Married    Spouse name: N/A  . Number of children: N/A  . Years of education: N/A   Occupational History  . Not on file.   Social History Main Topics  . Smoking status: Never Smoker  . Smokeless tobacco: Never Used  . Alcohol use 0.0 oz/week     Comment: rarely  . Drug use: No  . Sexual activity: Yes   Other Topics Concern  . Not on file   Social History Narrative  . No narrative on file     Current Outpatient Prescriptions:  .  amphetamine-dextroamphetamine (ADDERALL XR) 15 MG 24 hr capsule, Take 1 capsule by  mouth every morning., Disp: 30 capsule, Rfl: 0 .  baclofen (LIORESAL) 10 MG tablet, Take 1 tablet (10 mg total) by mouth as needed., Disp: 30 each, Rfl: 2 .  buPROPion (WELLBUTRIN XL) 300 MG 24 hr tablet, Take 1 tablet (300 mg total) by mouth daily., Disp: 30 tablet, Rfl: 2 .  Ca Phosphate-Cholecalciferol (CALCIUM/VITAMIN D3 GUMMIES) 200-200 MG-UNIT CHEW, Chew 1 each by mouth daily., Disp: , Rfl:  .  citalopram (CELEXA) 40 MG tablet, Take 1 tablet (40 mg total) by mouth daily., Disp: 30 tablet, Rfl: 2 .  HYDROcodone-acetaminophen (NORCO) 10-325 MG tablet, Take 1 tablet by mouth every 6 (six) hours as needed., Disp: 30 tablet, Rfl: 0 .  levothyroxine (SYNTHROID, LEVOTHROID) 200 MCG tablet,  take 1 tablet by mouth once daily, Disp: 30 tablet, Rfl: 2 .  omeprazole (PRILOSEC) 20 MG capsule, take 1 capsule by mouth twice a day BEFORE A MEAL, Disp: 60 capsule, Rfl: 2 .  valACYclovir (VALTREX) 1000 MG tablet, Take 1 tablet by mouth as needed., Disp: , Rfl:  .  fluticasone (FLONASE) 50 MCG/ACT nasal spray, Place 2 sprays into both nostrils daily. (Patient not taking: Reported on 12/27/2015), Disp: 16 g, Rfl: 6 .  ondansetron (ZOFRAN) 4 MG tablet, Take 1 tablet (4 mg total) by mouth daily as needed for nausea or vomiting. (Patient not taking: Reported on 12/27/2015), Disp: 30 tablet, Rfl: 1 .  ranitidine (ZANTAC) 150 MG capsule, Take 1 capsule (150 mg total) by mouth 2 (two) times daily. Try to wean off Prilosec, take in place of it (Patient not taking: Reported on 12/27/2015), Disp: 60 capsule, Rfl: 2 .  sucralfate (CARAFATE) 1 g tablet, Take 1 tablet (1 g total) by mouth 2 (two) times daily. (Patient not taking: Reported on 12/27/2015), Disp: 20 tablet, Rfl: 0  No Known Allergies   ROS  Ten systems reviewed and is negative except as mentioned in HPI   Objective  Vitals:   12/27/15 0900  BP: 128/84  Pulse: (!) 113  Resp: 18  Temp: 98.1 F (36.7 C)  TempSrc: Oral  SpO2: 97%  Weight: 250 lb 6.4 oz (113.6 kg)  Height: '5\' 8"'  (1.727 m)    Body mass index is 38.07 kg/m.  Physical Exam  Constitutional: Patient appears well-developed and well-nourished. Obese  No distress.  HEENT: head atraumatic, normocephalic, pupils equal and reactive to light, neck supple, throat within normal limits Cardiovascular: Normal rate, regular rhythm and normal heart sounds.  No murmur heard. No BLE edema. Pulmonary/Chest: Effort normal and breath sounds normal. No respiratory distress. Abdominal: Soft.  There is no tenderness. Psychiatric: Patient has a normal mood and affect. behavior is normal. Judgment and thought content normal. Rash: pilling on both elbows, erythematous rash, dry and  pilling, worse on elbows, no tenderness to touch.  Recent Results (from the past 2160 hour(s))  TSH     Status: None   Collection Time: 10/22/15 10:31 AM  Result Value Ref Range   TSH 4.43 mIU/L    Comment:   Reference Range   > or = 20 Years  0.40-4.50   Pregnancy Range First trimester  0.26-2.66 Second trimester 0.55-2.73 Third trimester  0.43-2.91     Basic metabolic panel     Status: Abnormal   Collection Time: 12/14/15 11:41 PM  Result Value Ref Range   Sodium 137 135 - 145 mmol/L   Potassium 3.5 3.5 - 5.1 mmol/L   Chloride 102 101 - 111 mmol/L   CO2 27 22 -  32 mmol/L   Glucose, Bld 114 (H) 65 - 99 mg/dL   BUN 18 6 - 20 mg/dL   Creatinine, Ser 1.05 (H) 0.44 - 1.00 mg/dL   Calcium 9.3 8.9 - 10.3 mg/dL   GFR calc non Af Amer 59 (L) >60 mL/min   GFR calc Af Amer >60 >60 mL/min    Comment: (NOTE) The eGFR has been calculated using the CKD EPI equation. This calculation has not been validated in all clinical situations. eGFR's persistently <60 mL/min signify possible Chronic Kidney Disease.    Anion gap 8 5 - 15  CBC     Status: Abnormal   Collection Time: 12/14/15 11:41 PM  Result Value Ref Range   WBC 12.3 (H) 3.6 - 11.0 K/uL   RBC 4.48 3.80 - 5.20 MIL/uL   Hemoglobin 11.1 (L) 12.0 - 16.0 g/dL   HCT 33.9 (L) 35.0 - 47.0 %   MCV 75.8 (L) 80.0 - 100.0 fL   MCH 24.9 (L) 26.0 - 34.0 pg   MCHC 32.8 32.0 - 36.0 g/dL   RDW 16.6 (H) 11.5 - 14.5 %   Platelets 370 150 - 440 K/uL  Troponin I     Status: None   Collection Time: 12/14/15 11:41 PM  Result Value Ref Range   Troponin I <0.03 <0.03 ng/mL  Urine Culture     Status: None   Collection Time: 12/15/15 12:00 AM  Result Value Ref Range   Urine Culture, Routine Final report    Urine Culture result 1 Comment     Comment: Mixed urogenital flora 10,000-25,000 colony forming units per mL   Urinalysis complete, with microscopic ([facility] only)     Status: Abnormal   Collection Time: 12/15/15  2:00 AM  Result Value Ref  Range   Color, Urine YELLOW (A) YELLOW   APPearance HAZY (A) CLEAR   Glucose, UA NEGATIVE NEGATIVE mg/dL   Bilirubin Urine NEGATIVE NEGATIVE   Ketones, ur NEGATIVE NEGATIVE mg/dL   Specific Gravity, Urine 1.025 1.005 - 1.030   Hgb urine dipstick NEGATIVE NEGATIVE   pH 5.0 5.0 - 8.0   Protein, ur 30 (A) NEGATIVE mg/dL   Nitrite NEGATIVE NEGATIVE   Leukocytes, UA NEGATIVE NEGATIVE   RBC / HPF 0-5 0 - 5 RBC/hpf   WBC, UA 0-5 0 - 5 WBC/hpf   Bacteria, UA RARE (A) NONE SEEN   Squamous Epithelial / LPF 6-30 (A) NONE SEEN   Mucous PRESENT     PHQ2/9: Depression screen [facility] 2/9 12/20/2015 12/15/2015 10/22/2015 08/11/2015 03/31/2015  Decreased Interest 0 0 0 0 0  Down, Depressed, Hopeless 0 0 0 0 1  PHQ - 2 Score 0 0 0 0 1     Fall Risk: Fall Risk  12/20/2015 12/15/2015 10/22/2015 08/11/2015 07/22/2015  Falls in the past year? No No No No No    Assessment & Plan  1. Rash  It may inverted psoriasis, possible reaction to a viral illness that caused upper GI symptoms. - Sedimentation rate - C-reactive protein - Ambulatory referral to Dermatology

## 2015-12-27 NOTE — Patient Instructions (Signed)
Psoriasis Psoriasis is a long-term (chronic) condition of skin inflammation. It occurs because your immune system causes skin cells to form too quickly. As a result, too many skin cells grow and create raised, red patches (plaques) that look silvery on your skin. Plaques may appear anywhere on your body. They can be any size or shape. Psoriasis can come and go. The condition varies from mild to very severe. It cannot be passed from one person to another (not contagious).  CAUSES  The cause of psoriasis is not known, but certain factors can make the condition worse. These include:   Damage or trauma to the skin, such as cuts, scrapes, sunburn, and dryness.  Lack of sunlight.  Certain medicines.  Alcohol.  Tobacco use.  Stress.  Infections caused by bacteria or viruses. RISK FACTORS This condition is more likely to develop in:  People with a family history of psoriasis.  People who are Caucasian.  People who are between the ages of 15-30 and 50-60 years old. SYMPTOMS  There are five different types of psoriasis. You can have more than one type of psoriasis during your life. Types are:   Plaque.  Guttate.  Inverse.  Pustular.  Erythrodermic. Each type of psoriasis has different symptoms.   Plaque psoriasis symptoms include red, raised plaques with a silvery white coating (scale). These plaques may be itchy. Your nails may be pitted and crumbly or fall off.  Guttate psoriasis symptoms include small red spots that often show up on your trunk, arms, and legs. These spots may develop after you have been sick, especially with strep throat.  Inverse psoriasis symptoms include plaques in your underarm area, under your breasts, or on your genitals, groin, or buttocks.  Pustular psoriasis symptoms include pus-filled bumps that are painful, red, and swollen on the palms of your hands or the soles of your feet. You also may feel exhausted, feverish, weak, or have no  appetite.  Erythrodermic psoriasis symptoms include bright red skin that may look burned. You may have a fast heartbeat and a body temperature that is too high or too low. You may be itchy or in pain. DIAGNOSIS  Your health care provider may suspect psoriasis based on your symptoms and family history. Your health care provider will also do a physical exam. This may include a procedure to remove a tissue sample (biopsy) for testing. You may also be referred to a health care provider who specializes in skin diseases (dermatologist).  TREATMENT There is no cure for this condition, but treatment can help manage it. Goals of treatment include:   Helping your skin heal.  Reducing itching and inflammation.  Slowing the growth of new skin cells.  Helping your immune system respond better to your skin. Treatment varies, depending on the severity of your condition. Treatment may include:   Creams or ointments.  Ultraviolet ray exposure (light therapy). This may include natural sunlight or light therapy in a medical office.  Medicines (systemic therapy). These medicines can help your body better manage skin cell turnover and inflammation. They may be used along with light therapy or ointments. You may also get antibiotic medicines if you have an infection. HOME CARE INSTRUCTIONS Skin Care  Moisturize your skin as needed. Only use moisturizers that have been approved by your health care provider.   Apply cool compresses to the affected areas.   Do not scratch your skin.  Lifestyle  Do not use tobacco products. This includes cigarettes, chewing tobacco, and e-cigarettes. If you   need help quitting, ask your health care provider.  Drink little or no alcohol.   Try techniques for stress reduction, such as meditation or yoga.  Get exposure to the sun as told by your health care provider. Do not get sunburned.   Consider joining a psoriasis support group.  Medicines  Take or use  over-the-counter and prescription medicines only as told by your health care provider.  If you were prescribed an antibiotic, take or use it as told by your health care provider. Do not stop taking the antibiotic even if your condition starts to improve. General Instructions  Keep a journal to help track what triggers an outbreak. Try to avoid any triggers.   See a counselor or social worker if feelings of sadness, frustration, and hopelessness about your condition are interfering with your work and relationships.  Keep all follow-up visits as told by your health care provider. This is important. SEEK MEDICAL CARE IF:  Your pain gets worse.  You have increasing redness or warmth in the affected areas.   You have new or worsening pain or stiffness in your joints.  Your nails start to break easily or pull away from the nail bed.   You have a fever.   You feel depressed.   This information is not intended to replace advice given to you by your health care provider. Make sure you discuss any questions you have with your health care provider.   Document Released: 02/25/2000 Document Revised: 11/18/2014 Document Reviewed: 07/15/2014 Elsevier Interactive Patient Education 2016 [employer]  

## 2015-12-28 ENCOUNTER — Telehealth: Payer: Self-pay | Admitting: Family Medicine

## 2015-12-28 LAB — C-REACTIVE PROTEIN: CRP: 21 mg/L — AB (ref <8.0)

## 2015-12-28 LAB — SEDIMENTATION RATE: SED RATE: 28 mm/h (ref 0–30)

## 2015-12-28 NOTE — Telephone Encounter (Signed)
Patient was seen on yesterday and was told by Dr Ancil Boozer that she wanted her to see a dermatologist asap. They are not able to get her in until 01/28/16 therefore she would like to discuss this with you.

## 2015-12-29 NOTE — Telephone Encounter (Signed)
Spoke to pt and explained to her that because of limited dermatologists in this that the appointment she has should be ok but I would let [facility] know just in case

## 2015-12-29 NOTE — Telephone Encounter (Signed)
Please follow up with them, she needs to be seen sooner

## 2016-01-18 ENCOUNTER — Other Ambulatory Visit: Payer: Self-pay | Admitting: Family Medicine

## 2016-01-18 DIAGNOSIS — F329 Major depressive disorder, single episode, unspecified: Secondary | ICD-10-CM

## 2016-01-18 NOTE — Telephone Encounter (Signed)
Patient requesting refill of Wellbutrin to [facility].

## 2016-01-24 ENCOUNTER — Encounter: Payer: Self-pay | Admitting: Gastroenterology

## 2016-01-24 ENCOUNTER — Ambulatory Visit (INDEPENDENT_AMBULATORY_CARE_PROVIDER_SITE_OTHER): Payer: 59 | Admitting: Gastroenterology

## 2016-01-24 ENCOUNTER — Other Ambulatory Visit: Payer: Self-pay

## 2016-01-24 VITALS — BP 135/69 | HR 81 | Temp 98.2°F | Ht 68.0 in | Wt 259.0 lb

## 2016-01-24 DIAGNOSIS — K219 Gastro-esophageal reflux disease without esophagitis: Secondary | ICD-10-CM | POA: Diagnosis not present

## 2016-01-24 NOTE — Progress Notes (Signed)
Gastroenterology Consultation  Referring Provider:     Steele Sizer, MD Primary Care Physician:  Loistine Chance, MD Primary Gastroenterologist:  Dr. Allen Norris     Reason for Consultation:     GERD        HPI:   Debra Soto is a 55 y.o. y/o female referred for consultation & management of GERD by Dr. Loistine Chance, MD.  This patient comes in today with a report of heartburn. The patient was in the emergency room with severe abdominal pain and chest pain that she reports resulted in her getting a CT scan that showed a large hiatal hernia. The patient states that she has been doing well on omeprazole. She thinks most of her problems were from taking anti-inflammatory medications for her fibromyalgia. The patient also reports that she hasn't had a colonoscopy in the last 13 years. There is no report of any dysphagia, fevers, chills, black stools or bloody stools. The patient also denies any unexplained weight loss. The patient reports that she works for [employer] group and sometimes feel so bad that she can't go to work and that's why she takes her anti-inflammatory medication.  Past Medical History:  Diagnosis Date  . Allergy   . Depression   . Fibromyalgia   . GERD (gastroesophageal reflux disease)   . Hyperlipemia   . Insomnia   . Obesity   . Recurrent HSV (herpes simplex virus)   . Sleep apnea    pt states does not use CPAP  . Thyroid disease   . Vitamin D deficiency     Past Surgical History:  Procedure Laterality Date  . ABDOMINAL HYSTERECTOMY  [id number]  . bladder tact    . CHOLECYSTECTOMY  [id number]  . COLONOSCOPY    . ESOPHAGOGASTRODUODENOSCOPY    . LIPOMA EXCISION    . TONSILECTOMY, ADENOIDECTOMY, BILATERAL MYRINGOTOMY AND TUBES      Prior to Admission medications   Medication Sig Start Date End Date Taking? Authorizing Provider  amphetamine-dextroamphetamine (ADDERALL XR) 15 MG 24 hr capsule Take 1 capsule by mouth every morning. 12/15/15  Yes  Steele Sizer, MD  betamethasone dipropionate (DIPROLENE) 0.05 % cream apply A THIN LAYER to affected area twice a day UNTIL CLEAR 01/11/16  Yes Historical Provider, MD  buPROPion (WELLBUTRIN XL) 300 MG 24 hr tablet Take 1 tablet (300 mg total) by mouth daily. 10/22/15  Yes Steele Sizer, MD  Ca Phosphate-Cholecalciferol (CALCIUM/VITAMIN D3 GUMMIES) 200-200 MG-UNIT CHEW Chew 1 each by mouth daily.   Yes Historical Provider, MD  citalopram (CELEXA) 40 MG tablet Take 1 tablet (40 mg total) by mouth daily. 10/22/15  Yes Steele Sizer, MD  HYDROcodone-acetaminophen (NORCO) 10-325 MG tablet Take 1 tablet by mouth every 6 (six) hours as needed. 07/22/15  Yes Steele Sizer, MD  levothyroxine (SYNTHROID, LEVOTHROID) 200 MCG tablet take 1 tablet by mouth once daily 11/30/15  Yes Steele Sizer, MD  omeprazole (PRILOSEC) 20 MG capsule take 1 capsule by mouth twice a day BEFORE A MEAL 10/22/15  Yes Steele Sizer, MD  valACYclovir (VALTREX) 1000 MG tablet Take 1 tablet by mouth as needed. 04/08/14  Yes Historical Provider, MD  baclofen (LIORESAL) 10 MG tablet Take 1 tablet (10 mg total) by mouth as needed. Patient not taking: Reported on 01/24/2016 12/31/14   Steele Sizer, MD  fluticasone [facility]) 50 MCG/ACT nasal spray Place 2 sprays into both nostrils daily. Patient not taking: Reported on 01/24/2016 02/24/15   Steele Sizer, MD  ondansetron [facility])  4 MG tablet Take 1 tablet (4 mg total) by mouth daily as needed for nausea or vomiting. Patient not taking: Reported on 01/24/2016 12/15/15 12/14/16  Loney Hering, MD  ranitidine (ZANTAC) 150 MG capsule Take 1 capsule (150 mg total) by mouth 2 (two) times daily. Try to wean off Prilosec, take in place of it Patient not taking: Reported on 01/24/2016 10/22/15   Steele Sizer, MD  sucralfate (CARAFATE) 1 g tablet Take 1 tablet (1 g total) by mouth 2 (two) times daily. Patient not taking: Reported on 01/24/2016 12/15/15   Loney Hering, MD    Family  History  Problem Relation Age of Onset  . Hypertension Mother   . Arthritis Mother   . Cancer Mother     ovarian,breast, lung, liver  . Breast cancer Mother 32  . Ovarian cancer Mother 25  . Lung cancer Mother 36  . Diabetes Brother   . Hypertension Brother   . Other Father     lung problems  . Ovarian cancer Maternal Grandmother     pt thinks is was ovarian but not sure     Social History  Substance Use Topics  . Smoking status: Never Smoker  . Smokeless tobacco: Never Used  . Alcohol use 0.0 oz/week     Comment: rarely    Allergies as of 01/24/2016  . (No Known Allergies)    Review of Systems:    All systems reviewed and negative except where noted in HPI.   Physical Exam:  BP 135/69   Pulse 81   Temp 98.2 F (36.8 C) (Oral)   Ht 5\' 8"  (1.727 m)   Wt 259 lb (117.5 kg)   BMI 39.38 kg/m  No LMP recorded. Patient has had a hysterectomy. Psych:  Alert and cooperative. Normal mood and affect. General:   Alert,  Well-developed, well-nourished, pleasant and cooperative in NAD Head:  Normocephalic and atraumatic. Eyes:  Sclera clear, no icterus.   Conjunctiva pink. Ears:  Normal auditory acuity. Nose:  No deformity, discharge, or lesions. Mouth:  No deformity or lesions,oropharynx pink & moist. Neck:  Supple; no masses or thyromegaly. Lungs:  Respirations even and unlabored.  Clear throughout to auscultation.   No wheezes, crackles, or rhonchi. No acute distress. Heart:  Regular rate and rhythm; no murmurs, clicks, rubs, or gallops. Abdomen:  Normal bowel sounds.  No bruits.  Soft, non-tender and non-distended without masses, hepatosplenomegaly or hernias noted.  No guarding or rebound tenderness.  Negative Carnett sign.   Rectal:  Deferred.  Msk:  Symmetrical without gross deformities.  Good, equal movement & strength bilaterally. Pulses:  Normal pulses noted. Extremities:  No clubbing or edema.  No cyanosis. Neurologic:  Alert and oriented x3;  grossly normal  neurologically. Skin:  Intact without significant lesions or rashes.  No jaundice. Lymph Nodes:  No significant cervical adenopathy. Psych:  Alert and cooperative. Normal mood and affect.  Imaging Studies: No results found.  Assessment and Plan:   Debra Soto is a 55 y.o. y/o female who was in the emergency room with dysphagia and chest pain. The patient states that this has resolved and she is doing well on her omeprazole. The patient has not had an upper endoscopy to evaluate her hiatal hernia or possible Barrett's esophagus.The patient has been told to avoid her anti-inflammatory medications but she really needs it and then she should take it with food and not within 4 hours of lying down. The patient has also not had a  colonoscopy in the last 13 years. The patient will be set up for an EGD and colonoscopy. I have discussed risks & benefits which include, but are not limited to, bleeding, infection, perforation & drug reaction.  The patient agrees with this plan & written consent will be obtained.       Lucilla Lame, MD. Marval Regal   Note: This dictation was prepared with Dragon dictation along with smaller phrase technology. Any transcriptional errors that result from this process are unintentional.

## 2016-01-25 ENCOUNTER — Ambulatory Visit (INDEPENDENT_AMBULATORY_CARE_PROVIDER_SITE_OTHER): Payer: 59 | Admitting: Family Medicine

## 2016-01-25 ENCOUNTER — Encounter: Payer: Self-pay | Admitting: Family Medicine

## 2016-01-25 VITALS — BP 124/84 | HR 100 | Temp 98.1°F | Resp 16 | Ht 68.0 in | Wt 254.2 lb

## 2016-01-25 DIAGNOSIS — Z79899 Other long term (current) drug therapy: Secondary | ICD-10-CM | POA: Diagnosis not present

## 2016-01-25 DIAGNOSIS — L409 Psoriasis, unspecified: Secondary | ICD-10-CM | POA: Diagnosis not present

## 2016-01-25 DIAGNOSIS — F329 Major depressive disorder, single episode, unspecified: Secondary | ICD-10-CM | POA: Diagnosis not present

## 2016-01-25 DIAGNOSIS — K219 Gastro-esophageal reflux disease without esophagitis: Secondary | ICD-10-CM

## 2016-01-25 DIAGNOSIS — E038 Other specified hypothyroidism: Secondary | ICD-10-CM

## 2016-01-25 DIAGNOSIS — M797 Fibromyalgia: Secondary | ICD-10-CM

## 2016-01-25 LAB — TSH: TSH: 11.96 mIU/L — ABNORMAL HIGH

## 2016-01-25 MED ORDER — AMPHETAMINE-DEXTROAMPHET ER 15 MG PO CP24: 15.0000 mg | ORAL_CAPSULE | ORAL | 0 refills | Status: DC

## 2016-01-25 MED ORDER — HYDROCODONE-ACETAMINOPHEN 10-325 MG PO TABS: 1.0000 | ORAL_TABLET | Freq: Four times a day (QID) | ORAL | 0 refills | Status: DC | PRN

## 2016-01-25 MED ORDER — NORTRIPTYLINE HCL 10 MG PO CAPS: 10.0000 mg | ORAL_CAPSULE | Freq: Every day | ORAL | 2 refills | Status: DC

## 2016-01-25 MED ORDER — OMEPRAZOLE 20 MG PO CPDR: DELAYED_RELEASE_CAPSULE | ORAL | 2 refills | Status: DC

## 2016-01-25 MED ORDER — BUPROPION HCL ER (XL) 300 MG PO TB24: 300.0000 mg | ORAL_TABLET | Freq: Every day | ORAL | 2 refills | Status: DC

## 2016-01-25 MED ORDER — CITALOPRAM HYDROBROMIDE 40 MG PO TABS: 40.0000 mg | ORAL_TABLET | Freq: Every day | ORAL | 2 refills | Status: DC

## 2016-01-25 NOTE — Progress Notes (Signed)
Name: Debra Soto   MRN: [id number]    DOB: [birth year]   Date:01/25/2016       Progress Note  Subjective  Chief Complaint  Chief Complaint  Patient presents with  . Thyroid Problem    3 month follow up    HPI  Psoriasis: she had a rash that was very painful and all over her body, she was given prednisone by mouth in October without much help, seen by Dermatologist and is on steroid cream after a biopsy was positive. She still has dry and cracked skin, pain is worse at night.   Hypothyroidism:taking levothyroxine 200 mcg daily alternating with 100 mcg every other day, she has dry skin, no constipation, she has fatigue but could be from [place]  Major Depression: she is back on Citalopram, Wellbutrin and Adderal. She states she was feeling better but symptoms are worse now that is getting cold again.  She has lack of  energy and motivation again, no recent episodes of crying spells she still has interrupted sleep  She did not respond to Trazodone, discussed Nortriptyline to see if it will help with sleep and pain  Bowel Changes: she will have EGD and colonoscopy Nov 30 th, 2017  FMS: only taking Ibuprofen for FMS at this time, she takes hydrocodone very seldom, 30 pills lasts over 3 months Pain right now is 6/10 all over her body she also mental fogginess, affects her sleep.   Dyslipidemia: I discussed ASCVD and is low at 2.1% no need for statin therapy.   GERD: she is taking Omeprazole twice daily and keeps symptoms under control. Discussed risk of Alzheimer's, c.difi colitis and she tried to wean self off but symptoms got worse , so she is back on Omeprazole  Patient Active Problem List   Diagnosis Date Noted  . Psoriasis 01/25/2016  . 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 Endometriosis 12/31/2014  . Insomnia, persistent 09/29/2014  . IBS (irritable bowel syndrome) 09/29/2014  . 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 Dyslipidemia 09/29/2014  . Fibromyalgia syndrome  09/29/2014  . Gastro-esophageal reflux disease without esophagitis 09/29/2014  . Herpes labialis 09/29/2014  . Adult hypothyroidism 09/29/2014  . Family history of malignant neoplasm of breast 09/29/2014  . Extreme obesity ([place]) 09/29/2014  . Major depressive episode 09/29/2014  . Obstructive apnea 09/29/2014  . Abnormal presence of protein in urine 09/29/2014  . Allergic rhinitis 09/29/2014  . Vitamin D deficiency 09/29/2014  . Urge incontinence 09/29/2014  . Headache, tension-type 09/29/2014  . Menopausal symptom 09/29/2014    Past Surgical History:  Procedure Laterality Date  . ABDOMINAL HYSTERECTOMY  [id number]  . bladder tact    . CHOLECYSTECTOMY  [id number]  . COLONOSCOPY    . ESOPHAGOGASTRODUODENOSCOPY    . LIPOMA EXCISION    . TONSILECTOMY, ADENOIDECTOMY, BILATERAL MYRINGOTOMY AND TUBES      Family History  Problem Relation Age of Onset  . Hypertension Mother   . Arthritis Mother   . Cancer Mother     ovarian,breast, lung, liver  . Breast cancer Mother 37  . Ovarian cancer Mother 9  . Lung cancer Mother 57  . Diabetes Brother   . Hypertension Brother   . Other Father     lung problems  . Ovarian cancer Maternal Grandmother     pt thinks is was ovarian but not sure    Social History   Social History  . Marital status: Married    Spouse name: N/A  . Number of children: N/A  .  Years of education: N/A   Occupational History  . Not on file.   Social History Main Topics  . Smoking status: Never Smoker  . Smokeless tobacco: Never Used  . Alcohol use 0.0 oz/week     Comment: rarely  . Drug use: No  . Sexual activity: Yes   Other Topics Concern  . Not on file   Social History Narrative  . No narrative on file     Current Outpatient Prescriptions:  .  amphetamine-dextroamphetamine (ADDERALL XR) 15 MG 24 hr capsule, Take 1 capsule by mouth every morning., Disp: 30 capsule, Rfl: 0 .  amphetamine-dextroamphetamine (ADDERALL XR) 15 MG 24 hr capsule,  Take 1 capsule by mouth every morning., Disp: 30 capsule, Rfl: 0 .  amphetamine-dextroamphetamine (ADDERALL XR) 15 MG 24 hr capsule, Take 1 capsule by mouth every morning., Disp: 30 capsule, Rfl: 0 .  baclofen (LIORESAL) 10 MG tablet, Take 1 tablet (10 mg total) by mouth as needed. (Patient not taking: Reported on 01/24/2016), Disp: 30 each, Rfl: 2 .  betamethasone dipropionate (DIPROLENE) 0.05 % cream, apply A THIN LAYER to affected area twice a day UNTIL CLEAR, Disp: , Rfl: 0 .  buPROPion (WELLBUTRIN XL) 300 MG 24 hr tablet, Take 1 tablet (300 mg total) by mouth daily., Disp: 30 tablet, Rfl: 2 .  Ca Phosphate-Cholecalciferol (CALCIUM/VITAMIN D3 GUMMIES) 200-200 MG-UNIT CHEW, Chew 1 each by mouth daily., Disp: , Rfl:  .  citalopram (CELEXA) 40 MG tablet, Take 1 tablet (40 mg total) by mouth daily., Disp: 30 tablet, Rfl: 2 .  HYDROcodone-acetaminophen (NORCO) 10-325 MG tablet, Take 1 tablet by mouth every 6 (six) hours as needed., Disp: 30 tablet, Rfl: 0 .  levothyroxine (SYNTHROID, LEVOTHROID) 200 MCG tablet, take 1 tablet by mouth once daily, Disp: 30 tablet, Rfl: 2 .  omeprazole (PRILOSEC) 20 MG capsule, take 1 capsule by mouth twice a day BEFORE A MEAL, Disp: 60 capsule, Rfl: 2 .  valACYclovir (VALTREX) 1000 MG tablet, Take 1 tablet by mouth as needed., Disp: , Rfl:   No Known Allergies   ROS  Constitutional: Negative for fever or weight change.  Respiratory: Negative for cough and shortness of breath.   Cardiovascular: Negative for chest pain or palpitations.  Gastrointestinal: Negative for abdominal pain, no bowel changes.  Musculoskeletal: Negative for gait problem or joint swelling.  Skin: positive for rash.  Neurological: Negative for dizziness or headache.  No other specific complaints in a complete review of systems (except as listed in HPI above).  Objective  Vitals:   01/25/16 0846  BP: 124/84  Pulse: 100  Resp: 16  Temp: 98.1 F (36.7 C)  TempSrc: Oral  SpO2: 98%   Weight: 254 lb 3 oz (115.3 kg)  Height: _0  (1.727 m)    Body mass index is 38.65 kg/m.  Physical Exam  Constitutional: Patient appears well-developed and well-nourished. Obese o distress.  HEENT: head atraumatic, normocephalic, pupils equal and reactive to light, e neck supple, throat within normal limits Cardiovascular: Normal rate, regular rhythm and normal heart sounds.  No murmur heard. No BLE edema. Pulmonary/Chest: Effort normal and breath sounds normal. No respiratory distress. Abdominal: Soft.  There is no tenderness. Psychiatric: Patient has a normal mood and affect. behavior is normal. Judgment and thought content normal. Muscular skeletal: trigger points positive Skin: rash on hands and feet, very dry, cracked skin, no erythema  Recent Results (from the past 2160 hour(s))  Basic metabolic panel     Status: Abnormal  Collection Time: 12/14/15 11:41 PM  Result Value Ref Range   Sodium 137 135 - 145 mmol/L   Potassium 3.5 3.5 - 5.1 mmol/L   Chloride 102 101 - 111 mmol/L   CO2 27 22 - 32 mmol/L   Glucose, Bld 114 (H) 65 - 99 mg/dL   BUN 18 6 - 20 mg/dL   Creatinine, Ser 1.05 (H) 0.44 - 1.00 mg/dL   Calcium 9.3 8.9 - 10.3 mg/dL   GFR calc non Af Amer 59 (L) >60 mL/min   GFR calc Af Amer >60 >60 mL/min    Comment: (NOTE) The eGFR has been calculated using the CKD EPI equation. This calculation has not been validated in all clinical situations. eGFR's persistently <60 mL/min signify possible Chronic Kidney Disease.    Anion gap 8 5 - 15  CBC     Status: Abnormal   Collection Time: 12/14/15 11:41 PM  Result Value Ref Range   WBC 12.3 (H) 3.6 - 11.0 K/uL   RBC 4.48 3.80 - 5.20 MIL/uL   Hemoglobin 11.1 (L) 12.0 - 16.0 g/dL   HCT 33.9 (L) 35.0 - 47.0 %   MCV 75.8 (L) 80.0 - 100.0 fL   MCH 24.9 (L) 26.0 - 34.0 pg   MCHC 32.8 32.0 - 36.0 g/dL   RDW 16.6 (H) 11.5 - 14.5 %   Platelets 370 150 - 440 K/uL  Troponin I     Status: None   Collection Time: 12/14/15  11:41 PM  Result Value Ref Range   Troponin I <0.03 <0.03 ng/mL  Urine Culture     Status: None   Collection Time: 12/15/15 12:00 AM  Result Value Ref Range   Urine Culture, Routine Final report    Urine Culture result 1 Comment     Comment: Mixed urogenital flora 10,000-25,000 colony forming units per mL   Urinalysis complete, with microscopic ([facility] only)     Status: Abnormal   Collection Time: 12/15/15  2:00 AM  Result Value Ref Range   Color, Urine YELLOW (A) YELLOW   APPearance HAZY (A) CLEAR   Glucose, UA NEGATIVE NEGATIVE mg/dL   Bilirubin Urine NEGATIVE NEGATIVE   Ketones, ur NEGATIVE NEGATIVE mg/dL   Specific Gravity, Urine 1.025 1.005 - 1.030   Hgb urine dipstick NEGATIVE NEGATIVE   pH 5.0 5.0 - 8.0   Protein, ur 30 (A) NEGATIVE mg/dL   Nitrite NEGATIVE NEGATIVE   Leukocytes, UA NEGATIVE NEGATIVE   RBC / HPF 0-5 0 - 5 RBC/hpf   WBC, UA 0-5 0 - 5 WBC/hpf   Bacteria, UA RARE (A) NONE SEEN   Squamous Epithelial / LPF 6-30 (A) NONE SEEN   Mucous PRESENT   Sedimentation rate     Status: None   Collection Time: 12/27/15  9:59 AM  Result Value Ref Range   Sed Rate 28 0 - 30 mm/hr  C-reactive protein     Status: Abnormal   Collection Time: 12/27/15  9:59 AM  Result Value Ref Range   CRP 21.0 (H) <8.0 mg/L    Comment: ** Please note change in reference range(s). **       PHQ2/9: Depression screen [facility] 2/9 01/25/2016 12/20/2015 12/15/2015 10/22/2015 08/11/2015  Decreased Interest 0 0 0 0 0  Down, Depressed, Hopeless 0 0 0 0 0  PHQ - 2 Score 0 0 0 0 0     Fall Risk: Fall Risk  01/25/2016 12/20/2015 12/15/2015 10/22/2015 08/11/2015  Falls in the past year? No No No  No No    Functional Status Survey: Is the patient deaf or have difficulty hearing?: No Does the patient have difficulty seeing, even when wearing glasses/contacts?: No Does the patient have difficulty concentrating, remembering, or making decisions?: No Does the patient have difficulty walking or climbing  stairs?: No Does the patient have difficulty dressing or bathing?: No Does the patient have difficulty doing errands alone such as visiting a doctor's office or shopping?: No    Assessment & Plan  1. Psoriasis  Keep follow up with Dermatologist   2. Fibromyalgia syndrome  We will add Pamalor if EKG is normal  - HYDROcodone-acetaminophen (NORCO) 10-325 MG tablet; Take 1 tablet by mouth every 6 (six) hours as needed.  Dispense: 30 tablet; Refill: 0 - nortriptyline (PAMELOR) 10 MG capsule; Take 1 capsule (10 mg total) by mouth at bedtime.  Dispense: 30 capsule; Refill: 2  3. Major depressive episode  - amphetamine-dextroamphetamine (ADDERALL XR) 15 MG 24 hr capsule; Take 1 capsule by mouth every morning.  Dispense: 30 capsule; Refill: 0 - buPROPion (WELLBUTRIN XL) 300 MG 24 hr tablet; Take 1 tablet (300 mg total) by mouth daily.  Dispense: 30 tablet; Refill: 2 - citalopram (CELEXA) 40 MG tablet; Take 1 tablet (40 mg total) by mouth daily.  Dispense: 30 tablet; Refill: 2 - amphetamine-dextroamphetamine (ADDERALL XR) 15 MG 24 hr capsule; Take 1 capsule by mouth every morning.  Dispense: 30 capsule; Refill: 0 - amphetamine-dextroamphetamine (ADDERALL XR) 15 MG 24 hr capsule; Take 1 capsule by mouth every morning.  Dispense: 30 capsule; Refill: 0  4. Other specified hypothyroidism  - TSH  5. Gastro-esophageal reflux disease without esophagitis  - omeprazole (PRILOSEC) 20 MG capsule; take 1 capsule by mouth twice a day BEFORE A MEAL  Dispense: 60 capsule; Refill: 2  6. High risk medication use  We will start Nortriptyline. Discussed Gabapentin and Lyrica, but she states Gabapentin made her feel very moody  - EKG 12-Lead

## 2016-01-26 ENCOUNTER — Other Ambulatory Visit: Payer: Self-pay | Admitting: Family Medicine

## 2016-01-26 DIAGNOSIS — E039 Hypothyroidism, unspecified: Secondary | ICD-10-CM

## 2016-02-02 ENCOUNTER — Encounter: Payer: Self-pay | Admitting: *Deleted

## 2016-02-09 NOTE — Discharge Instructions (Signed)

## 2016-02-10 ENCOUNTER — Encounter
Admission: RE | Disposition: A | Discharge status: Home / Self Care | Payer: Self-pay | Source: Ambulatory Visit | Attending: Gastroenterology

## 2016-02-10 ENCOUNTER — Ambulatory Visit
Admission: RE | Admit: 2016-02-10 | Discharge: 2016-02-10 | Disposition: A | Discharge status: Home / Self Care | Payer: 59 | Source: Ambulatory Visit | Attending: Gastroenterology | Admitting: Gastroenterology

## 2016-02-10 ENCOUNTER — Ambulatory Visit: Payer: 59 | Admitting: Anesthesiology

## 2016-02-10 DIAGNOSIS — L408 Other psoriasis: Secondary | ICD-10-CM | POA: Insufficient documentation

## 2016-02-10 DIAGNOSIS — Z1211 Encounter for screening for malignant neoplasm of colon: Secondary | ICD-10-CM | POA: Diagnosis not present

## 2016-02-10 DIAGNOSIS — F329 Major depressive disorder, single episode, unspecified: Secondary | ICD-10-CM | POA: Diagnosis not present

## 2016-02-10 DIAGNOSIS — K449 Diaphragmatic hernia without obstruction or gangrene: Secondary | ICD-10-CM | POA: Insufficient documentation

## 2016-02-10 DIAGNOSIS — E039 Hypothyroidism, unspecified: Secondary | ICD-10-CM | POA: Insufficient documentation

## 2016-02-10 DIAGNOSIS — R12 Heartburn: Secondary | ICD-10-CM | POA: Diagnosis not present

## 2016-02-10 DIAGNOSIS — Z6837 Body mass index (BMI) 37.0-37.9, adult: Secondary | ICD-10-CM | POA: Diagnosis not present

## 2016-02-10 DIAGNOSIS — B009 Herpesviral infection, unspecified: Secondary | ICD-10-CM | POA: Diagnosis not present

## 2016-02-10 DIAGNOSIS — K641 Second degree hemorrhoids: Secondary | ICD-10-CM | POA: Diagnosis not present

## 2016-02-10 DIAGNOSIS — K317 Polyp of stomach and duodenum: Secondary | ICD-10-CM | POA: Diagnosis not present

## 2016-02-10 DIAGNOSIS — K635 Polyp of colon: Secondary | ICD-10-CM | POA: Diagnosis not present

## 2016-02-10 DIAGNOSIS — K219 Gastro-esophageal reflux disease without esophagitis: Secondary | ICD-10-CM | POA: Diagnosis not present

## 2016-02-10 DIAGNOSIS — Z79899 Other long term (current) drug therapy: Secondary | ICD-10-CM | POA: Diagnosis not present

## 2016-02-10 DIAGNOSIS — E669 Obesity, unspecified: Secondary | ICD-10-CM | POA: Diagnosis not present

## 2016-02-10 DIAGNOSIS — D124 Benign neoplasm of descending colon: Secondary | ICD-10-CM

## 2016-02-10 HISTORY — DX: Other psoriasis: L40.8

## 2016-02-10 HISTORY — PX: COLONOSCOPY WITH PROPOFOL: SHX5780

## 2016-02-10 HISTORY — DX: Unspecified osteoarthritis, unspecified site: M19.90

## 2016-02-10 HISTORY — PX: POLYPECTOMY: SHX5525

## 2016-02-10 HISTORY — PX: ESOPHAGOGASTRODUODENOSCOPY (EGD) WITH PROPOFOL: SHX5813

## 2016-02-10 SURGERY — COLONOSCOPY WITH PROPOFOL
Anesthesia: Monitor Anesthesia Care | Wound class: Contaminated

## 2016-02-10 MED ORDER — GLYCOPYRROLATE 0.2 MG/ML IJ SOLN
INTRAMUSCULAR | Status: DC | PRN
  Administered 2016-02-10: .2 mg via INTRAVENOUS

## 2016-02-10 MED ORDER — PROPOFOL 10 MG/ML IV BOLUS
INTRAVENOUS | Status: DC | PRN
  Administered 2016-02-10 (×6): 50 mg via INTRAVENOUS
  Administered 2016-02-10: 80 mg via INTRAVENOUS
  Administered 2016-02-10: 50 mg via INTRAVENOUS
  Administered 2016-02-10: 20 mg via INTRAVENOUS
  Administered 2016-02-10 (×2): 50 mg via INTRAVENOUS

## 2016-02-10 MED ORDER — STERILE WATER FOR IRRIGATION IR SOLN
Status: DC | PRN
  Administered 2016-02-10: 10:00:00

## 2016-02-10 MED ORDER — ACETAMINOPHEN 160 MG/5ML PO SOLN: 325.0000 mg | ORAL | Status: DC | PRN

## 2016-02-10 MED ORDER — LIDOCAINE HCL (CARDIAC) 20 MG/ML IV SOLN
INTRAVENOUS | Status: DC | PRN
  Administered 2016-02-10: 40 mg via INTRAVENOUS

## 2016-02-10 MED ORDER — ACETAMINOPHEN 325 MG PO TABS: 325.0000 mg | ORAL_TABLET | ORAL | Status: DC | PRN

## 2016-02-10 MED ORDER — LACTATED RINGERS IV SOLN
INTRAVENOUS | Status: DC
  Administered 2016-02-10: 08:00:00 via INTRAVENOUS

## 2016-02-10 SURGICAL SUPPLY — 35 items
BALLN DILATOR 10-12 8 (BALLOONS)
BALLN DILATOR 12-15 8 (BALLOONS)
BALLN DILATOR 15-18 8 (BALLOONS)
BALLN DILATOR CRE 0-12 8 (BALLOONS)
BALLN DILATOR ESOPH 8 10 CRE (MISCELLANEOUS) IMPLANT
BALLOON DILATOR 12-15 8 (BALLOONS) IMPLANT
BALLOON DILATOR 15-18 8 (BALLOONS) IMPLANT
BALLOON DILATOR CRE 0-12 8 (BALLOONS) IMPLANT
BLOCK BITE 60FR ADLT L/F GRN (MISCELLANEOUS) ×3 IMPLANT
CANISTER SUCT 1200ML W/VALVE (MISCELLANEOUS) ×3 IMPLANT
CLIP HMST 235XBRD CATH ROT (MISCELLANEOUS) IMPLANT
CLIP RESOLUTION 360 11X235 (MISCELLANEOUS)
FCP ESCP3.2XJMB 240X2.8X (MISCELLANEOUS)
FORCEPS BIOP RAD 4 LRG CAP 4 (CUTTING FORCEPS) ×1 IMPLANT
FORCEPS BIOP RJ4 240 W/NDL (MISCELLANEOUS)
FORCEPS ESCP3.2XJMB 240X2.8X (MISCELLANEOUS) IMPLANT
GOWN CVR UNV OPN BCK APRN NK (MISCELLANEOUS) ×4 IMPLANT
GOWN ISOL THUMB LOOP REG UNIV (MISCELLANEOUS) ×6
INJECTOR VARIJECT VIN23 (MISCELLANEOUS) IMPLANT
KIT DEFENDO VALVE AND CONN (KITS) IMPLANT
KIT ENDO PROCEDURE OLY (KITS) ×3 IMPLANT
MARKER SPOT ENDO TATTOO 5ML (MISCELLANEOUS) IMPLANT
PAD GROUND ADULT SPLIT (MISCELLANEOUS) IMPLANT
PROBE APC STR FIRE (PROBE) IMPLANT
RETRIEVER NET PLAT FOOD (MISCELLANEOUS) IMPLANT
RETRIEVER NET ROTH 2.5X230 LF (MISCELLANEOUS) IMPLANT
SNARE SHORT THROW 13M SML OVAL (MISCELLANEOUS) ×1 IMPLANT
SNARE SHORT THROW 30M LRG OVAL (MISCELLANEOUS) IMPLANT
SNARE SNG USE RND 15MM (INSTRUMENTS) IMPLANT
SPOT EX ENDOSCOPIC TATTOO (MISCELLANEOUS)
SYR INFLATION 60ML (SYRINGE) IMPLANT
TRAP ETRAP POLY (MISCELLANEOUS) ×1 IMPLANT
VARIJECT INJECTOR VIN23 (MISCELLANEOUS)
WATER STERILE IRR 250ML POUR (IV SOLUTION) ×3 IMPLANT
WIRE CRE 18-20MM 8CM F G (MISCELLANEOUS) IMPLANT

## 2016-02-10 NOTE — Anesthesia Preprocedure Evaluation (Signed)
Anesthesia Evaluation  Patient identified by MRN, date of birth, ID band Patient awake    Reviewed: Allergy & Precautions, H&P , NPO status , Patient's Chart, lab work & pertinent test results  Airway Mallampati: II  TM Distance: >3 FB Neck ROM: full    Dental  (+) Chipped   Pulmonary sleep apnea ,    Pulmonary exam normal        Cardiovascular Normal cardiovascular exam     Neuro/Psych PSYCHIATRIC DISORDERS    GI/Hepatic GERD  ,  Endo/Other  Hypothyroidism   Renal/GU      Musculoskeletal   Abdominal   Peds  Hematology   Anesthesia Other Findings   Reproductive/Obstetrics                             Anesthesia Physical Anesthesia Plan  ASA: II  Anesthesia Plan: MAC   Post-op Pain Management:    Induction:   Airway Management Planned:   Additional Equipment:   Intra-op Plan:   Post-operative Plan:   Informed Consent: I have reviewed the patients History and Physical, chart, labs and discussed the procedure including the risks, benefits and alternatives for the proposed anesthesia with the patient or authorized representative who has indicated his/her understanding and acceptance.     Plan Discussed with:   Anesthesia Plan Comments:         Anesthesia Quick Evaluation

## 2016-02-10 NOTE — Anesthesia Procedure Notes (Signed)
Procedure Name: MAC Date/Time: 02/10/2016 9:32 AM Performed by: Janna Arch Pre-anesthesia Checklist: Patient identified, Emergency Drugs available, Suction available and Patient being monitored Patient Re-evaluated:Patient Re-evaluated prior to inductionOxygen Delivery Method: Nasal cannula

## 2016-02-10 NOTE — Op Note (Signed)
[facility] Gastroenterology Patient Name: Debra Soto Procedure Date: 02/10/2016 9:16 AM MRN: [id number] Account #: [id number] Date of Birth: [birth year] Admit Type: Outpatient Age: 55 Room: [facility] OR ROOM 01 Gender: Female Note Status: Finalized Procedure:            Upper GI endoscopy Indications:          Heartburn Providers:            Lucilla Lame MD, MD Referring MD:         Bethena Roys. Sowles, MD (Referring MD) Medicines:            Propofol per Anesthesia Complications:        No immediate complications. Procedure:            Pre-Anesthesia Assessment:                       - Prior to the procedure, a History and Physical was                        performed, and patient medications and allergies were                        reviewed. The patient's tolerance of previous                        anesthesia was also reviewed. The risks and benefits of                        the procedure and the sedation options and risks were                        discussed with the patient. All questions were                        answered, and informed consent was obtained. Prior                        Anticoagulants: The patient has taken no previous                        anticoagulant or antiplatelet agents. ASA Grade                        Assessment: II - A patient with mild systemic disease.                        After reviewing the risks and benefits, the patient was                        deemed in satisfactory condition to undergo the                        procedure.                       After obtaining informed consent, the endoscope was                        passed under direct vision. Throughout the procedure,  the patient's blood pressure, pulse, and oxygen                        saturations were monitored continuously. The Olympus                        [id number]-HQ190 Endoscope (S#. [id number]) was introduced                        through  the mouth, and advanced to the second part of                        duodenum. The upper GI endoscopy was accomplished                        without difficulty. The patient tolerated the procedure                        well. Findings:      A small hiatal hernia was present.      A single polyp with bleeding was found at the gastroesophageal junction.       Biopsies were taken with a cold forceps for histology.      The stomach was normal.      The examined duodenum was normal. Impression:           - Small hiatal hernia.                       - Gastroesophageal junction polyp(s) were found.                        Biopsied.                       - Normal stomach.                       - Normal examined duodenum. Recommendation:       - Await pathology results.                       - Discharge patient to home.                       - Resume previous diet.                       - Continue present medications. Procedure Code(s):    --- Professional ---                       [phone], Esophagogastroduodenoscopy, flexible, transoral;                        with biopsy, single or multiple Diagnosis Code(s):    --- Professional ---                       R12, Heartburn                       K31.7, Polyp of stomach and duodenum CPT copyright 2016 [employer]. All rights reserved. The codes documented in this report are preliminary and upon coder review may  be revised  to meet current compliance requirements. Lucilla Lame MD, MD 02/10/2016 9:42:45 AM This report has been signed electronically. Number of Addenda: 0 Note Initiated On: 02/10/2016 9:16 AM Total Procedure Duration: 0 hours 4 minutes 26 seconds       [facility]

## 2016-02-10 NOTE — Transfer of Care (Signed)
Immediate Anesthesia Transfer of Care Note  Patient: Debra Soto  Procedure(s) Performed: Procedure(s) with comments: COLONOSCOPY WITH PROPOFOL (N/A) ESOPHAGOGASTRODUODENOSCOPY (EGD) WITH PROPOFOL (N/A) - sleep apnea POLYPECTOMY  Patient Location: PACU  Anesthesia Type: MAC  Level of Consciousness: awake, alert  and patient cooperative  Airway and Oxygen Therapy: Patient Spontanous Breathing and Patient connected to supplemental oxygen  Post-op Assessment: Post-op Vital signs reviewed, Patient's Cardiovascular Status Stable, Respiratory Function Stable, Patent Airway and No signs of Nausea or vomiting  Post-op Vital Signs: Reviewed and stable  Complications: No apparent anesthesia complications

## 2016-02-10 NOTE — Anesthesia Postprocedure Evaluation (Signed)
Anesthesia Post Note  Patient: Debra Soto  Procedure(s) Performed: Procedure(s) (LRB): COLONOSCOPY WITH PROPOFOL (N/A) ESOPHAGOGASTRODUODENOSCOPY (EGD) WITH PROPOFOL (N/A) POLYPECTOMY  Patient location during evaluation: PACU Anesthesia Type: MAC Level of consciousness: awake and alert and oriented Pain management: satisfactory to patient Vital Signs Assessment: post-procedure vital signs reviewed and stable Respiratory status: spontaneous breathing, nonlabored ventilation and respiratory function stable Cardiovascular status: blood pressure returned to baseline and stable Postop Assessment: Adequate PO intake and No signs of nausea or vomiting Anesthetic complications: no    Raliegh Ip

## 2016-02-10 NOTE — Op Note (Signed)
[facility] Gastroenterology Patient Name: Debra Soto Procedure Date: 02/10/2016 9:15 AM MRN: [id number] Account #: [id number] Date of Birth: December 29, 1960 Admit Type: Outpatient Age: 55 Room: [facility] OR ROOM 01 Gender: Female Note Status: Finalized Procedure:            Colonoscopy Indications:          Screening for colorectal malignant neoplasm Providers:            Lucilla Lame MD, MD Medicines:            Propofol per Anesthesia Complications:        No immediate complications. Procedure:            Pre-Anesthesia Assessment:                       - Prior to the procedure, a History and Physical was                        performed, and patient medications and allergies were                        reviewed. The patient's tolerance of previous                        anesthesia was also reviewed. The risks and benefits of                        the procedure and the sedation options and risks were                        discussed with the patient. All questions were                        answered, and informed consent was obtained. Prior                        Anticoagulants: The patient has taken no previous                        anticoagulant or antiplatelet agents. ASA Grade                        Assessment: II - A patient with mild systemic disease.                        After reviewing the risks and benefits, the patient was                        deemed in satisfactory condition to undergo the                        procedure.                       After obtaining informed consent, the colonoscope was                        passed under direct vision. Throughout the procedure,                        the patient's blood pressure,  pulse, and oxygen                        saturations were monitored continuously. The Olympus                        CF-HQ190L Colonoscope (S#. [id number]) was introduced                        through the anus and advanced to the  the cecum,                        identified by appendiceal orifice and ileocecal valve.                        The colonoscopy was performed without difficulty. The                        patient tolerated the procedure well. The quality of                        the bowel preparation was excellent. Findings:      The perianal and digital rectal examinations were normal.      A 8 mm polyp was found in the descending colon. The polyp was sessile.       The polyp was removed with a cold snare. Resection and retrieval were       complete.      Non-bleeding internal hemorrhoids were found during retroflexion. The       hemorrhoids were Grade II (internal hemorrhoids that prolapse but reduce       spontaneously). Impression:           - One 8 mm polyp in the descending colon, removed with                        a cold snare. Resected and retrieved.                       - Non-bleeding internal hemorrhoids. Recommendation:       - Discharge patient to home.                       - Resume previous diet.                       - Continue present medications.                       - Repeat colonoscopy in 5 years if polyp adenoma and 10                        years if hyperplastic Procedure Code(s):    --- Professional ---                       [phone], Colonoscopy, flexible; with removal of tumor(s),                        polyp(s), or other lesion(s) by snare technique Diagnosis Code(s):    --- Professional ---  Z12.11, Encounter for screening for malignant neoplasm                        of colon                       D12.4, Benign neoplasm of descending colon CPT copyright 2016 [employer] Association. All rights reserved. The codes documented in this report are preliminary and upon coder review may  be revised to meet current compliance requirements. Lucilla Lame MD, MD 02/10/2016 9:55:48 AM This report has been signed electronically. Number of Addenda: 0 Note  Initiated On: 02/10/2016 9:15 AM Scope Withdrawal Time: 0 hours 6 minutes 50 seconds  Total Procedure Duration: 0 hours 9 minutes 35 seconds       [facility], [facility], [facility]

## 2016-02-10 NOTE — H&P (Signed)
Lucilla Lame, MD [address] [place], [place] [address] Phone: [phone] Fax : [phone]  Primary Care Physician:  Loistine Chance, MD Primary Gastroenterologist:  Dr. Allen Norris  Pre-Procedure History & Physical: HPI:  Debra Soto is a 55 y.o. female is here for an endoscopy and colonoscopy.   Past Medical History:  Diagnosis Date  . Allergy   . Arthritis   . Depression   . Erythrodermic psoriasis   . Fibromyalgia   . GERD (gastroesophageal reflux disease)   . Hyperlipemia   . Insomnia   . Obesity   . Recurrent HSV (herpes simplex virus)   . Sleep apnea    pt states does not use CPAP  . Thyroid disease   . Vitamin D deficiency     Past Surgical History:  Procedure Laterality Date  . ABDOMINAL HYSTERECTOMY  [id number]  . bladder tact    . CHOLECYSTECTOMY  [id number]  . COLONOSCOPY    . ESOPHAGOGASTRODUODENOSCOPY    . LIPOMA EXCISION    . TONSILECTOMY, ADENOIDECTOMY, BILATERAL MYRINGOTOMY AND TUBES      Prior to Admission medications   Medication Sig Start Date End Date Taking? Authorizing Provider  amphetamine-dextroamphetamine (ADDERALL XR) 15 MG 24 hr capsule Take 1 capsule by mouth every morning. 01/25/16  Yes Steele Sizer, MD  betamethasone dipropionate (DIPROLENE) 0.05 % cream apply A THIN LAYER to affected area twice a day UNTIL CLEAR 01/11/16  Yes Historical Provider, MD  buPROPion (WELLBUTRIN XL) 300 MG 24 hr tablet Take 1 tablet (300 mg total) by mouth daily. 01/25/16  Yes Steele Sizer, MD  Ca Phosphate-Cholecalciferol (CALCIUM/VITAMIN D3 GUMMIES) 200-200 MG-UNIT CHEW Chew 1 each by mouth daily.   Yes Historical Provider, MD  citalopram (CELEXA) 40 MG tablet Take 1 tablet (40 mg total) by mouth daily. 01/25/16  Yes Steele Sizer, MD  HYDROcodone-acetaminophen (NORCO) 10-325 MG tablet Take 1 tablet by mouth every 6 (six) hours as needed. 01/25/16  Yes Steele Sizer, MD  levothyroxine (SYNTHROID, LEVOTHROID) 200 MCG tablet take 1 tablet by  mouth once daily 11/30/15  Yes Steele Sizer, MD  nortriptyline (PAMELOR) 10 MG capsule Take 1 capsule (10 mg total) by mouth at bedtime. 01/25/16  Yes Steele Sizer, MD  omeprazole (PRILOSEC) 20 MG capsule take 1 capsule by mouth twice a day BEFORE A MEAL 01/25/16  Yes Steele Sizer, MD  valACYclovir (VALTREX) 1000 MG tablet Take 1 tablet by mouth as needed. 04/08/14  Yes Historical Provider, MD  amphetamine-dextroamphetamine (ADDERALL XR) 15 MG 24 hr capsule Take 1 capsule by mouth every morning. 01/25/16   Steele Sizer, MD  amphetamine-dextroamphetamine (ADDERALL XR) 15 MG 24 hr capsule Take 1 capsule by mouth every morning. 01/25/16   Steele Sizer, MD  baclofen (LIORESAL) 10 MG tablet Take 1 tablet (10 mg total) by mouth as needed. Patient not taking: Reported on 02/02/2016 12/31/14   Steele Sizer, MD    Allergies as of 01/24/2016  . (No Known Allergies)    Family History  Problem Relation Age of Onset  . Hypertension Mother   . Arthritis Mother   . Cancer Mother     ovarian,breast, lung, liver  . Breast cancer Mother 72  . Ovarian cancer Mother 44  . Lung cancer Mother 28  . Diabetes Brother   . Hypertension Brother   . Other Father     lung problems  . Ovarian cancer Maternal Grandmother     pt thinks is was ovarian but not sure    Social  History   Social History  . Marital status: Married    Spouse name: N/A  . Number of children: N/A  . Years of education: N/A   Occupational History  . Not on file.   Social History Main Topics  . Smoking status: Never Smoker  . Smokeless tobacco: Never Used  . Alcohol use 0.0 oz/week     Comment: rarely  . Drug use: No  . Sexual activity: Yes   Other Topics Concern  . Not on file   Social History Narrative  . No narrative on file    Review of Systems: See HPI, otherwise negative ROS  Physical Exam: BP 127/78   Pulse 78   Temp 97.7 F (36.5 C) (Tympanic)   Resp 18   Ht 5\' 8"  (1.727 m)   Wt 249 lb (112.9  kg)   SpO2 100%   BMI 37.86 kg/m  General:   Alert,  pleasant and cooperative in NAD Head:  Normocephalic and atraumatic. Neck:  Supple; no masses or thyromegaly. Lungs:  Clear throughout to auscultation.    Heart:  Regular rate and rhythm. Abdomen:  Soft, nontender and nondistended. Normal bowel sounds, without guarding, and without rebound.   Neurologic:  Alert and  oriented x4;  grossly normal neurologically.  Impression/Plan: Debra Soto is here for an endoscopy and colonoscopy to be performed for GERD and screening  Risks, benefits, limitations, and alternatives regarding  endoscopy and colonoscopy have been reviewed with the patient.  Questions have been answered.  All parties agreeable.   Lucilla Lame, MD  02/10/2016, 9:17 AM

## 2016-02-15 ENCOUNTER — Encounter: Payer: Self-pay | Admitting: Gastroenterology

## 2016-02-17 ENCOUNTER — Encounter: Payer: Self-pay | Admitting: Gastroenterology

## 2016-02-25 ENCOUNTER — Encounter: Payer: 59 | Admitting: Family Medicine

## 2016-03-01 ENCOUNTER — Other Ambulatory Visit: Payer: Self-pay | Admitting: Family Medicine

## 2016-03-01 NOTE — Telephone Encounter (Signed)
Patient requesting refill of Levothyroxine to [facility].

## 2016-03-03 NOTE — Telephone Encounter (Signed)
Per the request of Dr. Steele Sizer, I contacted this patient to see exactly how she is taking her Levothyroxine 259mcg. She stated that she takes 260mcg on one day then 164mcg on the other day. After consulting with Dr. Steele Sizer, the patient was informed that the refill was submitted and she was reminded to come back in for repeat labs in January.  She will be out of meds: Celexa and Wellbutrin by then and will need a refill.

## 2016-03-18 ENCOUNTER — Other Ambulatory Visit: Payer: Self-pay | Admitting: Family Medicine

## 2016-03-18 DIAGNOSIS — K219 Gastro-esophageal reflux disease without esophagitis: Secondary | ICD-10-CM

## 2016-03-20 NOTE — Telephone Encounter (Signed)
Patient requesting refill of Omeprazole to [facility].  

## 2016-03-28 ENCOUNTER — Other Ambulatory Visit: Payer: Self-pay | Admitting: Family Medicine

## 2016-03-28 DIAGNOSIS — K219 Gastro-esophageal reflux disease without esophagitis: Secondary | ICD-10-CM

## 2016-03-28 NOTE — Telephone Encounter (Signed)
Patient requesting refill of Omeprazole to [facility].  

## 2016-04-26 ENCOUNTER — Ambulatory Visit: Payer: 59 | Admitting: Family Medicine

## 2016-05-09 ENCOUNTER — Telehealth: Payer: Self-pay | Admitting: Family Medicine

## 2016-05-09 DIAGNOSIS — K219 Gastro-esophageal reflux disease without esophagitis: Secondary | ICD-10-CM

## 2016-05-10 NOTE — Telephone Encounter (Signed)
Patient requesting refill of Levothyroxine and Omeprazole to [facility].

## 2016-05-16 NOTE — Telephone Encounter (Signed)
appt scheduled for 05-17-16

## 2016-05-17 ENCOUNTER — Ambulatory Visit: Payer: 59 | Admitting: Family Medicine

## 2016-06-29 ENCOUNTER — Other Ambulatory Visit: Payer: Self-pay | Admitting: Family Medicine

## 2016-06-29 ENCOUNTER — Encounter: Payer: Self-pay | Admitting: Family Medicine

## 2016-06-29 ENCOUNTER — Ambulatory Visit (INDEPENDENT_AMBULATORY_CARE_PROVIDER_SITE_OTHER): Payer: 59 | Admitting: Family Medicine

## 2016-06-29 VITALS — BP 120/76 | HR 94 | Temp 97.5°F | Resp 18 | Ht 68.0 in | Wt 251.5 lb

## 2016-06-29 DIAGNOSIS — L409 Psoriasis, unspecified: Secondary | ICD-10-CM

## 2016-06-29 DIAGNOSIS — E559 Vitamin D deficiency, unspecified: Secondary | ICD-10-CM | POA: Diagnosis not present

## 2016-06-29 DIAGNOSIS — B001 Herpesviral vesicular dermatitis: Secondary | ICD-10-CM

## 2016-06-29 DIAGNOSIS — M797 Fibromyalgia: Secondary | ICD-10-CM

## 2016-06-29 DIAGNOSIS — R739 Hyperglycemia, unspecified: Secondary | ICD-10-CM

## 2016-06-29 DIAGNOSIS — Z79899 Other long term (current) drug therapy: Secondary | ICD-10-CM

## 2016-06-29 DIAGNOSIS — Z87898 Personal history of other specified conditions: Secondary | ICD-10-CM

## 2016-06-29 DIAGNOSIS — K219 Gastro-esophageal reflux disease without esophagitis: Secondary | ICD-10-CM | POA: Diagnosis not present

## 2016-06-29 DIAGNOSIS — R5383 Other fatigue: Secondary | ICD-10-CM | POA: Diagnosis not present

## 2016-06-29 DIAGNOSIS — E039 Hypothyroidism, unspecified: Secondary | ICD-10-CM | POA: Diagnosis not present

## 2016-06-29 DIAGNOSIS — K130 Diseases of lips: Secondary | ICD-10-CM

## 2016-06-29 DIAGNOSIS — F339 Major depressive disorder, recurrent, unspecified: Secondary | ICD-10-CM

## 2016-06-29 DIAGNOSIS — E785 Hyperlipidemia, unspecified: Secondary | ICD-10-CM

## 2016-06-29 LAB — CBC WITH DIFFERENTIAL/PLATELET
Basophils Absolute: 62 cells/uL (ref 0–200)
Basophils Relative: 1 %
EOS PCT: 1 %
Eosinophils Absolute: 62 cells/uL (ref 15–500)
HCT: 35.2 % (ref 35.0–45.0)
Hemoglobin: 10.8 g/dL — ABNORMAL LOW (ref 11.7–15.5)
LYMPHS ABS: 2294 {cells}/uL (ref 850–3900)
Lymphocytes Relative: 37 %
MCH: 23.4 pg — ABNORMAL LOW (ref 27.0–33.0)
MCHC: 30.7 g/dL — AB (ref 32.0–36.0)
MCV: 76.2 fL — ABNORMAL LOW (ref 80.0–100.0)
MPV: 9.6 fL (ref 7.5–12.5)
Monocytes Absolute: 372 cells/uL (ref 200–950)
Monocytes Relative: 6 %
NEUTROS PCT: 55 %
Neutro Abs: 3410 cells/uL (ref 1500–7800)
Platelets: 415 10*3/uL — ABNORMAL HIGH (ref 140–400)
RBC: 4.62 MIL/uL (ref 3.80–5.10)
RDW: 16.4 % — AB (ref 11.0–15.0)
WBC: 6.2 10*3/uL (ref 3.8–10.8)

## 2016-06-29 LAB — TSH: TSH: 2.68 m[IU]/L

## 2016-06-29 MED ORDER — AMPHETAMINE-DEXTROAMPHET ER 15 MG PO CP24: 15.0000 mg | ORAL_CAPSULE | ORAL | 0 refills | Status: DC

## 2016-06-29 MED ORDER — DULOXETINE HCL 30 MG PO CPEP: 30.0000 mg | ORAL_CAPSULE | Freq: Every day | ORAL | 0 refills | Status: DC

## 2016-06-29 MED ORDER — BUPROPION HCL ER (XL) 300 MG PO TB24: 300.0000 mg | ORAL_TABLET | Freq: Every day | ORAL | 5 refills | Status: DC

## 2016-06-29 MED ORDER — CITALOPRAM HYDROBROMIDE 40 MG PO TABS: 40.0000 mg | ORAL_TABLET | Freq: Every day | ORAL | 5 refills | Status: DC

## 2016-06-29 MED ORDER — VALACYCLOVIR HCL 1 G PO TABS: 1000.0000 mg | ORAL_TABLET | Freq: Two times a day (BID) | ORAL | 0 refills | Status: DC | PRN

## 2016-06-29 MED ORDER — OMEPRAZOLE 20 MG PO CPDR: DELAYED_RELEASE_CAPSULE | ORAL | 5 refills | Status: DC

## 2016-06-29 MED ORDER — SCOPOLAMINE 1 MG/3DAYS TD PT72: 1.0000 | MEDICATED_PATCH | TRANSDERMAL | 0 refills | Status: DC

## 2016-06-29 MED ORDER — NYSTATIN-TRIAMCINOLONE 100000-0.1 UNIT/GM-% EX OINT: 1.0000 "application " | TOPICAL_OINTMENT | Freq: Two times a day (BID) | CUTANEOUS | 0 refills | Status: DC

## 2016-06-29 NOTE — Progress Notes (Signed)
Name: Debra Soto   MRN: [id number]    DOB: [birth year]   Date:06/29/2016       Progress Note  Subjective  Chief Complaint  Chief Complaint  Patient presents with  . Insomnia    Unchanged  . Depression    Patient state she does "not feel so great" since she is out of her med  . Gastroesophageal Reflux    same  . Hypothyroidism  . Medication Refill    4 month F/U    HPI  Psoriasis: she had a rash that was very painful and all over her body, she was given prednisone by mouth in October without much help, seen by Dermatologist and is on steroid cream after a biopsy was positive for psoriasis, doing well at this time, still has medication at home  Hypothyroidism:taking levothyroxine 200 mcg daily ( only skipping medication occasionally) she has dry skin, no constipation, she has fatigue but could be from [place]  Major Depression: she has been out of  Citalopram, Wellbutrin and Adderal, for the past few weeks. She is getting tearful, lack of energy, difficulty focusing, aching all over, she wants to go back on medication. She denies suicidal thoughts or ideation  FMS: only taking Ibuprofen for FMS at this time, she stopped Pamelor.  Pain right now is 3/10 all over her body she also mental fogginess, affects her sleep. We will try changing anti-depressants to Cymbalta, and stop prn hydrodocodone  Dyslipidemia: I discussed ASCVD and is low at 2.1% no need for statin therapy.   GERD: she is taking Omeprazole twice daily and keeps symptoms under control. Discussed risk of Alzheimer's, c.difi colitis and she tried to wean self off but symptoms got worse , so she is back on Omeprazole, symptoms under control    Patient Active Problem List   Diagnosis Date Noted  . Special screening for malignant neoplasms, colon   . Benign neoplasm of descending colon   . Heartburn   . Gastric polyp   . Psoriasis 01/25/2016  . 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 Endometriosis 12/31/2014  .  Insomnia, persistent 09/29/2014  . IBS (irritable bowel syndrome) 09/29/2014  . 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 Dyslipidemia 09/29/2014  . Fibromyalgia syndrome 09/29/2014  . Gastro-esophageal reflux disease without esophagitis 09/29/2014  . Herpes labialis 09/29/2014  . Adult hypothyroidism 09/29/2014  . Family history of malignant neoplasm of breast 09/29/2014  . Extreme obesity 09/29/2014  . Major depressive episode 09/29/2014  . Obstructive apnea 09/29/2014  . Abnormal presence of protein in urine 09/29/2014  . Allergic rhinitis 09/29/2014  . Vitamin D deficiency 09/29/2014  . Urge incontinence 09/29/2014  . Headache, tension-type 09/29/2014  . Menopausal symptom 09/29/2014    Past Surgical History:  Procedure Laterality Date  . ABDOMINAL HYSTERECTOMY  [id number]  . bladder tact    . CHOLECYSTECTOMY  [id number]  . COLONOSCOPY    . COLONOSCOPY WITH PROPOFOL N/A 02/10/2016   Procedure: COLONOSCOPY WITH PROPOFOL;  Surgeon: Lucilla Lame, MD;  Location: [place];  Service: Endoscopy;  Laterality: N/A;  . ESOPHAGOGASTRODUODENOSCOPY    . ESOPHAGOGASTRODUODENOSCOPY (EGD) WITH PROPOFOL N/A 02/10/2016   Procedure: ESOPHAGOGASTRODUODENOSCOPY (EGD) WITH PROPOFOL;  Surgeon: Lucilla Lame, MD;  Location: [place];  Service: Endoscopy;  Laterality: N/A;  sleep apnea  . LIPOMA EXCISION    . POLYPECTOMY  02/10/2016   Procedure: POLYPECTOMY;  Surgeon: Lucilla Lame, MD;  Location: [place];  Service: Endoscopy;;  . TONSILECTOMY, ADENOIDECTOMY, BILATERAL MYRINGOTOMY AND TUBES  Family History  Problem Relation Age of Onset  . Hypertension Mother   . Arthritis Mother   . Cancer Mother     ovarian,breast, lung, liver  . Breast cancer Mother 7  . Ovarian cancer Mother 62  . Lung cancer Mother 23  . Diabetes Brother   . Hypertension Brother   . Other Father     lung problems  . Ovarian cancer Maternal Grandmother     pt thinks is was ovarian but not  sure    Social History   Social History  . Marital status: Married    Spouse name: N/A  . Number of children: N/A  . Years of education: N/A   Occupational History  . Not on file.   Social History Main Topics  . Smoking status: Never Smoker  . Smokeless tobacco: Never Used  . Alcohol use 0.0 oz/week     Comment: rarely  . Drug use: No  . Sexual activity: Yes   Other Topics Concern  . Not on file   Social History Narrative  . No narrative on file     Current Outpatient Prescriptions:  .  betamethasone dipropionate (DIPROLENE) 0.05 % cream, apply A THIN LAYER to affected area twice a day UNTIL CLEAR, Disp: , Rfl: 0 .  buPROPion (WELLBUTRIN XL) 300 MG 24 hr tablet, Take 1 tablet (300 mg total) by mouth daily., Disp: 30 tablet, Rfl: 5 .  Ca Phosphate-Cholecalciferol (CALCIUM/VITAMIN D3 GUMMIES) 200-200 MG-UNIT CHEW, Chew 1 each by mouth daily., Disp: , Rfl:  .  citalopram (CELEXA) 40 MG tablet, Take 1 tablet (40 mg total) by mouth daily., Disp: 30 tablet, Rfl: 5 .  levothyroxine (SYNTHROID, LEVOTHROID) 200 MCG tablet, take 1 tablet by mouth once daily, Disp: 30 tablet, Rfl: 0 .  omeprazole (PRILOSEC) 20 MG capsule, take 1 capsule by mouth twice a day BEFORE A MEAL, Disp: 60 capsule, Rfl: 5 .  valACYclovir (VALTREX) 1000 MG tablet, Take 1 tablet (1,000 mg total) by mouth 2 (two) times daily as needed., Disp: 30 tablet, Rfl: 0 .  amphetamine-dextroamphetamine (ADDERALL XR) 15 MG 24 hr capsule, Take 1 capsule by mouth every morning., Disp: 30 capsule, Rfl: 0 .  amphetamine-dextroamphetamine (ADDERALL XR) 15 MG 24 hr capsule, Take 1 capsule by mouth every morning., Disp: 30 capsule, Rfl: 0 .  amphetamine-dextroamphetamine (ADDERALL XR) 15 MG 24 hr capsule, Take 1 capsule by mouth every morning., Disp: 30 capsule, Rfl: 0  No Known Allergies   ROS  Constitutional: Negative for fever or weight change.  Respiratory: Negative for cough and shortness of breath.   Cardiovascular:  Negative for chest pain or palpitations.  Gastrointestinal: Negative for abdominal pain, no bowel changes.  Musculoskeletal: Negative for gait problem or joint swelling.  Skin: Negative for rash.  Neurological: Negative for dizziness or headache.  No other specific complaints in a complete review of systems (except as listed in HPI above).  Objective  Vitals:   06/29/16 1004  BP: 120/76  Pulse: 94  Resp: 18  Temp: 97.5 F (36.4 C)  TempSrc: Oral  SpO2: 97%  Weight: 251 lb 8 oz (114.1 kg)  Height: 5\' 8"  (1.727 m)    Body mass index is 38.24 kg/m.  Physical Exam  Constitutional: Patient appears well-developed and well-nourished. Obese  No distress.  HEENT: head atraumatic, normocephalic, pupils equal and reactive to light,  neck supple, throat within normal limits Cardiovascular: Normal rate, regular rhythm and normal heart sounds.  No murmur heard. No BLE  edema. Pulmonary/Chest: Effort normal and breath sounds normal. No respiratory distress. Abdominal: Soft.  There is no tenderness. Psychiatric: Patient has a normal mood and affect. behavior is normal. Judgment and thought content normal.  PHQ2/9: Depression screen [facility] 2/9 06/29/2016 01/25/2016 12/20/2015 12/15/2015 10/22/2015  Decreased Interest 2 0 0 0 0  Down, Depressed, Hopeless - 0 0 0 0  PHQ - 2 Score 2 0 0 0 0  Altered sleeping 3 - - - -  Change in appetite (No Data) - - - -  Feeling bad or failure about yourself  0 - - - -  Trouble concentrating 0 - - - -  Moving slowly or fidgety/restless 0 - - - -  Suicidal thoughts 0 - - - -  PHQ-9 Score 5 - - - -  Difficult doing work/chores Not difficult at all - - - -     Fall Risk: Fall Risk  06/29/2016 01/25/2016 12/20/2015 12/15/2015 10/22/2015  Falls in the past year? No No No No No     Functional Status Survey: Is the patient deaf or have difficulty hearing?: No Does the patient have difficulty seeing, even when wearing glasses/contacts?: No Does the patient have  difficulty concentrating, remembering, or making decisions?: No Does the patient have difficulty walking or climbing stairs?: No Does the patient have difficulty dressing or bathing?: No Does the patient have difficulty doing errands alone such as visiting a doctor's office or shopping?: No    Assessment & Plan  1. Gastro-esophageal reflux disease without esophagitis  - omeprazole (PRILOSEC) 20 MG capsule; take 1 capsule by mouth twice a day BEFORE A MEAL  Dispense: 60 capsule; Refill: 5  2. Psoriasis  Seen by Dermatologist and is doing well at this time  3. Fibromyalgia syndrome  Off Elavil   4. Major depression, recurrent, chronic (HCC)  - amphetamine-dextroamphetamine (ADDERALL XR) 15 MG 24 hr capsule; Take 1 capsule by mouth every morning.  Dispense: 30 capsule; Refill: 0 - amphetamine-dextroamphetamine (ADDERALL XR) 15 MG 24 hr capsule; Take 1 capsule by mouth every morning.  Dispense: 30 capsule; Refill: 0 - amphetamine-dextroamphetamine (ADDERALL XR) 15 MG 24 hr capsule; Take 1 capsule by mouth every morning.  Dispense: 30 capsule; Refill: 0  She has been out of Wellbutrin and Citalopram for weeks, and since she has FMS we will try changing to Cymbalta and see how she responds - DULoxetine (CYMBALTA) 30 MG capsule; Take 1-2 capsules (30-60 mg total) by mouth daily. First week 30 mg and after that 2 daily  Dispense: 60 capsule; Refill: 0  5. Other specified hypothyroidism  -TSH  6. Fever blister  - valACYclovir (VALTREX) 1000 MG tablet; Take 1 tablet (1,000 mg total) by mouth 2 (two) times daily as needed.  Dispense: 30 tablet; Refill: 0  7. Vitamin D deficiency  - VITAMIN D 25 Hydroxy (Vit-D Deficiency, Fractures)  8. Dyslipidemia  - Lipid panel  9. Hyperglycemia  - Hemoglobin A1c - Insulin, fasting  10. Long-term use of high-risk medication  - COMPLETE METABOLIC PANEL WITH GFR  11. Other fatigue  - CBC with Differential/Platelet - Vitamin B12  12.  Hx of motion sickness  - scopolamine (TRANSDERM-SCOP) 1 MG/3DAYS; Place 1 patch (1.5 mg total) onto the skin every 3 (three) days. Apply 4 hours prior to getting in the cruise  Dispense: 2 patch; Refill: 0   13. Cheilitis  - nystatin-triamcinolone ointment (MYCOLOG); Apply 1 application topically 2 (two) times daily.  Dispense: 30 g; Refill: 0

## 2016-06-30 LAB — COMPLETE METABOLIC PANEL WITH GFR
ALT: 18 U/L (ref 6–29)
AST: 15 U/L (ref 10–35)
Albumin: 3.8 g/dL (ref 3.6–5.1)
Alkaline Phosphatase: 117 U/L (ref 33–130)
BUN: 16 mg/dL (ref 7–25)
CHLORIDE: 107 mmol/L (ref 98–110)
CO2: 24 mmol/L (ref 20–31)
Calcium: 9.1 mg/dL (ref 8.6–10.4)
Creat: 0.94 mg/dL (ref 0.50–1.05)
GFR, Est African American: 78 mL/min (ref >=60)
GFR, Est Non African American: 68 mL/min (ref >=60)
GLUCOSE: 88 mg/dL (ref 65–99)
POTASSIUM: 4.3 mmol/L (ref 3.5–5.3)
Sodium: 140 mmol/L (ref 135–146)
Total Bilirubin: 0.3 mg/dL (ref 0.2–1.2)
Total Protein: 7.1 g/dL (ref 6.1–8.1)

## 2016-06-30 LAB — LIPID PANEL
CHOL/HDL RATIO: 4.2 ratio (ref <5.0)
CHOLESTEROL: 221 mg/dL — AB (ref <200)
HDL: 53 mg/dL (ref >50)
LDL Cholesterol: 140 mg/dL — ABNORMAL HIGH (ref <100)
TRIGLYCERIDES: 141 mg/dL (ref <150)
VLDL: 28 mg/dL (ref <30)

## 2016-06-30 LAB — VITAMIN D 25 HYDROXY (VIT D DEFICIENCY, FRACTURES): Vit D, 25-Hydroxy: 29 ng/mL — ABNORMAL LOW (ref 30–100)

## 2016-06-30 LAB — HEMOGLOBIN A1C
Hgb A1c MFr Bld: 5.1 % (ref <5.7)
Mean Plasma Glucose: 100 mg/dL

## 2016-06-30 LAB — VITAMIN B12: Vitamin B-12: 366 pg/mL (ref 200–1100)

## 2016-06-30 LAB — INSULIN, FASTING: Insulin fasting, serum: 12.5 u[IU]/mL (ref 2.0–19.6)

## 2016-07-02 ENCOUNTER — Other Ambulatory Visit: Payer: Self-pay | Admitting: Family Medicine

## 2016-07-02 DIAGNOSIS — D649 Anemia, unspecified: Secondary | ICD-10-CM

## 2016-07-23 ENCOUNTER — Other Ambulatory Visit: Payer: Self-pay | Admitting: Family Medicine

## 2016-07-23 DIAGNOSIS — F339 Major depressive disorder, recurrent, unspecified: Secondary | ICD-10-CM

## 2016-07-23 DIAGNOSIS — M797 Fibromyalgia: Secondary | ICD-10-CM

## 2016-07-26 ENCOUNTER — Other Ambulatory Visit: Payer: Self-pay | Admitting: Family Medicine

## 2016-07-26 DIAGNOSIS — E038 Other specified hypothyroidism: Secondary | ICD-10-CM

## 2016-09-09 IMAGING — US US BREAST*L* LIMITED INC AXILLA
1 series · 9 of 9 positions shown · non-contrast
Comparison: 06/03/2010, 12/06/2007, 12/05/2006.

CLINICAL DATA: Focal tenderness within the lateral left breast.
There is a family history of breast cancer in the patient's mother
at age 60.

EXAM:
2D DIGITAL DIAGNOSTIC BILATERAL MAMMOGRAM WITH CAD AND ADJUNCT TOMO
ULTRASOUND BILATERAL BREAST

[Series 1: us breast*left* limited inc axilla · 0.07mm/px · 9 of 9 slices shown]
[im 1/9]
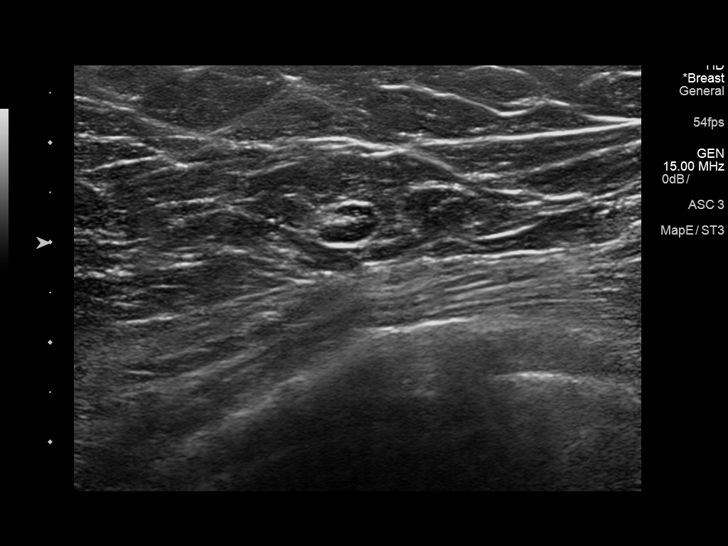
[im 2/9]
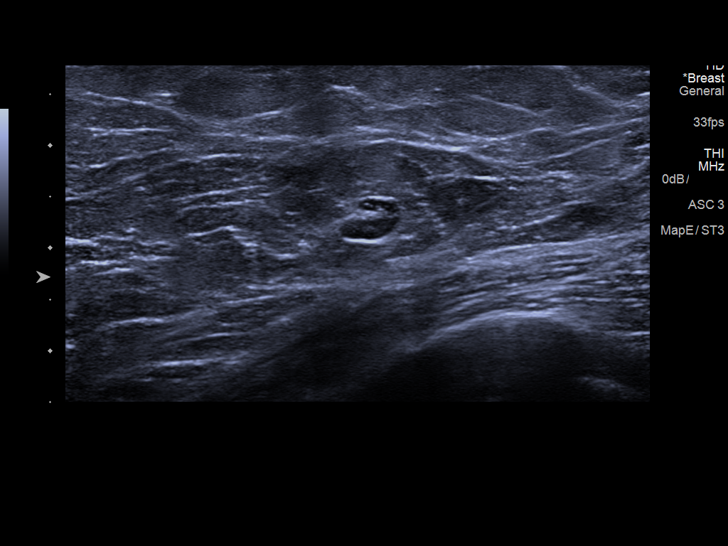
[im 3/9]
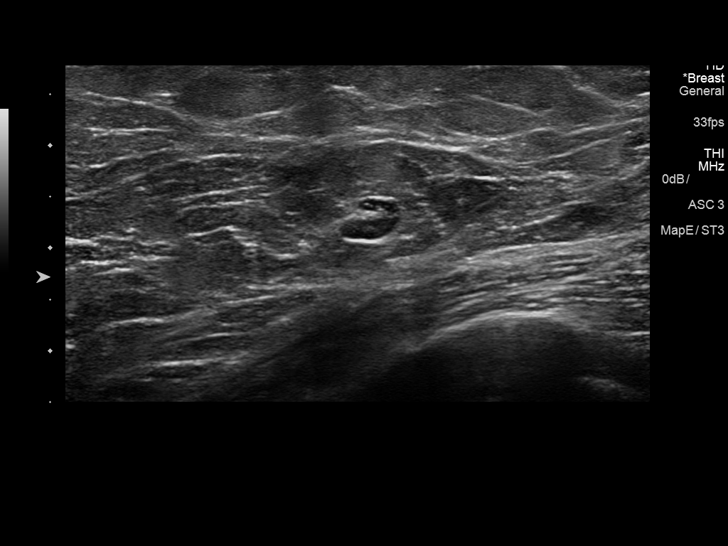
[im 4/9]
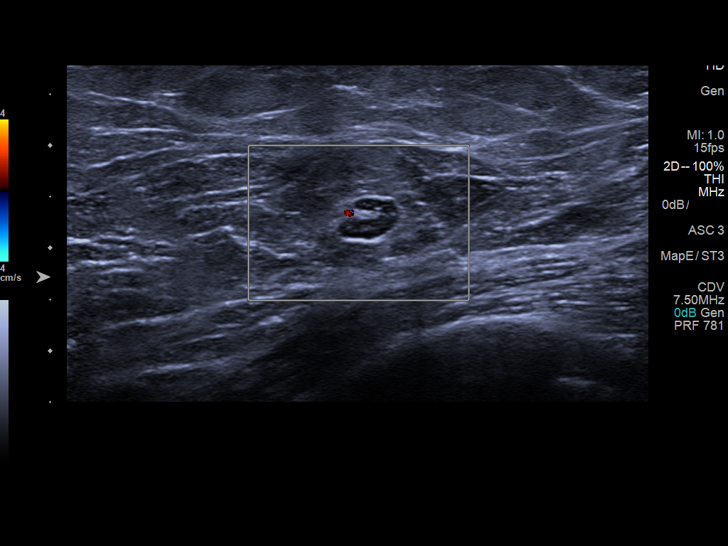
[im 5/9]
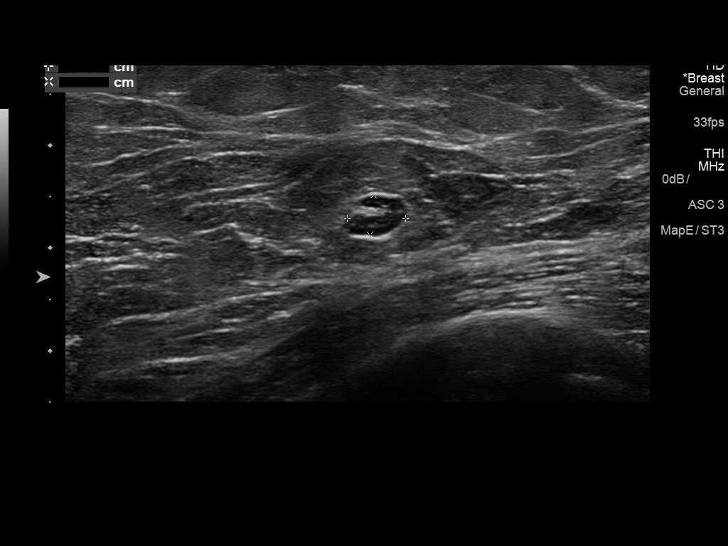
[im 6/9]
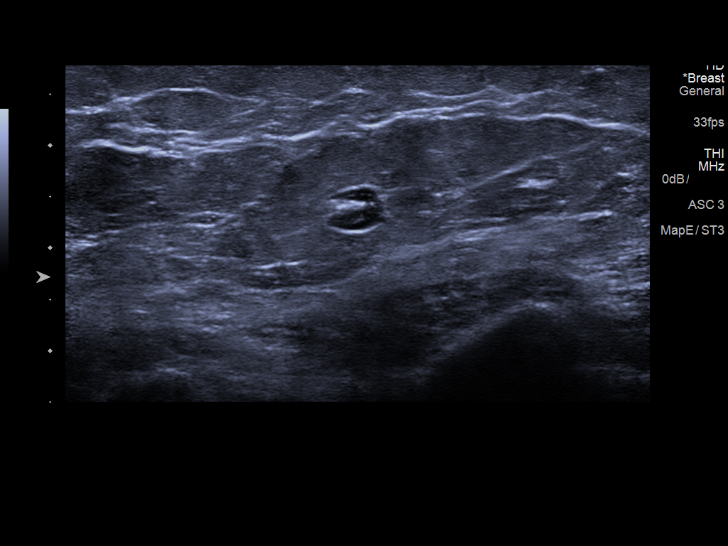
[im 7/9]
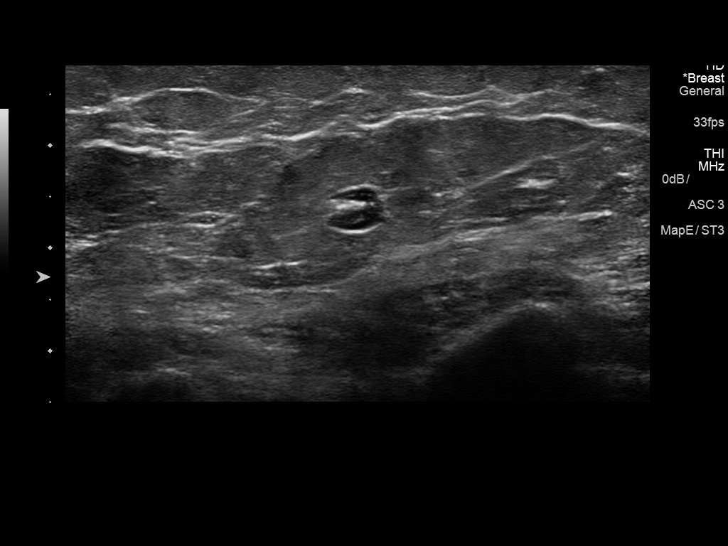
[im 8/9]
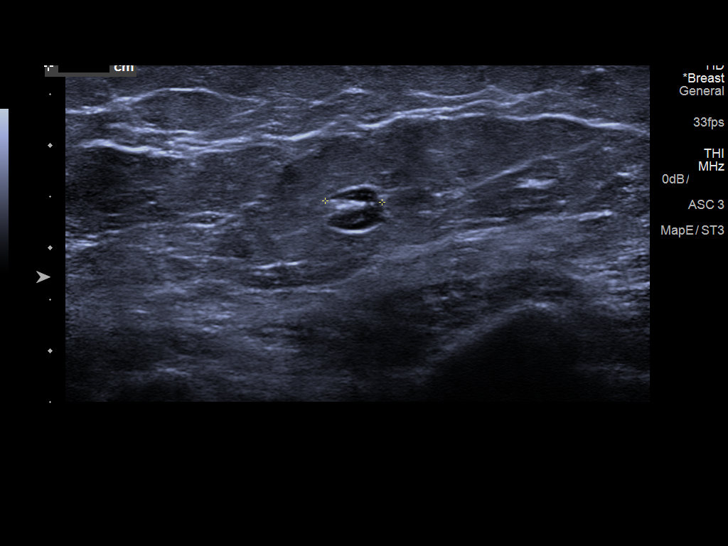
[im 9/9]
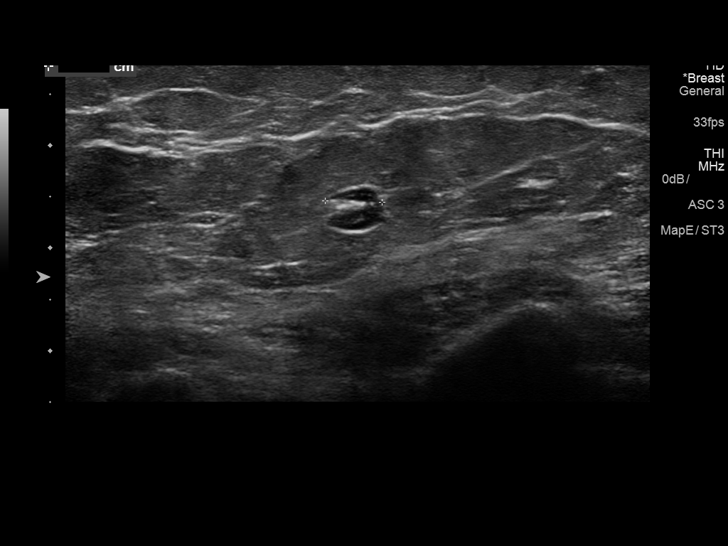

[9 of 9 positions shown; findings below may reference images not displayed]

ACR Breast Density Category b: There are scattered areas of
fibroglandular density.
FINDINGS: There is an oval, circumscribed mass located medially within the
right breast at approximately the 2:30 o'clock position which
appears stable when compared with the prior study. There is a second
oval, circumscribed mass located within the right breast at
approximately the 5 o'clock position. This is not definitely
visualized on the prior study but could be obscured by adjacent
dense breast tissue. There are scattered punctate
microcalcifications present bilaterally particularly laterally
located. These are stable. There are no worrisome linear or
branching forms. In the area of the patient's focal pain located
laterally within the left breast there is fatty tissue. There is no
mass, distortion, or worrisome calcification within either breast.

Mammographic images were processed with CAD.

On physical exam, there is no discrete palpable abnormality within
the lateral left breast in the area the patient's focal pain. Also,
there is no discrete palpable abnormality within the medial right
breast or inferior right breast.

Targeted ultrasound is performed, showing an oval, circumscribed,
parallel hypoechoic mass located within the right breast at the 2:30
o'clock position 3 cm from the nipple measuring 10 x 9 x 4 mm in
size. This is stable mammographically for 5 years consistent with a
benign finding (probable benign fibroadenoma).

Also, there is an oval, circumscribed, parallel hypoechoic mass
located within the right breast at the 5 o'clock position 4 cm from
the nipple measuring 9 x 8 x 4 mm in size. Most likely this
represents a fibroadenoma. However, given the patient's family
history, and following discussion with the patient, tissue sampling
of this mass via ultrasound-guided core biopsy is recommended. This
will be scheduled.

Ultrasound of the right axilla demonstrates normal axillary contents
and no evidence for adenopathy.

Ultrasound of the lateral left breast in the area of the patient's
focal pain demonstrates normal appearing fatty tissue and a normal
appearing benign intramammary lymph node measuring 6 mm in size
located within the left breast at the 3:30 o'clock position 10 cm
from the nipple.
IMPRESSION: 1. 9 mm circumscribed mass located within the right breast at the 5
o'clock position 4 cm from the nipple. Tissue sampling via
ultrasound-guided core biopsy is recommended and will be scheduled.
2. Stable 10 mm circumscribed mass located within the right breast
at the 2:30 o'clock position 3 cm from the nipple. This has now been
stable for 5 years consistent with a benign finding (probable
fibroadenoma).
3. Focal pain within the lateral left breast is most likely on a
hormonal basis. No worrisome findings in this region.

RECOMMENDATION:
Right breast ultrasound-guided core biopsy of the mass located at
the 5 o'clock position 4 cm from the nipple.

I have discussed the findings and recommendations with the patient.
Results were also provided in writing at the conclusion of the
visit. If applicable, a reminder letter will be sent to the patient
regarding the next appointment.

BI-RADS CATEGORY  Category 4A: Low suspicion for malignancy.

## 2016-09-09 IMAGING — US US BREAST*R* LIMITED INC AXILLA
1 series · 12 of 25 positions shown · non-contrast
Comparison: 06/03/2010, 12/06/2007, 12/05/2006.

CLINICAL DATA: Focal tenderness within the lateral left breast.
There is a family history of breast cancer in the patient's mother
at age 60.

EXAM:
2D DIGITAL DIAGNOSTIC BILATERAL MAMMOGRAM WITH CAD AND ADJUNCT TOMO
ULTRASOUND BILATERAL BREAST

[Series 1: us breast*right* limited inc axilla · 0.07mm/px · 12 of 39 slices shown]
[im 2/39]
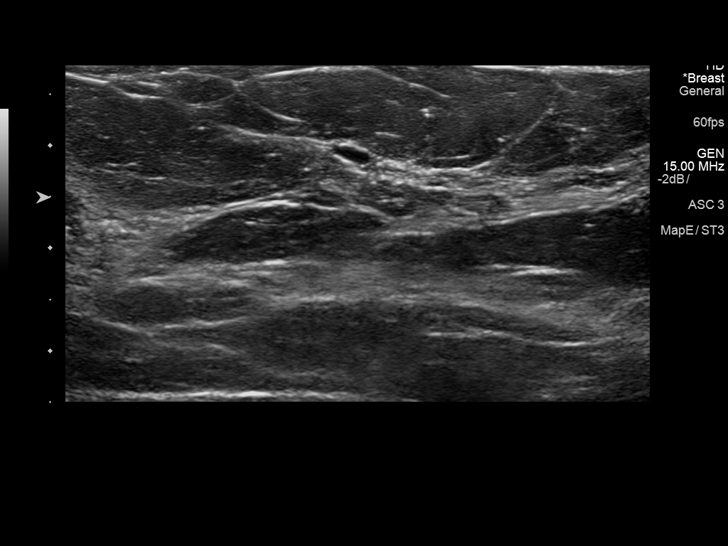
[im 5/39]
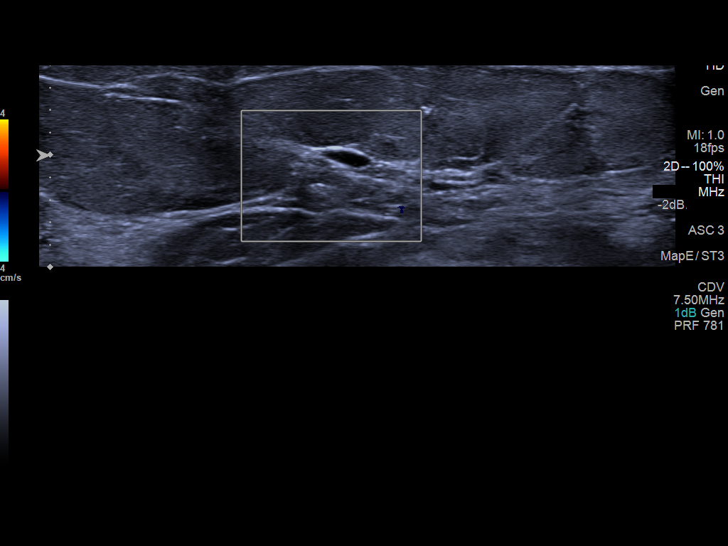
[im 8/39]
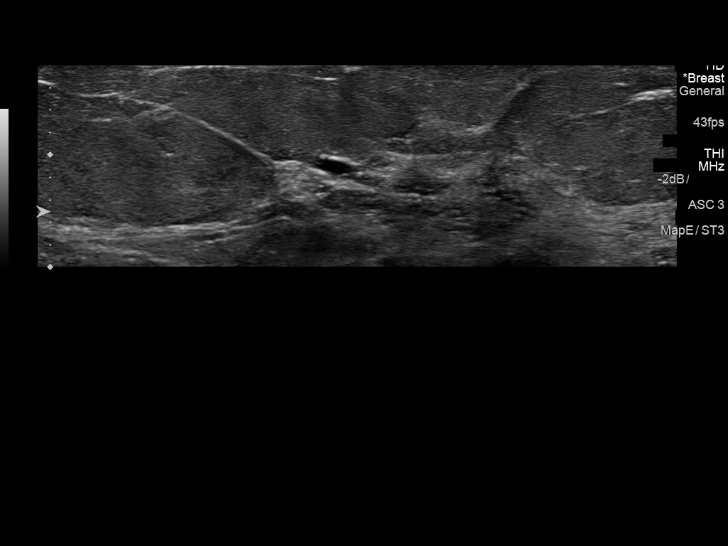
[im 12/39]
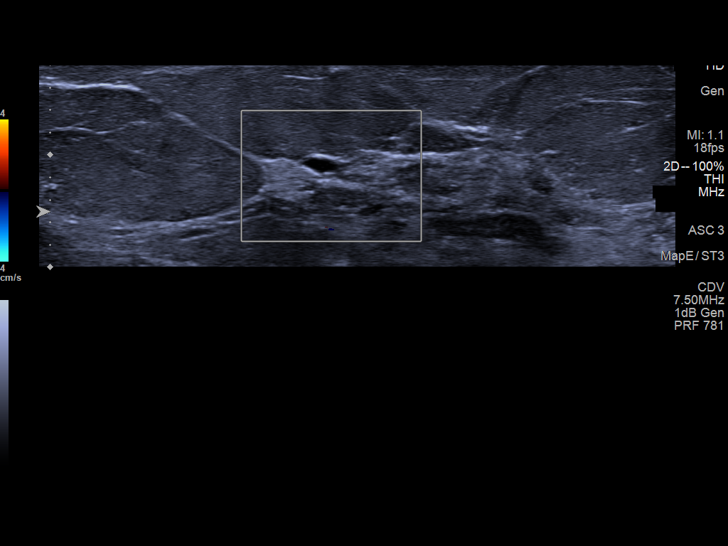
[im 15/39]
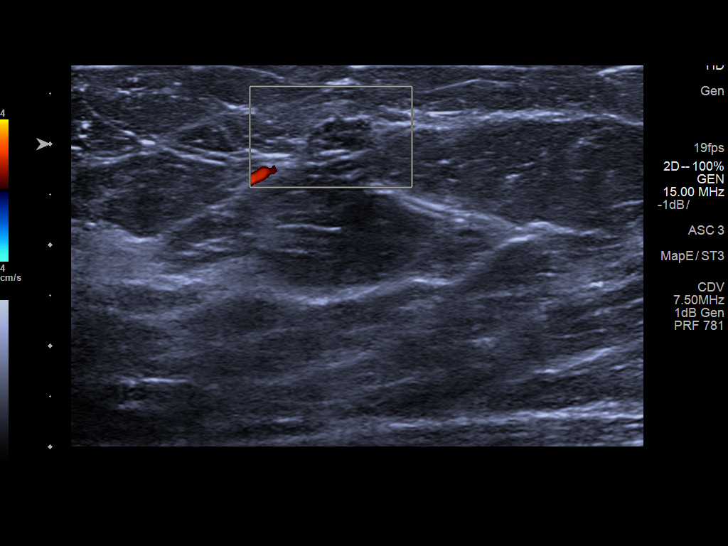
[im 18/39]
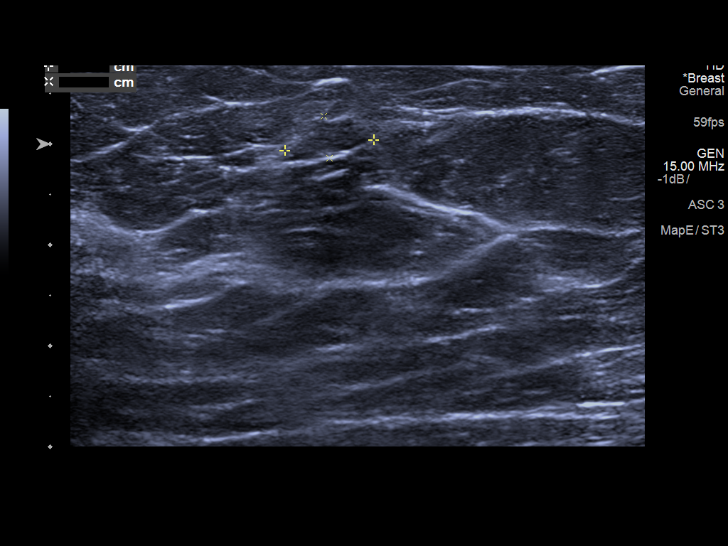
[im 21/39]
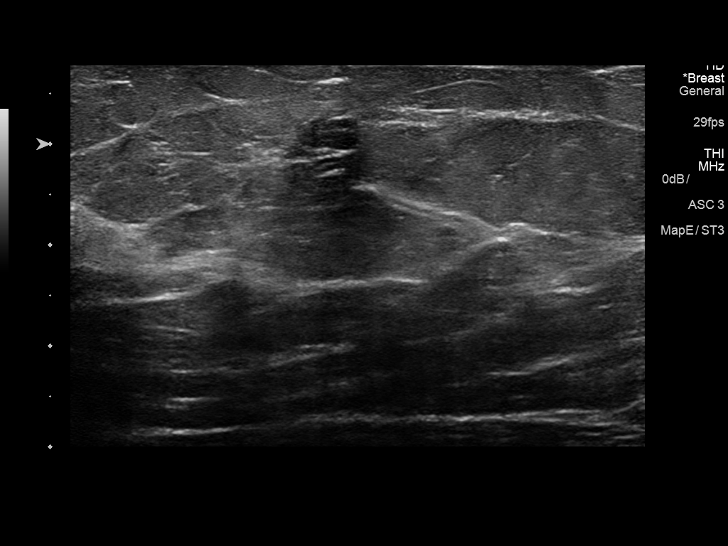
[im 24/39]
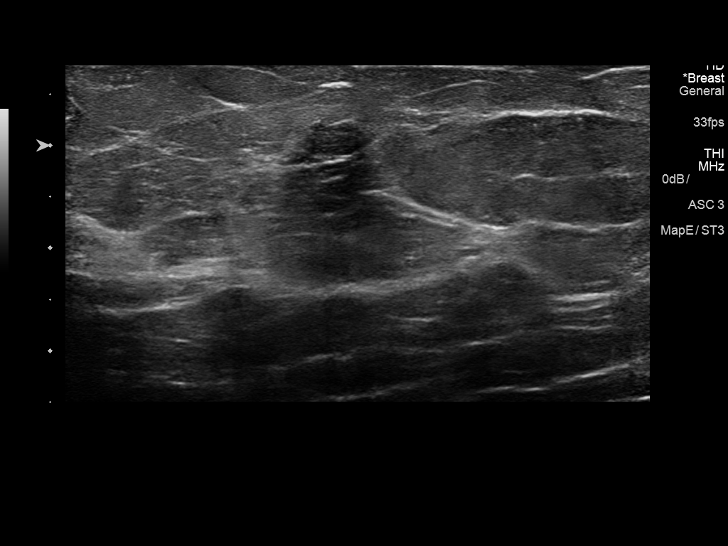
[im 27/39]
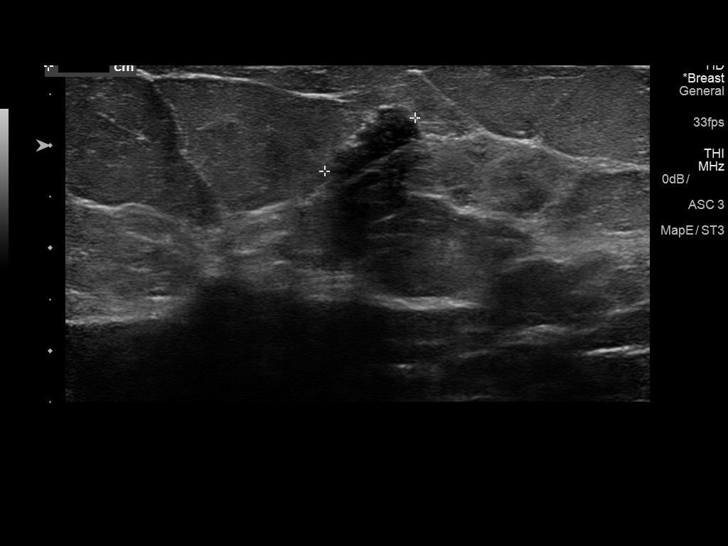
[im 31/39]
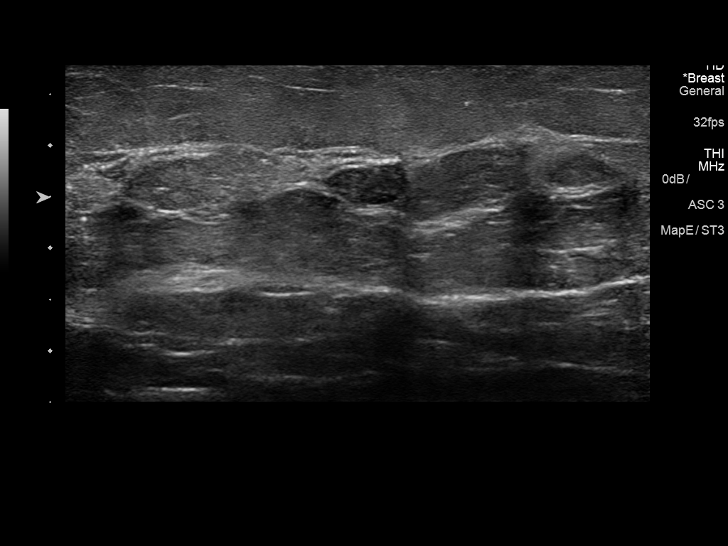
[im 34/39]
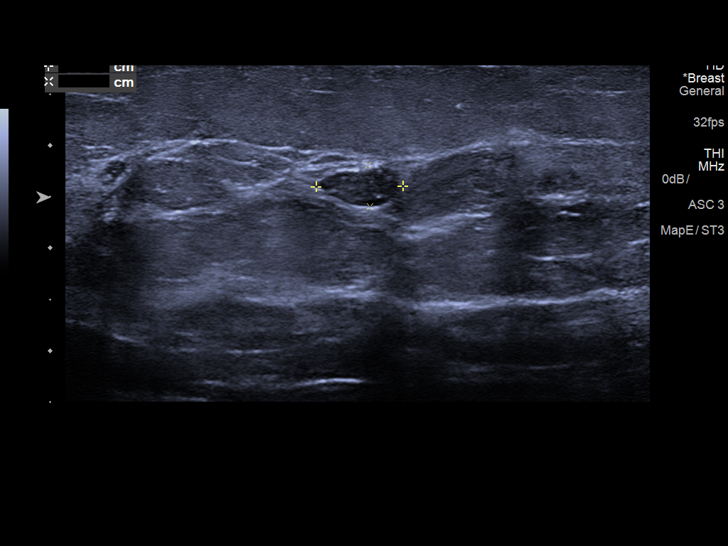
[im 37/39]
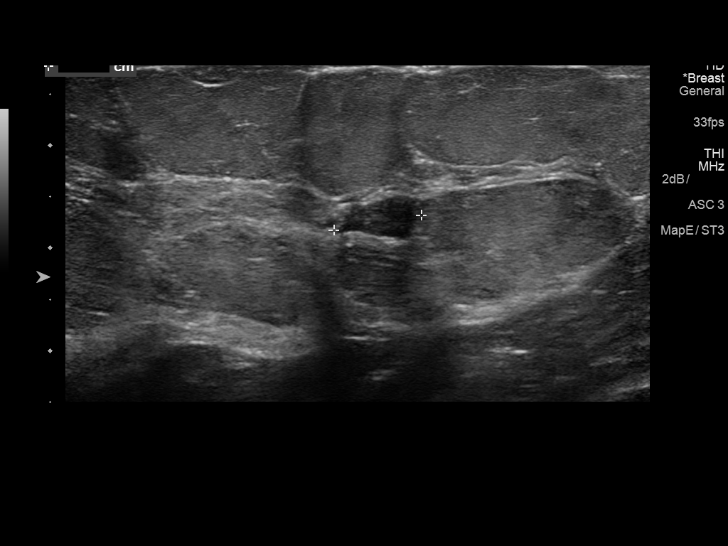

[12 of 25 positions shown; findings below may reference images not displayed]

ACR Breast Density Category b: There are scattered areas of
fibroglandular density.
FINDINGS: There is an oval, circumscribed mass located medially within the
right breast at approximately the 2:30 o'clock position which
appears stable when compared with the prior study. There is a second
oval, circumscribed mass located within the right breast at
approximately the 5 o'clock position. This is not definitely
visualized on the prior study but could be obscured by adjacent
dense breast tissue. There are scattered punctate
microcalcifications present bilaterally particularly laterally
located. These are stable. There are no worrisome linear or
branching forms. In the area of the patient's focal pain located
laterally within the left breast there is fatty tissue. There is no
mass, distortion, or worrisome calcification within either breast.

Mammographic images were processed with CAD.

On physical exam, there is no discrete palpable abnormality within
the lateral left breast in the area the patient's focal pain. Also,
there is no discrete palpable abnormality within the medial right
breast or inferior right breast.

Targeted ultrasound is performed, showing an oval, circumscribed,
parallel hypoechoic mass located within the right breast at the 2:30
o'clock position 3 cm from the nipple measuring 10 x 9 x 4 mm in
size. This is stable mammographically for 5 years consistent with a
benign finding (probable benign fibroadenoma).

Also, there is an oval, circumscribed, parallel hypoechoic mass
located within the right breast at the 5 o'clock position 4 cm from
the nipple measuring 9 x 8 x 4 mm in size. Most likely this
represents a fibroadenoma. However, given the patient's family
history, and following discussion with the patient, tissue sampling
of this mass via ultrasound-guided core biopsy is recommended. This
will be scheduled.

Ultrasound of the right axilla demonstrates normal axillary contents
and no evidence for adenopathy.

Ultrasound of the lateral left breast in the area of the patient's
focal pain demonstrates normal appearing fatty tissue and a normal
appearing benign intramammary lymph node measuring 6 mm in size
located within the left breast at the 3:30 o'clock position 10 cm
from the nipple.
IMPRESSION: 1. 9 mm circumscribed mass located within the right breast at the 5
o'clock position 4 cm from the nipple. Tissue sampling via
ultrasound-guided core biopsy is recommended and will be scheduled.
2. Stable 10 mm circumscribed mass located within the right breast
at the 2:30 o'clock position 3 cm from the nipple. This has now been
stable for 5 years consistent with a benign finding (probable
fibroadenoma).
3. Focal pain within the lateral left breast is most likely on a
hormonal basis. No worrisome findings in this region.

RECOMMENDATION:
Right breast ultrasound-guided core biopsy of the mass located at
the 5 o'clock position 4 cm from the nipple.

I have discussed the findings and recommendations with the patient.
Results were also provided in writing at the conclusion of the
visit. If applicable, a reminder letter will be sent to the patient
regarding the next appointment.

BI-RADS CATEGORY  Category 4A: Low suspicion for malignancy.

## 2016-09-15 ENCOUNTER — Encounter: Payer: Self-pay | Admitting: Family Medicine

## 2016-09-15 ENCOUNTER — Ambulatory Visit (INDEPENDENT_AMBULATORY_CARE_PROVIDER_SITE_OTHER): Payer: 59 | Admitting: Family Medicine

## 2016-09-15 VITALS — BP 122/72 | HR 100 | Temp 98.1°F | Resp 18 | Ht 68.0 in | Wt 246.6 lb

## 2016-09-15 DIAGNOSIS — D473 Essential (hemorrhagic) thrombocythemia: Secondary | ICD-10-CM | POA: Insufficient documentation

## 2016-09-15 DIAGNOSIS — R251 Tremor, unspecified: Secondary | ICD-10-CM

## 2016-09-15 DIAGNOSIS — R29898 Other symptoms and signs involving the musculoskeletal system: Secondary | ICD-10-CM | POA: Diagnosis not present

## 2016-09-15 DIAGNOSIS — D75839 Thrombocytosis, unspecified: Secondary | ICD-10-CM | POA: Insufficient documentation

## 2016-09-15 DIAGNOSIS — E039 Hypothyroidism, unspecified: Secondary | ICD-10-CM

## 2016-09-15 DIAGNOSIS — F339 Major depressive disorder, recurrent, unspecified: Secondary | ICD-10-CM

## 2016-09-15 DIAGNOSIS — M797 Fibromyalgia: Secondary | ICD-10-CM | POA: Diagnosis not present

## 2016-09-15 DIAGNOSIS — R0789 Other chest pain: Secondary | ICD-10-CM | POA: Diagnosis not present

## 2016-09-15 LAB — TSH: TSH: 1.13 mIU/L

## 2016-09-15 MED ORDER — DULOXETINE HCL 60 MG PO CPEP: 60.0000 mg | ORAL_CAPSULE | Freq: Every day | ORAL | 2 refills | Status: DC

## 2016-09-15 MED ORDER — AMPHETAMINE-DEXTROAMPHET ER 15 MG PO CP24: 15.0000 mg | ORAL_CAPSULE | ORAL | 0 refills | Status: DC

## 2016-09-15 MED ORDER — DULOXETINE HCL 60 MG PO CPEP: 120.0000 mg | ORAL_CAPSULE | Freq: Every day | ORAL | 0 refills | Status: DC

## 2016-09-15 NOTE — Progress Notes (Signed)
Name: Debra Soto   MRN: [id number]    DOB: 1960/04/09   Date:09/15/2016       Progress Note  Subjective  Chief Complaint  Chief Complaint  Patient presents with  . Chest Pain  . Back Pain  . Shaking    left wrist comes and goes pt thinks it may be a pinched nerve    HPI  Left hand tremors: started a couple of months ago, left hand only, with movement, she has bilateral hand weakness - drop items frequently, from both hands. No tingling or numbness. Tremors is fine.   Back pain/Chest pain: on left side of chest, described as tightness, sometimes associated with dyspnea, radiates to her upper back and is stabbing on her back. She states she has heartburn but not associated with the symptoms of GERD. Pain sometimes is epigastric. She denies being associated with meals. Symptoms has been going for a long time, last episode more intense, it happened 6 days ago and lasted longer than usual, and lasted more than 5 minutes. She states after the pain she still had some dyspnea, she states she is always tired but is feeling worse than normal. Getting some dizzy spells lately. She has noticed some intermittent right jaw pain intermittently but not present during last episode of chest pain   Major Depression: she is on Duloxetine but states pharmacy also gave her Citalopram.and Wellbutrin plus Adderal, explained that Duloxetine was to replace Citalopram and WEllbutrin and needs to stop those medications  She states that she is doing better emotionally.  She denies suicidal thoughts or ideation   Patient Active Problem List   Diagnosis Date Noted  . Special screening for malignant neoplasms, colon   . Benign neoplasm of descending colon   . Heartburn   . Gastric polyp   . Psoriasis 01/25/2016  . 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 Endometriosis 12/31/2014  . Insomnia, persistent 09/29/2014  . IBS (irritable bowel syndrome) 09/29/2014  . 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 Dyslipidemia  09/29/2014  . Fibromyalgia syndrome 09/29/2014  . Gastro-esophageal reflux disease without esophagitis 09/29/2014  . Herpes labialis 09/29/2014  . Adult hypothyroidism 09/29/2014  . Family history of malignant neoplasm of breast 09/29/2014  . Morbid obesity, unspecified obesity type ([place]) 09/29/2014  . Major depressive episode 09/29/2014  . Obstructive apnea 09/29/2014  . Abnormal presence of protein in urine 09/29/2014  . Allergic rhinitis 09/29/2014  . Vitamin D deficiency 09/29/2014  . Urge incontinence 09/29/2014  . Headache, tension-type 09/29/2014  . Menopausal symptom 09/29/2014    Past Surgical History:  Procedure Laterality Date  . ABDOMINAL HYSTERECTOMY  [id number]  . bladder tact    . CHOLECYSTECTOMY  [id number]  . COLONOSCOPY    . COLONOSCOPY WITH PROPOFOL N/A 02/10/2016   Procedure: COLONOSCOPY WITH PROPOFOL;  Surgeon: Lucilla Lame, MD;  Location: [facility];  Service: Endoscopy;  Laterality: N/A;  . ESOPHAGOGASTRODUODENOSCOPY    . ESOPHAGOGASTRODUODENOSCOPY (EGD) WITH PROPOFOL N/A 02/10/2016   Procedure: ESOPHAGOGASTRODUODENOSCOPY (EGD) WITH PROPOFOL;  Surgeon: Lucilla Lame, MD;  Location: [facility];  Service: Endoscopy;  Laterality: N/A;  sleep apnea  . LIPOMA EXCISION    . POLYPECTOMY  02/10/2016   Procedure: POLYPECTOMY;  Surgeon: Lucilla Lame, MD;  Location: [facility];  Service: Endoscopy;;  . TONSILECTOMY, ADENOIDECTOMY, BILATERAL MYRINGOTOMY AND TUBES      Family History  Problem Relation Age of Onset  . Hypertension Mother   . Arthritis Mother   . Cancer Mother  ovarian,breast, lung, liver  . Breast cancer Mother 4  . Ovarian cancer Mother 22  . Lung cancer Mother 78  . Diabetes Brother   . Hypertension Brother   . Other Father        lung problems  . Ovarian cancer Maternal Grandmother        pt thinks is was ovarian but not sure    Social History   Social History  . Marital status: Married    Spouse name: N/A   . Number of children: N/A  . Years of education: N/A   Occupational History  . Not on file.   Social History Main Topics  . Smoking status: Never Smoker  . Smokeless tobacco: Never Used  . Alcohol use 0.0 oz/week     Comment: rarely  . Drug use: No  . Sexual activity: Yes   Other Topics Concern  . Not on file   Social History Narrative  . No narrative on file     Current Outpatient Prescriptions:  .  amphetamine-dextroamphetamine (ADDERALL XR) 15 MG 24 hr capsule, Take 1 capsule by mouth every morning., Disp: 30 capsule, Rfl: 0 .  amphetamine-dextroamphetamine (ADDERALL XR) 15 MG 24 hr capsule, Take 1 capsule by mouth every morning., Disp: 30 capsule, Rfl: 0 .  amphetamine-dextroamphetamine (ADDERALL XR) 15 MG 24 hr capsule, Take 1 capsule by mouth every morning., Disp: 30 capsule, Rfl: 0 .  betamethasone dipropionate (DIPROLENE) 0.05 % cream, apply A THIN LAYER to affected area twice a day UNTIL CLEAR, Disp: , Rfl: 0 .  Ca Phosphate-Cholecalciferol (CALCIUM/VITAMIN D3 GUMMIES) 200-200 MG-UNIT CHEW, Chew 1 each by mouth daily., Disp: , Rfl:  .  DULoxetine (CYMBALTA) 60 MG capsule, Take 1 capsule (60 mg total) by mouth daily., Disp: 30 capsule, Rfl: 2 .  levothyroxine (SYNTHROID, LEVOTHROID) 200 MCG tablet, TAKE 1 TABLET BY MOUTH DAILY, Disp: 30 tablet, Rfl: 1 .  nystatin-triamcinolone ointment (MYCOLOG), Apply 1 application topically 2 (two) times daily., Disp: 30 g, Rfl: 0 .  omeprazole (PRILOSEC) 20 MG capsule, take 1 capsule by mouth twice a day BEFORE A MEAL, Disp: 60 capsule, Rfl: 5 .  valACYclovir (VALTREX) 1000 MG tablet, Take 1 tablet (1,000 mg total) by mouth 2 (two) times daily as needed., Disp: 30 tablet, Rfl: 0  No Known Allergies   ROS  Constitutional: Negative for fever or weight change.  Respiratory: Negative for cough and shortness of breath.   Cardiovascular: Negative for chest pain or palpitations.  Gastrointestinal: Negative for abdominal pain, no bowel  changes.  Musculoskeletal: Negative for gait problem or joint swelling.  Skin: Negative for rash.  Neurological: Negative for dizziness or headache.  No other specific complaints in a complete review of systems (except as listed in HPI above).  Objective  Vitals:   09/15/16 1026  BP: 122/72  Pulse: (!) 118  Resp: 18  Temp: 98.1 F (36.7 C)  SpO2: 97%  Weight: 246 lb 9 oz (111.8 kg)  Height: '5\' 8"'  (1.727 m)    Body mass index is 37.49 kg/m.  Physical Exam  Constitutional: Patient appears well-developed and well-nourished. Obese  No distress.  HEENT: head atraumatic, normocephalic, pupils equal and reactive to light, neck supple, throat within normal limits Cardiovascular: Normal rate during exam, regular rhythm and normal heart sounds.  No murmur heard. No BLE edema. Pulmonary/Chest: Effort normal and breath sounds normal. No respiratory distress. Abdominal: Soft.  There is no tenderness. Psychiatric: Patient has a normal mood and affect. behavior is  normal. Judgment and thought content normal.  Recent Results (from the past 2160 hour(s))  TSH     Status: None   Collection Time: 06/29/16 10:32 AM  Result Value Ref Range   TSH 2.68 mIU/L    Comment:   Reference Range   > or = 20 Years  0.40-4.50   Pregnancy Range First trimester  0.26-2.66 Second trimester 0.55-2.73 Third trimester  0.43-2.91     CBC with Differential/Platelet     Status: Abnormal   Collection Time: 06/29/16 11:01 AM  Result Value Ref Range   WBC 6.2 3.8 - 10.8 K/uL   RBC 4.62 3.80 - 5.10 MIL/uL   Hemoglobin 10.8 (L) 11.7 - 15.5 g/dL   HCT 35.2 35.0 - 45.0 %   MCV 76.2 (L) 80.0 - 100.0 fL   MCH 23.4 (L) 27.0 - 33.0 pg   MCHC 30.7 (L) 32.0 - 36.0 g/dL   RDW 16.4 (H) 11.0 - 15.0 %   Platelets 415 (H) 140 - 400 K/uL   MPV 9.6 7.5 - 12.5 fL   Neutro Abs 3,410 1,500 - 7,800 cells/uL   Lymphs Abs 2,294 850 - 3,900 cells/uL   Monocytes Absolute 372 200 - 950 cells/uL   Eosinophils Absolute 62 15  - 500 cells/uL   Basophils Absolute 62 0 - 200 cells/uL   Neutrophils Relative % 55 %   Lymphocytes Relative 37 %   Monocytes Relative 6 %   Eosinophils Relative 1 %   Basophils Relative 1 %   Smear Review Criteria for review not met   COMPLETE METABOLIC PANEL WITH GFR     Status: None   Collection Time: 06/29/16 11:01 AM  Result Value Ref Range   Sodium 140 135 - 146 mmol/L   Potassium 4.3 3.5 - 5.3 mmol/L   Chloride 107 98 - 110 mmol/L   CO2 24 20 - 31 mmol/L   Glucose, Bld 88 65 - 99 mg/dL   BUN 16 7 - 25 mg/dL   Creat 0.94 0.50 - 1.05 mg/dL    Comment:   For patients > or = 56 years of age: The upper reference limit for Creatinine is approximately 13% higher for people identified as African-American.      Total Bilirubin 0.3 0.2 - 1.2 mg/dL   Alkaline Phosphatase 117 33 - 130 U/L   AST 15 10 - 35 U/L   ALT 18 6 - 29 U/L   Total Protein 7.1 6.1 - 8.1 g/dL   Albumin 3.8 3.6 - 5.1 g/dL   Calcium 9.1 8.6 - 10.4 mg/dL   GFR, Est African American 78 >=60 mL/min   GFR, Est Non African American 68 >=60 mL/min  Hemoglobin A1c     Status: None   Collection Time: 06/29/16 11:01 AM  Result Value Ref Range   Hgb A1c MFr Bld 5.1 <5.7 %    Comment:   For the purpose of screening for the presence of diabetes:   <5.7%       Consistent with the absence of diabetes 5.7-6.4 %   Consistent with increased risk for diabetes (prediabetes) >=6.5 %     Consistent with diabetes   This assay result is consistent with a decreased risk of diabetes.   Currently, no consensus exists regarding use of hemoglobin A1c for diagnosis of diabetes in children.   According to American Diabetes Association (ADA) guidelines, hemoglobin A1c <7.0% represents optimal control in non-pregnant diabetic patients. Different metrics may apply to specific patient populations. Standards  of Medical Care in Diabetes (ADA).      Mean Plasma Glucose 100 mg/dL  Insulin, fasting     Status: None   Collection Time:  06/29/16 11:01 AM  Result Value Ref Range   Insulin fasting, serum 12.5 2.0 - 19.6 uIU/mL    Comment:   This insulin assay shows strong cross-reactivity for some insulin analogs (lispro, aspart, and glargine) and much lower cross-reactivity with others (detemir, glulisine).   Stimulated Insulin reference intervals were established using the Siemens Immulite assay. These values are provided for general guidance only.   Lipid panel     Status: Abnormal   Collection Time: 06/29/16 11:01 AM  Result Value Ref Range   Cholesterol 221 (H) <200 mg/dL   Triglycerides 141 <150 mg/dL   HDL 53 >50 mg/dL   Total CHOL/HDL Ratio 4.2 <5.0 Ratio   VLDL 28 <30 mg/dL   LDL Cholesterol 140 (H) <100 mg/dL  VITAMIN D 25 Hydroxy (Vit-D Deficiency, Fractures)     Status: Abnormal   Collection Time: 06/29/16 11:01 AM  Result Value Ref Range   Vit D, 25-Hydroxy 29 (L) 30 - 100 ng/mL    Comment: Vitamin D Status           25-OH Vitamin D        Deficiency                <20 ng/mL        Insufficiency         20 - 29 ng/mL        Optimal             > or = 30 ng/mL   For 25-OH Vitamin D testing on patients on D2-supplementation and patients for whom quantitation of D2 and D3 fractions is required, the QuestAssureD 25-OH VIT D, (D2,D3), LC/MS/MS is recommended: order code [id number] (patients > 2 yrs).   Vitamin B12     Status: None   Collection Time: 06/29/16 11:01 AM  Result Value Ref Range   Vitamin B-12 366 200 - 1,100 pg/mL      PHQ2/9: Depression screen [facility] 2/9 06/29/2016 01/25/2016 12/20/2015 12/15/2015 10/22/2015  Decreased Interest 2 0 0 0 0  Down, Depressed, Hopeless - 0 0 0 0  PHQ - 2 Score 2 0 0 0 0  Altered sleeping 3 - - - -  Change in appetite (No Data) - - - -  Feeling bad or failure about yourself  0 - - - -  Trouble concentrating 0 - - - -  Moving slowly or fidgety/restless 0 - - - -  Suicidal thoughts 0 - - - -  PHQ-9 Score 5 - - - -  Difficult doing work/chores Not difficult at  all - - - -     Fall Risk: Fall Risk  06/29/2016 01/25/2016 12/20/2015 12/15/2015 10/22/2015  Falls in the past year? No No No No No    Assessment & Plan  1. Major depression, recurrent, chronic ([place])  Hold adderal until chest pain has resolved Needs to stop Citalopram and Wellbutrin and we will increase dose of Cymbalta - DULoxetine (CYMBALTA) 60 MG capsule; Take 2 capsule (60 mg total) by mouth daily.  Dispense: 60 capsule; Refill: 0 - amphetamine-dextroamphetamine (ADDERALL XR) 15 MG 24 hr capsule; Take 1 capsule by mouth every morning.  Dispense: 30 capsule; Refill: 0 - amphetamine-dextroamphetamine (ADDERALL XR) 15 MG 24 hr capsule; Take 1 capsule by mouth every morning.  Dispense: 30 capsule; Refill:  0 - amphetamine-dextroamphetamine (ADDERALL XR) 15 MG 24 hr capsule; Take 1 capsule by mouth every morning.  Dispense: 30 capsule; Refill: 0   2. Adult hypothyroidism  - TSH  3. Fibromyalgia syndrome  - DULoxetine (CYMBALTA) 60 MG capsule; Take 2 capsules (120 mg total) by mouth daily. In place of Citalpram and WEllbutrin, and it is higher dose not one daily but 2  Dispense: 60 capsule; Refill: 0  4. Tremor of left hand  - Ambulatory referral to Neurology  5. Weakness of both arms  - Ambulatory referral to Neurology  6. Other chest pain  It may be secondary to hyperthyroidism, and we will check TSH, she was not compliant with synthroid but has been over the past couple of months, recheck level. Go to [facility] if longer than 10 minutes, associated with nausea, vomiting or diaphoresis or if recurrent throughout the day - Electrocardiogram report - Ambulatory referral to Cardiology

## 2016-09-16 ENCOUNTER — Encounter: Payer: Self-pay | Admitting: Family Medicine

## 2016-09-18 ENCOUNTER — Telehealth: Payer: Self-pay

## 2016-09-18 NOTE — Telephone Encounter (Signed)
Per new patient referral scheduled with Sharolyn Douglas on 8/9 and added to waitlist when making appt patient c/o chest pain radiating back and jaw pain patient has no hx with cardiology

## 2016-09-18 NOTE — Telephone Encounter (Signed)
Spoke with patient and made her aware of appointment date and time. She verbalized understanding with no further questions at this time.

## 2016-09-18 NOTE — Telephone Encounter (Signed)
Patient calling in for new patient referral and reported some discomfort. She states that it is not all the time but has noted that she is having more frequently now. She reports that the last one was on Thursday was the strongest she ever had and even had some trouble breathing. Reviewed with her that if she has another episode of that to seek evaluation in the emergency room. She states that she has 2 to 3 episodes a month. Let her know that we added her on our waiting list if there is a cancellation or opening. She verbalized understanding of our conversation and had no further questions at this time.

## 2016-09-18 NOTE — Telephone Encounter (Signed)
Left detailed voicemail message that we have her scheduled to come in on Thursday at 09:00AM

## 2016-09-20 NOTE — Progress Notes (Addendum)
Cardiology Office Note  Date:  09/21/2016   ID:  Debra Soto, DOB [birth year], MRN [id number]  PCP:  Steele Sizer, MD   Chief Complaint  Patient presents with  . other    New patient. Referred by pcp for Chest pain radiating in back jaw and rapid heart rate. Meds reviewed verbally with patient.     HPI:  Debra Soto is a 31 short woman with history of Obesity Hypothyroidism Fibromyalgia GERD Insomnia Who presents by referral from Dr. Ancil Boozer for consultation of her chest pain   She reports having recent episodes of chest pain radiating through to her back, also radiating to her jaw  1 recent episode lasted 10 to 30 min before it resolved on its own without intervention At the time she was sitting at her computer when she developed symptoms  Also reported having some shortness of breath Since her chest pain episode feels her breathing has been more difficult  Also feels exhausted, like she cannot get enough sleep Recently went on a cruise, was climbing stairs had shortness of breath and excessive which she would normally appreciate  No regular exercise program  Ports having history of GERD Occasional Pain sometimes in epigastric area.   denies being associated with meals.    Getting some dizzy spells lately.   EKG personally reviewed by myself on todays visit Shows normal sinus rhythm with rate 86 bpm no significant ST or T-wave changes    Mom and dad were smokers, no premature coronary disease Mother died of lung cancer    PMH:   has a past medical history of Allergy; Arthritis; Depression; Erythrodermic psoriasis; Fibromyalgia; GERD (gastroesophageal reflux disease); Hyperlipemia; Insomnia; Obesity; Recurrent HSV (herpes simplex virus); Sleep apnea; Thyroid disease; and Vitamin D deficiency.  PSH:    Past Surgical History:  Procedure Laterality Date  . ABDOMINAL HYSTERECTOMY  [id number]  . bladder tact    . CHOLECYSTECTOMY  [id number]  . COLONOSCOPY    .  COLONOSCOPY WITH PROPOFOL N/A 02/10/2016   Procedure: COLONOSCOPY WITH PROPOFOL;  Surgeon: Lucilla Lame, MD;  Location: [place];  Service: Endoscopy;  Laterality: N/A;  . ESOPHAGOGASTRODUODENOSCOPY    . ESOPHAGOGASTRODUODENOSCOPY (EGD) WITH PROPOFOL N/A 02/10/2016   Procedure: ESOPHAGOGASTRODUODENOSCOPY (EGD) WITH PROPOFOL;  Surgeon: Lucilla Lame, MD;  Location: [facility];  Service: Endoscopy;  Laterality: N/A;  sleep apnea  . LIPOMA EXCISION    . POLYPECTOMY  02/10/2016   Procedure: POLYPECTOMY;  Surgeon: Lucilla Lame, MD;  Location: [facility];  Service: Endoscopy;;  . TONSILECTOMY, ADENOIDECTOMY, BILATERAL MYRINGOTOMY AND TUBES      Current Outpatient Prescriptions  Medication Sig Dispense Refill  . betamethasone dipropionate (DIPROLENE) 0.05 % cream apply A THIN LAYER to affected area twice a day UNTIL CLEAR  0  . Ca Phosphate-Cholecalciferol (CALCIUM/VITAMIN D3 GUMMIES) 200-200 MG-UNIT CHEW Chew 1 each by mouth daily.    . DULoxetine (CYMBALTA) 60 MG capsule Take 2 capsules (120 mg total) by mouth daily. In place of Citalpram and WEllbutrin, and it is higher dose not one daily but 2 60 capsule 0  . levothyroxine (SYNTHROID, LEVOTHROID) 200 MCG tablet TAKE 1 TABLET BY MOUTH DAILY 30 tablet 1  . nystatin-triamcinolone ointment (MYCOLOG) Apply 1 application topically 2 (two) times daily. 30 g 0  . omeprazole (PRILOSEC) 20 MG capsule take 1 capsule by mouth twice a day BEFORE A MEAL 60 capsule 5  . valACYclovir (VALTREX) 1000 MG tablet Take 1 tablet (1,000 mg total) by mouth 2 (two)  times daily as needed. 30 tablet 0  . amphetamine-dextroamphetamine (ADDERALL XR) 15 MG 24 hr capsule Take 1 capsule by mouth every morning. (Patient not taking: Reported on 09/21/2016) 30 capsule 0   No current facility-administered medications for this visit.      Allergies:   Patient has no known allergies.   Social History:  The patient  reports that she has never smoked. She  has never used smokeless tobacco. She reports that she drinks alcohol. She reports that she does not use drugs.   Family History:   family history includes Arthritis in her mother; Breast cancer (age of onset: 48) in her mother; Cancer in her mother; Diabetes in her brother; Hypertension in her brother and mother; Lung cancer (age of onset: 6) in her mother; Other in her father; Ovarian cancer in her maternal grandmother; Ovarian cancer (age of onset: 21) in her mother.    Review of Systems: Review of Systems  Constitutional: Positive for malaise/fatigue.  Respiratory: Positive for shortness of breath.   Cardiovascular: Positive for chest pain.       With associated jaw pain, chest pain radiating through to the back  Gastrointestinal: Negative.   Musculoskeletal: Negative.   Neurological: Positive for dizziness.  Psychiatric/Behavioral: Negative.   All other systems reviewed and are negative.    PHYSICAL EXAM: VS:  BP 128/76 (BP Location: Right Arm, Patient Position: Sitting, Cuff Size: Normal)   Pulse 86   Ht 5\' 8"  (1.727 m)   Wt 249 lb 4 oz (113.1 kg)   BMI 37.90 kg/m  , BMI Body mass index is 37.9 kg/m. GEN: Well nourished, well developed, in no acute distress  HEENT: normal  Neck: no JVD, carotid bruits, or masses Cardiac: RRR; no murmurs, rubs, or gallops,no edema  Respiratory:  clear to auscultation bilaterally, normal work of breathing GI: soft, nontender, nondistended, + BS MS: no deformity or atrophy  Skin: warm and dry, no rash Neuro:  Strength and sensation are intact Psych: euthymic mood, full affect    Recent Labs: 06/29/2016: ALT 18; BUN 16; Creat 0.94; Hemoglobin 10.8; Platelets 415; Potassium 4.3; Sodium 140 09/15/2016: TSH 1.13    Lipid Panel Lab Results  Component Value Date   CHOL 221 (H) 06/29/2016   HDL 53 06/29/2016   LDLCALC 140 (H) 06/29/2016   TRIG 141 06/29/2016      Wt Readings from Last 3 Encounters:  09/21/16 249 lb 4 oz (113.1 kg)   09/15/16 246 lb 9 oz (111.8 kg)  06/29/16 251 lb 8 oz (114.1 kg)       ASSESSMENT AND PLAN:  Chest pain, unspecified type - Plan: EKG 12-Lead, ECHOCARDIOGRAM STRESS TEST Long discussion with her concerning her symptoms Chest pain radiating through to the back, jaw pain though presenting at rest She does have risk factors including hyperlipidemia After long discussion of various treatment options, she reports that she is concerned and would like definitive evaluation We did offer routine stress testing such as treadmill, stress echo, coronary calcium scoring She will schedule stress echo for risk stratification Unable to perform this on today's visit, we will schedule at a later date at her convenience and call her with the results  Insomnia, persistent Possibly contributing to her fatigue and lack of energy Obstructive sleep apnea  Dyslipidemia Discussed weight loss, dietary changes in effort to improve her cholesterol We did also talk was CT coronary calcium scoring For elevated score, could consider more aggressive lipid management  Morbid obesity, unspecified obesity  type [facility]) Based on her stress test results, would recommend regular exercise program and low carbohydrate diet, weight loss  Obstructive sleep apnea Previously diagnosed, managed by primary care Unable to tolerate CPAP, poor sleep hygiene Previous notes from primary care documenting sometimes only 4 hours of sleep per night  Shortness of breath Likely multifactorial including morbid obesity, obstructive sleep apnea  Disposition:   F/U  as needed We will call her with the results of her stress test  Patient seen in consultation for Dr. Ancil Boozer and will be referred back to her office for ongoing care of the issues above   Total encounter time more than 60 minutes  Greater than 50% was spent in counseling and coordination of care with the patient    Orders Placed This Encounter  Procedures  . EKG  12-Lead  . ECHOCARDIOGRAM STRESS TEST     Signed, Esmond Plants, M.D., Ph.D. 09/21/2016  [place], [place]

## 2016-09-21 ENCOUNTER — Ambulatory Visit (INDEPENDENT_AMBULATORY_CARE_PROVIDER_SITE_OTHER): Payer: 59 | Admitting: Cardiovascular Disease

## 2016-09-21 ENCOUNTER — Encounter: Payer: Self-pay | Admitting: Cardiovascular Disease

## 2016-09-21 VITALS — BP 128/76 | HR 86 | Ht 68.0 in | Wt 249.2 lb

## 2016-09-21 DIAGNOSIS — G4700 Insomnia, unspecified: Secondary | ICD-10-CM

## 2016-09-21 DIAGNOSIS — E785 Hyperlipidemia, unspecified: Secondary | ICD-10-CM | POA: Diagnosis not present

## 2016-09-21 DIAGNOSIS — R079 Chest pain, unspecified: Secondary | ICD-10-CM

## 2016-09-21 NOTE — Patient Instructions (Addendum)
Medication Instructions:   No medication changes made  Labwork:  No new labs needed  Testing/Procedures:  We will order a stress echo for chest pain  Follow-Up: It was a pleasure seeing you in the office today. Please call us if you have new issues that need to be addressed before your next appt.  [phone]  Your physician wants you to follow-up in: As needed  If you need a refill on your cardiac medications before your next appointment, please call your pharmacy.   Exercise Stress Echocardiogram An exercise stress echocardiogram is a test that checks how well your heart is working. For this test, you will walk on a treadmill to make your heart beat faster. This test uses sound waves (ultrasound) and a computer to make pictures (images) of your heart. These pictures will be taken before you exercise and after you exercise. What happens before the procedure?  Follow instructions from your doctor about what you cannot eat or drink before the test.  Do not drink or eat anything that has caffeine in it. Stop having caffeine for 24 hours before the test.  Ask your doctor about changing or stopping your normal medicines. This is important if you take diabetes medicines or blood thinners. Ask your doctor if you should take your medicines with water before the test.  If you use an inhaler, bring it to the test.  Do not use any products that have nicotine or tobacco in them, such as cigarettes and e-cigarettes. Stop using them for 4 hours before the test. If you need help quitting, ask your doctor.  Wear comfortable shoes and clothing. What happens during the procedure?  You will be hooked up to a TV screen. Your doctor will watch the screen to see how fast your heart beats during the test.  Before you exercise, a computer will make a picture of your heart. To do this: ? A gel will be put on your chest. ? A wand will be moved over the gel. ? Sound waves from the wand will go to  the computer to make the picture.  Your will start walking on a treadmill. The treadmill will start at a slow speed. It will get faster a little bit at a time. When you walk faster, your heart will beat faster.  The treadmill will be stopped when your heart is working hard.  You will lie down right away so another picture of your heart can be taken.  The test will take 30-60 minutes. What happens after the procedure?  Your heart rate and blood pressure will be watched after the test.  If your doctor says that you can, you may: ? Eat what you usually eat. ? Do your normal activities. ? Take medicines like normal. Summary  An exercise stress echocardiogram is a test that checks how well your heart is working.  Follow instructions about what you cannot eat or drink before the test. Ask your doctor if you should take your normal medicines before the test.  Stop having caffeine for 24 hours before the test. Do not use anything with nicotine or tobacco in it for 4 hours before the test.  A computer will take a picture of your heart before you walk on a treadmill. It will take another picture when you are done walking.  Your heart rate and blood pressure will be watched after the test. This information is not intended to replace advice given to you by your health care provider. Make sure  you discuss any questions you have with your health care provider. Document Released: 12/25/2008 Document Revised: 11/21/2015 Document Reviewed: 11/21/2015 Elsevier Interactive Patient Education  2017 [employer].

## 2016-09-28 ENCOUNTER — Ambulatory Visit: Payer: 59 | Admitting: Family Medicine

## 2016-10-01 ENCOUNTER — Other Ambulatory Visit: Payer: Self-pay | Admitting: Family Medicine

## 2016-10-01 DIAGNOSIS — E038 Other specified hypothyroidism: Secondary | ICD-10-CM

## 2016-10-16 ENCOUNTER — Encounter: Payer: Self-pay | Admitting: Family Medicine

## 2016-10-16 ENCOUNTER — Telehealth: Payer: Self-pay | Admitting: Cardiovascular Disease

## 2016-10-16 ENCOUNTER — Ambulatory Visit (INDEPENDENT_AMBULATORY_CARE_PROVIDER_SITE_OTHER): Payer: 59 | Admitting: Family Medicine

## 2016-10-16 VITALS — BP 120/80 | HR 95 | Temp 98.0°F | Resp 18 | Ht 68.0 in | Wt 246.1 lb

## 2016-10-16 DIAGNOSIS — E785 Hyperlipidemia, unspecified: Secondary | ICD-10-CM | POA: Diagnosis not present

## 2016-10-16 DIAGNOSIS — M797 Fibromyalgia: Secondary | ICD-10-CM

## 2016-10-16 DIAGNOSIS — M542 Cervicalgia: Secondary | ICD-10-CM

## 2016-10-16 DIAGNOSIS — E039 Hypothyroidism, unspecified: Secondary | ICD-10-CM

## 2016-10-16 DIAGNOSIS — F339 Major depressive disorder, recurrent, unspecified: Secondary | ICD-10-CM

## 2016-10-16 DIAGNOSIS — E038 Other specified hypothyroidism: Secondary | ICD-10-CM

## 2016-10-16 DIAGNOSIS — K219 Gastro-esophageal reflux disease without esophagitis: Secondary | ICD-10-CM

## 2016-10-16 DIAGNOSIS — G4709 Other insomnia: Secondary | ICD-10-CM

## 2016-10-16 DIAGNOSIS — B356 Tinea cruris: Secondary | ICD-10-CM

## 2016-10-16 MED ORDER — LEVOTHYROXINE SODIUM 200 MCG PO TABS: 200.0000 ug | ORAL_TABLET | Freq: Every day | ORAL | 2 refills | Status: DC

## 2016-10-16 MED ORDER — HYDROXYZINE HCL 10 MG PO TABS: 10.0000 mg | ORAL_TABLET | Freq: Every day | ORAL | 0 refills | Status: DC

## 2016-10-16 MED ORDER — CITALOPRAM HYDROBROMIDE 20 MG PO TABS: 20.0000 mg | ORAL_TABLET | Freq: Every day | ORAL | 2 refills | Status: DC

## 2016-10-16 MED ORDER — BUPROPION HCL ER (XL) 150 MG PO TB24: 150.0000 mg | ORAL_TABLET | Freq: Every day | ORAL | 2 refills | Status: DC

## 2016-10-16 NOTE — Progress Notes (Addendum)
Name: Debra Soto   MRN: [id number]    DOB: [birth year]   Date:10/16/2016       Progress Note  Subjective  Chief Complaint  Chief Complaint  Patient presents with  . Depression    1 month follow up pt stated that she would like to go back to previous medications cymbalta and wellbutrin                                             . bruise    pt has what she describes as a bruise on her upper rt leg that she has some concern about    HPI  Major Depression: she is on Duloxetine and off Citalopram and Wellbutrin, she states she is under more stress, and would like to stop Cymbalta and go back on previous medication. Explained that we switched because it was not working, but patient insisted, does not want to try another medication at this time, also refuses seeing psychiatrist.   She  is irritable, unable to sleep, tired all the time, FMS is worse.  She denies suicidal thoughts or ideation  FMS: feels like she is has a flare, triggered by lack of sleep, more body aches and mental fogginess. On cymbalta but not helping. She does not want another medication.   Insomnia: took Trazodone but does not want to go back at this time. We will try Hydroxyzine as off label indication for sleep   Dyslipidemia: LDL is 140, seen by Dr. Rockey Situ, and depending on stress test, the plan will be to start statin therapy. Chest pain has resolved, she has  palpitation.   GERD: on Omeprazol and stable, no heartburn or indigestion with medication  Neck pain: she states when tense neck gets worse, stiff and sore, affecting her sleep   Patient Active Problem List   Diagnosis Date Noted  . Special screening for malignant neoplasms, colon   . Benign neoplasm of descending colon   . Heartburn   . Gastric polyp   . Psoriasis 01/25/2016  . 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 Endometriosis 12/31/2014  . Insomnia, persistent 09/29/2014  . IBS (irritable bowel syndrome) 09/29/2014  . Dermatographic urticaria  09/29/2014  . Dyslipidemia 09/29/2014  . Fibromyalgia syndrome 09/29/2014  . Gastro-esophageal reflux disease without esophagitis 09/29/2014  . Herpes labialis 09/29/2014  . Adult hypothyroidism 09/29/2014  . Family history of malignant neoplasm of breast 09/29/2014  . Morbid obesity, unspecified obesity type ([place]) 09/29/2014  . Major depressive episode 09/29/2014  . Obstructive apnea 09/29/2014  . Abnormal presence of protein in urine 09/29/2014  . Allergic rhinitis 09/29/2014  . Vitamin D deficiency 09/29/2014  . Urge incontinence 09/29/2014  . Headache, tension-type 09/29/2014  . Menopausal symptom 09/29/2014    Past Surgical History:  Procedure Laterality Date  . ABDOMINAL HYSTERECTOMY  [id number]  . bladder tact    . CHOLECYSTECTOMY  [id number]  . COLONOSCOPY    . COLONOSCOPY WITH PROPOFOL N/A 02/10/2016   Procedure: COLONOSCOPY WITH PROPOFOL;  Surgeon: Lucilla Lame, MD;  Location: [place];  Service: Endoscopy;  Laterality: N/A;  . ESOPHAGOGASTRODUODENOSCOPY    . ESOPHAGOGASTRODUODENOSCOPY (EGD) WITH PROPOFOL N/A 02/10/2016   Procedure: ESOPHAGOGASTRODUODENOSCOPY (EGD) WITH PROPOFOL;  Surgeon: Lucilla Lame, MD;  Location: [place];  Service: Endoscopy;  Laterality: N/A;  sleep apnea  . LIPOMA EXCISION    . POLYPECTOMY  02/10/2016   Procedure: POLYPECTOMY;  Surgeon: Lucilla Lame, MD;  Location: [place];  Service: Endoscopy;;  . TONSILECTOMY, ADENOIDECTOMY, BILATERAL MYRINGOTOMY AND TUBES      Family History  Problem Relation Age of Onset  . Hypertension Mother   . Arthritis Mother   . Cancer Mother        ovarian,breast, lung, liver  . Breast cancer Mother 77  . Ovarian cancer Mother 65  . Lung cancer Mother 56  . Diabetes Brother   . Hypertension Brother   . Other Father        lung problems  . Ovarian cancer Maternal Grandmother        pt thinks is was ovarian but not sure    Social History   Social History  . Marital status:  Married    Spouse name: N/A  . Number of children: N/A  . Years of education: N/A   Occupational History  . Not on file.   Social History Main Topics  . Smoking status: Never Smoker  . Smokeless tobacco: Never Used  . Alcohol use 0.0 oz/week     Comment: rarely  . Drug use: No  . Sexual activity: Yes   Other Topics Concern  . Not on file   Social History Narrative  . No narrative on file     Current Outpatient Prescriptions:  .  amphetamine-dextroamphetamine (ADDERALL XR) 15 MG 24 hr capsule, Take 1 capsule by mouth every morning., Disp: 30 capsule, Rfl: 0 .  betamethasone dipropionate (DIPROLENE) 0.05 % cream, apply A THIN LAYER to affected area twice a day UNTIL CLEAR, Disp: , Rfl: 0 .  Ca Phosphate-Cholecalciferol (CALCIUM/VITAMIN D3 GUMMIES) 200-200 MG-UNIT CHEW, Chew 1 each by mouth daily., Disp: , Rfl:  .  levothyroxine (SYNTHROID, LEVOTHROID) 200 MCG tablet, Take 1 tablet (200 mcg total) by mouth daily., Disp: 30 tablet, Rfl: 2 .  nystatin-triamcinolone ointment (MYCOLOG), Apply 1 application topically 2 (two) times daily., Disp: 30 g, Rfl: 0 .  omeprazole (PRILOSEC) 20 MG capsule, take 1 capsule by mouth twice a day BEFORE A MEAL, Disp: 60 capsule, Rfl: 5 .  valACYclovir (VALTREX) 1000 MG tablet, Take 1 tablet (1,000 mg total) by mouth 2 (two) times daily as needed., Disp: 30 tablet, Rfl: 0 .  buPROPion (WELLBUTRIN XL) 150 MG 24 hr tablet, Take 1 tablet (150 mg total) by mouth daily., Disp: 30 tablet, Rfl: 2 .  citalopram (CELEXA) 20 MG tablet, Take 1 tablet (20 mg total) by mouth daily., Disp: 30 tablet, Rfl: 2  No Known Allergies   ROS  Constitutional: Negative for fever or significant  weight change.  Respiratory: Negative for cough and shortness of breath.   Cardiovascular: Negative for chest pain , positive for intermittent  palpitations.  Gastrointestinal: Negative for abdominal pain, no bowel changes.  Musculoskeletal: Negative for gait problem or joint  swelling.  Skin: Negative for rash.  Neurological: Negative for dizziness , positive for  headache.  No other specific complaints in a complete review of systems (except as listed in HPI above).  Objective  Vitals:   10/16/16 1030  BP: 120/80  Pulse: 95  Resp: 18  Temp: 98 F (36.7 C)  SpO2: 95%  Weight: 246 lb 1 oz (111.6 kg)  Height: 5\' 8"  (1.727 m)    Body mass index is 37.41 kg/m.  Physical Exam  Constitutional: Patient appears well-developed and well-nourished. Obese  No distress.  HEENT: head atraumatic, normocephalic, pupils equal and reactive to light,  neck supple, throat within normal limits Cardiovascular: Normal rate during exam, regular rhythm and normal heart sounds.  No murmur heard. No BLE edema. Pulmonary/Chest: Effort normal and breath sounds normal. No respiratory distress. Abdominal: Soft.  There is no tenderness. Psychiatric: Patient has a normal mood and affect. behavior is normal. Judgment and thought content normal. Muscular Skeletal: trigger points positive, decrease rom of neck is very tight Skin: hyperpigmented lesion with some satelite area, no redness now, or oozing  Recent Results (from the past 2160 hour(s))  TSH     Status: None   Collection Time: 09/15/16 11:45 AM  Result Value Ref Range   TSH 1.13 mIU/L    Comment:   Reference Range   > or = 20 Years  0.40-4.50   Pregnancy Range First trimester  0.26-2.66 Second trimester 0.55-2.73 Third trimester  0.43-2.91        PHQ2/9: Depression screen [facility] 2/9 06/29/2016 01/25/2016 12/20/2015 12/15/2015 10/22/2015  Decreased Interest 2 0 0 0 0  Down, Depressed, Hopeless - 0 0 0 0  PHQ - 2 Score 2 0 0 0 0  Altered sleeping 3 - - - -  Change in appetite (No Data) - - - -  Feeling bad or failure about yourself  0 - - - -  Trouble concentrating 0 - - - -  Moving slowly or fidgety/restless 0 - - - -  Suicidal thoughts 0 - - - -  PHQ-9 Score 5 - - - -  Difficult doing work/chores Not  difficult at all - - - -     Fall Risk: Fall Risk  06/29/2016 01/25/2016 12/20/2015 12/15/2015 10/22/2015  Falls in the past year? No No No No No     Assessment & Plan  1. Major depression, recurrent, chronic ([place])  She wants to resume Celexa and Wellbutrin since she feels worse since switched to Cymbalta, advised her to see Psychiatrist but she refuses to go at this time - citalopram (CELEXA) 20 MG tablet; Take 1 tablet (20 mg total) by mouth daily.  Dispense: 30 tablet; Refill: 2 - buPROPion (WELLBUTRIN XL) 150 MG 24 hr tablet; Take 1 tablet (150 mg total) by mouth daily.  Dispense: 30 tablet; Refill: 2  2. Adult hypothyroidism  TSH at goal  Continue current dose of Levothyroxine  3. Fibromyalgia syndrome  In a flare right now, can't steep  4. Gastro-esophageal reflux disease without esophagitis  Stable, discussed risk of QT prolongation with Celexa and Omeprazole, she has taken combination in the past and will tell Dr. Rockey Situ about it  5. Dyslipidemia  Seeing Dr. Rockey Situ and depending on stress test she will start statin therapy   6. Morbid obesity, unspecified obesity type ([place])  Joined weight watchers, trying to lose weight   7. Other specified hypothyroidism  - levothyroxine (SYNTHROID, LEVOTHROID) 200 MCG tablet; Take 1 tablet (200 mcg total) by mouth daily.  Dispense: 30 tablet; Refill: 2  8. Other insomnia  Discussed treating neck pain since this seems to be secondary to neck pain Took Trazodone in the past She is willing to try hydroxyzine.   9. Bilateral neck pain  She wants to try going to a massage therapist first  10. Tinea cruris  Discussed otc Jock's itchy

## 2016-10-16 NOTE — Telephone Encounter (Signed)
Confirmed stress echo appt 8/7, 10:30am. Reviewed instructions. Pt agreeable.

## 2016-10-17 ENCOUNTER — Ambulatory Visit (INDEPENDENT_AMBULATORY_CARE_PROVIDER_SITE_OTHER): Payer: 59

## 2016-10-17 DIAGNOSIS — R079 Chest pain, unspecified: Secondary | ICD-10-CM | POA: Diagnosis not present

## 2016-10-17 LAB — ECHOCARDIOGRAM STRESS TEST
CSEPEDS: 17 s
CSEPEW: 7 METS
CSEPHR: 103 %
Exercise duration (min): 5 min
MPHR: 164 {beats}/min
Peak HR: 169 {beats}/min
Rest HR: 120 {beats}/min

## 2016-10-19 ENCOUNTER — Ambulatory Visit: Payer: 59 | Admitting: Nurse Practitioner

## 2016-10-19 ENCOUNTER — Telehealth: Payer: Self-pay | Admitting: *Deleted

## 2016-10-19 DIAGNOSIS — R0602 Shortness of breath: Secondary | ICD-10-CM

## 2016-10-19 NOTE — Telephone Encounter (Signed)
Patient returning call.

## 2016-10-19 NOTE — Telephone Encounter (Signed)
Duplicate entry

## 2016-10-19 NOTE — Telephone Encounter (Signed)
Reviewed results and recommendations with patient and she was agreeable with lung work program offered at [facility]. Will place order and call over to rehab to see what process is needed for patient. Order entered for Lung works program and they will contact patient to set that up. Patient was appreciative for the call and had no further questions at this time.

## 2016-10-19 NOTE — Telephone Encounter (Signed)
-----   Message from Minna Merritts, MD sent at 10/18/2016 12:14 PM EDT ----- Stress test was normal BP very high with exertion, peak 169 systolic Can we see if she would be interested in and a candidate for lung work program at the hospital SOB, morbid obesity, chest pain If not a candidate or declines, needs regular exercise program

## 2016-10-30 ENCOUNTER — Telehealth: Payer: Self-pay | Admitting: Neurology

## 2016-10-30 ENCOUNTER — Ambulatory Visit: Payer: Self-pay | Admitting: Neurology

## 2016-10-30 NOTE — Telephone Encounter (Signed)
This patient did not show for a new patient appointment today. 

## 2016-10-31 ENCOUNTER — Telehealth: Payer: Self-pay | Admitting: *Deleted

## 2016-10-31 ENCOUNTER — Encounter: Payer: Self-pay | Admitting: Neurology

## 2016-10-31 NOTE — Telephone Encounter (Signed)
Would SOB from morbid obesity cover it? Otherwise we will just recommend forever fit TG

## 2016-10-31 NOTE — Telephone Encounter (Signed)
Message routed to Dr. Rockey Situ for review.

## 2016-10-31 NOTE — Telephone Encounter (Signed)
-----   Message from Lynford Humphrey, RN sent at 10/31/2016 11:56 AM EDT ----- Regarding: referral needs correction Good morning,   I received the referral for [facility].  I cannot find documentation of the sleep apnea anywhere in her chart.  That would be the only diagnosis that will meet entry criteria into the Pulmonary rehab program.  She does not have any diagnosis to meet entry criteria into Cardiac Rehab.   We do offer the [employer]- Maintenance/Clinical Supervised Exercise program if you feel that would be a good fit for her.  We offer classes 2 or 3 days a week  With our Cardiac/Pulmonary Rehab staff in the gym during the class.   Let me know what will work. Please send documentation of the sleep apnea if you want her in Pulmonary Rehab.  The documentation  Needs to be more than the diagnosis listed on the Problem List.   Thank you for your time with this referral, Wynona Canes

## 2016-11-01 NOTE — Telephone Encounter (Signed)
Spoke with Debra Soto in [place] and she states that in order for her to qualify for Cardiac/Pulmonary she would need to have obstructive sleep apnea listed in a office visit note and mentioned as part of her plan. Spoke with Dr. Rockey Situ regarding this and since we are unable to locate sleep study with diagnosis then please see if patient can start the [place]. Will route this message over to Debra Soto for further review and scheduling with patient.

## 2016-11-01 NOTE — Telephone Encounter (Signed)
Staff message sent to Heath Lark RN over at rehab for clarification of qualifications for programs.

## 2016-11-22 ENCOUNTER — Telehealth: Payer: Self-pay | Admitting: Cardiovascular Disease

## 2016-11-22 NOTE — Telephone Encounter (Signed)
[facility] Best boy form faxed to [employer] and placed in "Faxed" bin on [address].

## 2016-12-18 ENCOUNTER — Encounter: Payer: Self-pay | Admitting: Family Medicine

## 2016-12-18 ENCOUNTER — Ambulatory Visit (INDEPENDENT_AMBULATORY_CARE_PROVIDER_SITE_OTHER): Payer: 59 | Admitting: Family Medicine

## 2016-12-18 VITALS — BP 128/86 | HR 96 | Temp 97.7°F | Resp 18 | Ht 68.0 in | Wt 246.8 lb

## 2016-12-18 DIAGNOSIS — M797 Fibromyalgia: Secondary | ICD-10-CM

## 2016-12-18 DIAGNOSIS — M2550 Pain in unspecified joint: Secondary | ICD-10-CM

## 2016-12-18 DIAGNOSIS — G4709 Other insomnia: Secondary | ICD-10-CM

## 2016-12-18 DIAGNOSIS — Z23 Encounter for immunization: Secondary | ICD-10-CM | POA: Diagnosis not present

## 2016-12-18 DIAGNOSIS — K219 Gastro-esophageal reflux disease without esophagitis: Secondary | ICD-10-CM

## 2016-12-18 DIAGNOSIS — E039 Hypothyroidism, unspecified: Secondary | ICD-10-CM

## 2016-12-18 DIAGNOSIS — F339 Major depressive disorder, recurrent, unspecified: Secondary | ICD-10-CM | POA: Diagnosis not present

## 2016-12-18 DIAGNOSIS — R682 Dry mouth, unspecified: Secondary | ICD-10-CM

## 2016-12-18 MED ORDER — OMEPRAZOLE 20 MG PO CPDR: DELAYED_RELEASE_CAPSULE | ORAL | 5 refills | Status: DC

## 2016-12-18 MED ORDER — PREGABALIN 50 MG PO CAPS: 50.0000 mg | ORAL_CAPSULE | Freq: Two times a day (BID) | ORAL | 0 refills | Status: DC

## 2016-12-18 MED ORDER — HYDROXYZINE HCL 10 MG PO TABS: 10.0000 mg | ORAL_TABLET | Freq: Every day | ORAL | 0 refills | Status: DC

## 2016-12-18 MED ORDER — AMPHETAMINE-DEXTROAMPHET ER 15 MG PO CP24: 15.0000 mg | ORAL_CAPSULE | ORAL | 0 refills | Status: DC

## 2016-12-18 NOTE — Progress Notes (Signed)
Name: Debra Soto   MRN: [id number]    DOB: 11/10/1960   Date:12/18/2016       Progress Note  Subjective  Chief Complaint  Chief Complaint  Patient presents with  . Medication Refill    2 Month F/U  . Depression    Doing well with medication change  . Fibromyalgia    Is still having a flair up-her knuckles and hips are hurting her. Sleeping has improved slightly-sleeping around 4 hours nightly, if she gets 6 hours that is a wonderful night for her    HPI  Major Depression: she was on Duloxetine but asked to go back on Citalopram and Wellbutrin 10/2016, she states she has been  under more stress Explained that we switched because it was not working, but patient insisted, does not want to try another medication at this time, also refuses seeing psychiatrist. 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 She denies suicidal thoughts or ideation   FMS: she has been on a flare for the past month,  more body aches and mental fogginess. She was on Cymbalta but it did not seem to help with symptoms. Discussed Lyrica today  Arthralgia: she is having more joint stiffness and joint pains , more than her usual fibro symptoms. We will check for auto immune disorder since she has hypothyroidism, always has dry mouth   Patient Active Problem List   Diagnosis Date Noted  . Special screening for malignant neoplasms, colon   . Benign neoplasm of descending colon   . Heartburn   . Gastric polyp   . Psoriasis 01/25/2016  . 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 Endometriosis 12/31/2014  . Insomnia, persistent 09/29/2014  . IBS (irritable bowel syndrome) 09/29/2014  . 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 Dyslipidemia 09/29/2014  . Fibromyalgia syndrome 09/29/2014  . Gastro-esophageal reflux disease without esophagitis 09/29/2014  . Herpes labialis 09/29/2014  . Adult hypothyroidism 09/29/2014  . Family history of malignant neoplasm of breast 09/29/2014  .  Morbid obesity, unspecified obesity type ([place]) 09/29/2014  . Major depressive episode 09/29/2014  . Obstructive apnea 09/29/2014  . Abnormal presence of protein in urine 09/29/2014  . Allergic rhinitis 09/29/2014  . Vitamin D deficiency 09/29/2014  . Urge incontinence 09/29/2014  . Headache, tension-type 09/29/2014  . Menopausal symptom 09/29/2014    Past Surgical History:  Procedure Laterality Date  . ABDOMINAL HYSTERECTOMY  [id number]  . bladder tact    . CHOLECYSTECTOMY  [id number]  . COLONOSCOPY    . COLONOSCOPY WITH PROPOFOL N/A 02/10/2016   Procedure: COLONOSCOPY WITH PROPOFOL;  Surgeon: Lucilla Lame, MD;  Location: [place];  Service: Endoscopy;  Laterality: N/A;  . ESOPHAGOGASTRODUODENOSCOPY    . ESOPHAGOGASTRODUODENOSCOPY (EGD) WITH PROPOFOL N/A 02/10/2016   Procedure: ESOPHAGOGASTRODUODENOSCOPY (EGD) WITH PROPOFOL;  Surgeon: Lucilla Lame, MD;  Location: [place];  Service: Endoscopy;  Laterality: N/A;  sleep apnea  . LIPOMA EXCISION    . POLYPECTOMY  02/10/2016   Procedure: POLYPECTOMY;  Surgeon: Lucilla Lame, MD;  Location: [place];  Service: Endoscopy;;  . TONSILECTOMY, ADENOIDECTOMY, BILATERAL MYRINGOTOMY AND TUBES      Family History  Problem Relation Age of Onset  . Hypertension Mother   . Arthritis Mother   . Cancer Mother        ovarian,breast, lung, liver  . Breast cancer Mother 10  . Ovarian cancer Mother 61  . Lung cancer Mother 37  . Diabetes Brother   . Hypertension Brother   .  Other Father        lung problems  . Ovarian cancer Maternal Grandmother        pt thinks is was ovarian but not sure    Social History   Social History  . Marital status: Married    Spouse name: N/A  . Number of children: N/A  . Years of education: N/A   Occupational History  . Not on file.   Social History Main Topics  . Smoking status: Never Smoker  . Smokeless tobacco: Never Used  . Alcohol use 0.0 oz/week     Comment: rarely  .  Drug use: No  . Sexual activity: Yes   Other Topics Concern  . Not on file   Social History Narrative  . No narrative on file     Current Outpatient Prescriptions:  .  amphetamine-dextroamphetamine (ADDERALL XR) 15 MG 24 hr capsule, Take 1 capsule by mouth every morning., Disp: 30 capsule, Rfl: 0 .  buPROPion (WELLBUTRIN XL) 150 MG 24 hr tablet, Take 1 tablet (150 mg total) by mouth daily., Disp: 30 tablet, Rfl: 2 .  Ca Phosphate-Cholecalciferol (CALCIUM/VITAMIN D3 GUMMIES) 200-200 MG-UNIT CHEW, Chew 1 each by mouth daily., Disp: , Rfl:  .  citalopram (CELEXA) 20 MG tablet, Take 1 tablet (20 mg total) by mouth daily., Disp: 30 tablet, Rfl: 2 .  hydrOXYzine (ATARAX/VISTARIL) 10 MG tablet, Take 1-2 tablets (10-20 mg total) by mouth at bedtime., Disp: 30 tablet, Rfl: 0 .  levothyroxine (SYNTHROID, LEVOTHROID) 200 MCG tablet, Take 1 tablet (200 mcg total) by mouth daily., Disp: 30 tablet, Rfl: 2 .  omeprazole (PRILOSEC) 20 MG capsule, take 1 capsule by mouth twice a day BEFORE A MEAL, Disp: 60 capsule, Rfl: 5 .  valACYclovir (VALTREX) 1000 MG tablet, Take 1 tablet (1,000 mg total) by mouth 2 (two) times daily as needed., Disp: 30 tablet, Rfl: 0 .  betamethasone dipropionate (DIPROLENE) 0.05 % cream, apply A THIN LAYER to affected area twice a day UNTIL CLEAR, Disp: , Rfl: 0 .  nystatin-triamcinolone ointment (MYCOLOG), Apply 1 application topically 2 (two) times daily. (Patient not taking: Reported on 12/18/2016), Disp: 30 g, Rfl: 0  No Known Allergies   ROS  Constitutional: Negative for fever or weight change.  Respiratory: Negative for cough and shortness of breath.   Cardiovascular: Negative for chest pain or palpitations.  Gastrointestinal: Negative for abdominal pain, no bowel changes.  Musculoskeletal: Negative for gait problem or joint swelling.  Skin: Negative for rash.  Neurological: Positive  For intermittent  dizziness or headache.  No other specific complaints in a complete  review of systems (except as listed in HPI above).  Objective  Vitals:   12/18/16 0912  BP: 128/86  Pulse: 96  Resp: 18  Temp: 97.7 F (36.5 C)  TempSrc: Oral  SpO2: 99%  Weight: 246 lb 12.8 oz (111.9 kg)  Height: 5\' 8"  (1.727 m)    Body mass index is 37.53 kg/m.  Physical Exam  Constitutional: Patient appears well-developed and well-nourished. Obese  No distress.  HEENT: head atraumatic, normocephalic, pupils equal and reactive to light,  neck supple, throat within normal limits Cardiovascular: Normal rate, regular rhythm and normal heart sounds.  No murmur heard. No BLE edema. Pulmonary/Chest: Effort normal and breath sounds normal. No respiratory distress. Abdominal: Soft.  There is no tenderness. Psychiatric: Patient has a normal mood and affect. behavior is normal. Judgment and thought content normal. Muscular Skeletal: trigger point positive, no synovitis or erythema  Recent Results (  from the past 2160 hour(s))  ECHOCARDIOGRAM STRESS TEST     Status: None   Collection Time: 10/17/16 11:20 AM  Result Value Ref Range   Rest HR 120 bpm   Rest BP 137/84 mmHg   Exercise duration (sec) 17 sec   Percent HR 103 %   Exercise duration (min) 5 min   Estimated workload 7.0 METS   Peak HR 169 bpm   Peak BP 240/59 mmHg   MPHR 164 bpm     PHQ2/9: Depression screen [employer] 2/9 12/18/2016 06/29/2016 01/25/2016 12/20/2015 12/15/2015  Decreased Interest 0 2 0 0 0  Down, Depressed, Hopeless 0 - 0 0 0  PHQ - 2 Score 0 2 0 0 0  Altered sleeping - 3 - - -  Change in appetite - (No Data) - - -  Feeling bad or failure about yourself  - 0 - - -  Trouble concentrating - 0 - - -  Moving slowly or fidgety/restless - 0 - - -  Suicidal thoughts - 0 - - -  PHQ-9 Score - 5 - - -  Difficult doing work/chores - Not difficult at all - - -     Fall Risk: Fall Risk  12/18/2016 06/29/2016 01/25/2016 12/20/2015 12/15/2015  Falls in the past year? Yes No No No No  Number falls in past yr: 1 - - - -   Injury with Fall? No - - - -    Functional Status Survey: Is the patient deaf or have difficulty hearing?: No Does the patient have difficulty seeing, even when wearing glasses/contacts?: No Does the patient have difficulty concentrating, remembering, or making decisions?: No Does the patient have difficulty walking or climbing stairs?: No Does the patient have difficulty dressing or bathing?: No Does the patient have difficulty doing errands alone such as visiting a doctor's office or shopping?: No    Assessment & Plan  1. Major depression, recurrent, chronic (HCC)  - amphetamine-dextroamphetamine (ADDERALL XR) 15 MG 24 hr capsule; Take 1 capsule by mouth every morning.  Dispense: 30 capsule; Refill: 0 - Comprehensive metabolic panel  2. Needs flu shot  - Flu Vaccine QUAD 6+ mos PF IM (Fluarix Quad PF)  3. Adult hypothyroidism  - TSH  4. Fibromyalgia syndrome  - pregabalin (LYRICA) 50 MG capsule; Take 1-2 capsules (50-100 mg total) by mouth 2 (two) times daily.  Dispense: 90 capsule; Refill: 0  5. Gastro-esophageal reflux disease without esophagitis  - omeprazole (PRILOSEC) 20 MG capsule; take 1 capsule by mouth twice a day BEFORE A MEAL  Dispense: 60 capsule; Refill: 5  6. Other insomnia  - hydrOXYzine (ATARAX/VISTARIL) 10 MG tablet; Take 1-2 tablets (10-20 mg total) by mouth at bedtime.  Dispense: 30 tablet; Refill: 0  7. Dry mouth  - Sjogrens syndrome-A extractable nuclear antibody - Sjogrens syndrome-B extractable nuclear antibody  8. Arthralgia, unspecified joint  - Rheumatoid Factor - Sjogrens syndrome-A extractable nuclear antibody - Sjogrens syndrome-B extractable nuclear antibody - ANA, IFA Comprehensive Panel

## 2016-12-20 LAB — COMPREHENSIVE METABOLIC PANEL
AG Ratio: 1.1 (calc) (ref 1.0–2.5)
ALKALINE PHOSPHATASE (APISO): 119 U/L (ref 33–130)
ALT: 9 U/L (ref 6–29)
AST: 10 U/L (ref 10–35)
Albumin: 3.7 g/dL (ref 3.6–5.1)
BUN: 13 mg/dL (ref 7–25)
CHLORIDE: 104 mmol/L (ref 98–110)
CO2: 29 mmol/L (ref 20–32)
CREATININE: 1.02 mg/dL (ref 0.50–1.05)
Calcium: 9.4 mg/dL (ref 8.6–10.4)
GLUCOSE: 86 mg/dL (ref 65–99)
Globulin: 3.4 g/dL (calc) (ref 1.9–3.7)
POTASSIUM: 4 mmol/L (ref 3.5–5.3)
Sodium: 141 mmol/L (ref 135–146)
Total Bilirubin: 0.3 mg/dL (ref 0.2–1.2)
Total Protein: 7.1 g/dL (ref 6.1–8.1)

## 2016-12-20 LAB — RHEUMATOID FACTOR

## 2016-12-20 LAB — ANA, IFA COMPREHENSIVE PANEL
Anti Nuclear Antibody(ANA): NEGATIVE
ENA SM AB SER-ACNC: NEGATIVE AI
SM/RNP: NEGATIVE AI
SSA (Ro) (ENA) Antibody, IgG: <1 AI
SSB (LA) (ENA) ANTIBODY, IGG: NEGATIVE AI
Scleroderma (Scl-70) (ENA) Antibody, IgG: <1 AI
ds DNA Ab: <1 IU/mL

## 2016-12-20 LAB — TSH: TSH: 1.55 mIU/L (ref 0.40–4.50)

## 2016-12-22 ENCOUNTER — Telehealth: Payer: Self-pay

## 2016-12-22 NOTE — Telephone Encounter (Signed)
Patient notified of blood work.

## 2016-12-29 NOTE — Telephone Encounter (Signed)
Debra Soto called from [place] and she needs specific documentation discussing her shortness of breath, obstructive sleep apnea, and morbid obesity in order for her to qualify insurance to cover. [employer] is $42.00 a month and some insurances will cover this. Debra Soto is going to research her chart more but she does want some additional documentation of those specific symptoms.

## 2016-12-29 NOTE — Telephone Encounter (Signed)
Spoke with Collie Siad at [facility]   referral in [place] entered at [employer]   Will need referral for pulm rehab reason or dx has to be sufficiently documented not just listed as a problem

## 2016-12-29 NOTE — Telephone Encounter (Signed)
I have updated her note Known obstructive sleep apnea just not using her CPAP Chronic shortness of breath secondary to morbid obesity, sleep apnea thx TG

## 2016-12-29 NOTE — Addendum Note (Signed)
Addended by: Valora Corporal on: 12/29/2016 02:49 PM   Modules accepted: Orders

## 2016-12-29 NOTE — Addendum Note (Signed)
Addended by: Valora Corporal on: 12/29/2016 03:17 PM   Modules accepted: Orders

## 2016-12-29 NOTE — Telephone Encounter (Signed)
Spoke with patient and she has not been contacted about rehab. Reviewed Dr. Donivan Scull recommendations and she said if it was covered by insurance she would definitely be interested. Let her know that I would reach out to rehab to see if she would qualify for any of their available programs. She was appreciative for the call and had no further questions at this time.

## 2016-12-29 NOTE — Telephone Encounter (Signed)
No answer. Left message with Collie Siad at [facility] to call back to discuss.

## 2017-01-03 ENCOUNTER — Other Ambulatory Visit: Payer: Self-pay | Admitting: *Deleted

## 2017-01-03 DIAGNOSIS — R0602 Shortness of breath: Secondary | ICD-10-CM

## 2017-01-08 ENCOUNTER — Telehealth: Payer: Self-pay | Admitting: Family Medicine

## 2017-01-08 NOTE — Telephone Encounter (Signed)
Pt. Called and states she can not remember who she saw for dermatology. Gave her Dr. Justin Mend and Hassan Rowan Sandridge's name. States she remembers that she did see them for her psoriasis.

## 2017-01-08 NOTE — Telephone Encounter (Signed)
Copied from [place] [id number]. Topic: Inquiry >> Jan 08, 2017 11:53 AM Corie Chiquito, [place] wrote: Reason for [id number] has Sever Psoriasis and has broken out into a rash

## 2017-01-19 ENCOUNTER — Ambulatory Visit: Payer: 59 | Admitting: Family Medicine

## 2017-01-29 ENCOUNTER — Other Ambulatory Visit: Payer: Self-pay | Admitting: Family Medicine

## 2017-01-29 DIAGNOSIS — F339 Major depressive disorder, recurrent, unspecified: Secondary | ICD-10-CM

## 2017-01-30 NOTE — Telephone Encounter (Signed)
Needs follow-up

## 2017-02-21 ENCOUNTER — Other Ambulatory Visit: Payer: Self-pay | Admitting: Family Medicine

## 2017-02-21 DIAGNOSIS — E038 Other specified hypothyroidism: Secondary | ICD-10-CM

## 2017-02-21 NOTE — Telephone Encounter (Addendum)
Refill request for thyroid medication: Levothyroxine 200 mg  Last Physical: 02/24/2015  Lab Results  Component Value Date   TSH 1.55 12/18/2016   Follow up visit: None indicated

## 2017-02-21 NOTE — Telephone Encounter (Signed)
Needs follow up , 3 months from last visit,

## 2017-03-20 ENCOUNTER — Ambulatory Visit: Payer: 59 | Admitting: Family Medicine

## 2017-04-05 ENCOUNTER — Encounter: Payer: Self-pay | Admitting: Family Medicine

## 2017-04-05 ENCOUNTER — Ambulatory Visit: Payer: Self-pay | Admitting: Family Medicine

## 2017-04-05 ENCOUNTER — Ambulatory Visit (INDEPENDENT_AMBULATORY_CARE_PROVIDER_SITE_OTHER): Payer: Self-pay | Admitting: Family Medicine

## 2017-04-05 VITALS — BP 130/80 | HR 100 | Temp 98.1°F | Resp 16 | Ht 68.0 in | Wt 258.4 lb

## 2017-04-05 DIAGNOSIS — E038 Other specified hypothyroidism: Secondary | ICD-10-CM

## 2017-04-05 DIAGNOSIS — N3001 Acute cystitis with hematuria: Secondary | ICD-10-CM

## 2017-04-05 LAB — POCT URINALYSIS DIPSTICK
Bilirubin, UA: NEGATIVE
GLUCOSE UA: NEGATIVE
KETONES UA: NEGATIVE
Nitrite, UA: NEGATIVE
SPEC GRAV UA: 1.02 (ref 1.010–1.025)
Urobilinogen, UA: NEGATIVE E.U./dL — AB
pH, UA: 5 (ref 5.0–8.0)

## 2017-04-05 MED ORDER — SULFAMETHOXAZOLE-TRIMETHOPRIM 800-160 MG PO TABS: 1.0000 | ORAL_TABLET | Freq: Two times a day (BID) | ORAL | 0 refills | Status: AC

## 2017-04-05 MED ORDER — LEVOTHYROXINE SODIUM 200 MCG PO TABS: 200.0000 ug | ORAL_TABLET | Freq: Every day | ORAL | 0 refills | Status: DC

## 2017-04-05 NOTE — Patient Instructions (Signed)

## 2017-04-05 NOTE — Progress Notes (Signed)
Name: Debra Soto   MRN: [id number]    DOB: [birth year]   Date:04/05/2017       Progress Note  Subjective  Chief Complaint  Chief Complaint  Patient presents with  . Urinary Tract Infection    painful and burning    HPI  Pt presents with 2 days of dysuria, low abdominal pain, urinary urgency and frequency.  Denies NVD, fevers/chills, no history of kidney stones, no hematuria.  She has history of UTI's occasionally in the past.  Hypothyroidism: Pt missed 34mo follow up w/ labs and is out of her synthroid - we will provide 14 day supply today to give her time to make new appointment.  She denies palpitaitons, chest pain, shortness of breath, skin/hair/nail changes.  She wants to wait on labs until her follow up.   Patient Active Problem List   Diagnosis Date Noted  . Special screening for malignant neoplasms, colon   . Benign neoplasm of descending colon   . Heartburn   . Gastric polyp   . Psoriasis 01/25/2016  . 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 Endometriosis 12/31/2014  . Insomnia, persistent 09/29/2014  . IBS (irritable bowel syndrome) 09/29/2014  . 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 Dyslipidemia 09/29/2014  . Fibromyalgia syndrome 09/29/2014  . Gastro-esophageal reflux disease without esophagitis 09/29/2014  . Herpes labialis 09/29/2014  . Adult hypothyroidism 09/29/2014  . Family history of malignant neoplasm of breast 09/29/2014  . Morbid obesity, unspecified obesity type ([place]) 09/29/2014  . Major depressive episode 09/29/2014  . Obstructive apnea 09/29/2014  . Abnormal presence of protein in urine 09/29/2014  . Allergic rhinitis 09/29/2014  . Vitamin D deficiency 09/29/2014  . Urge incontinence 09/29/2014  . Headache, tension-type 09/29/2014  . Menopausal symptom 09/29/2014    Social History   Tobacco Use  . Smoking status: Never Smoker  . Smokeless tobacco: Never Used  Substance Use Topics  . Alcohol use: Yes    Alcohol/week: 0.0 oz    Comment:  rarely     Current Outpatient Medications:  .  amphetamine-dextroamphetamine (ADDERALL XR) 15 MG 24 hr capsule, Take 1 capsule by mouth every morning., Disp: 30 capsule, Rfl: 0 .  betamethasone dipropionate (DIPROLENE) 0.05 % cream, apply A THIN LAYER to affected area twice a day UNTIL CLEAR, Disp: , Rfl: 0 .  buPROPion (WELLBUTRIN XL) 150 MG 24 hr tablet, TAKE 1 TABLET(150 MG) BY MOUTH DAILY, Disp: 30 tablet, Rfl: 1 .  Ca Phosphate-Cholecalciferol (CALCIUM/VITAMIN D3 GUMMIES) 200-200 MG-UNIT CHEW, Chew 1 each by mouth daily., Disp: , Rfl:  .  citalopram (CELEXA) 20 MG tablet, TAKE 1 TABLET(20 MG) BY MOUTH DAILY, Disp: 30 tablet, Rfl: 1 .  hydrOXYzine (ATARAX/VISTARIL) 10 MG tablet, Take 1-2 tablets (10-20 mg total) by mouth at bedtime., Disp: 30 tablet, Rfl: 0 .  levothyroxine (SYNTHROID, LEVOTHROID) 200 MCG tablet, TAKE 1 TABLET(200 MCG) BY MOUTH DAILY, Disp: 30 tablet, Rfl: 0 .  omeprazole (PRILOSEC) 20 MG capsule, take 1 capsule by mouth twice a day BEFORE A MEAL, Disp: 60 capsule, Rfl: 5 .  pregabalin (LYRICA) 50 MG capsule, Take 1-2 capsules (50-100 mg total) by mouth 2 (two) times daily., Disp: 90 capsule, Rfl: 0 .  valACYclovir (VALTREX) 1000 MG tablet, Take 1 tablet (1,000 mg total) by mouth 2 (two) times daily as needed., Disp: 30 tablet, Rfl: 0  No Known Allergies  ROS  Constitutional: Negative for fever or weight change.  Respiratory: Negative for cough and shortness of breath.   Cardiovascular: Negative for  chest pain or palpitations.  Gastrointestinal: See HPI Musculoskeletal: Negative for gait problem or joint swelling.  Skin: Negative for rash.  Neurological: Negative for dizziness or headache.  No other specific complaints in a complete review of systems (except as listed in HPI above).  Objective  Vitals:   04/05/17 0921  BP: 130/80  Pulse: 100  Resp: 16  Temp: 98.1 F (36.7 C)  TempSrc: Oral  SpO2: 97%  Weight: 258 lb 6.4 oz (117.2 kg)  Height: 5\' 8"  (1.727  m)    Body mass index is 39.29 kg/m.  Nursing Note and Vital Signs reviewed.  Physical Exam  Constitutional: Patient appears well-developed and well-nourished. Obese. No distress.  HEENT: head atraumatic, normocephalic Cardiovascular: Normal rate, regular rhythm, S1/S2 present.  No murmur or rub heard. No BLE edema. Pulmonary/Chest: Effort normal and breath sounds clear. No respiratory distress or retractions. Abdominal: Soft and mild tenderness to bilateral lower abdomen, bowel sounds present x4 quadrants.  No CVA Tenderness Psychiatric: Patient has a normal mood and affect. behavior is normal. Judgment and thought content normal.  Results for orders placed or performed in visit on 04/05/17 (from the past 72 hour(s))  POCT urinalysis dipstick     Status: Abnormal   Collection Time: 04/05/17  9:29 AM  Result Value Ref Range   Color, UA yellow    Clarity, UA cloudy    Glucose, UA negative    Bilirubin, UA negative    Ketones, UA negative    Spec Grav, UA 1.020 1.010 - 1.025   Blood, UA large    pH, UA 5.0 5.0 - 8.0   Protein, UA trace    Urobilinogen, UA negative (A) 0.2 or 1.0 E.U./dL   Nitrite, UA negative    Leukocytes, UA Large (3+) (A) Negative   Appearance cloudy    Odor none    Assessment & Plan  1. Acute cystitis with hematuria - POCT urinalysis dipstick - Urine Culture - sulfamethoxazole-trimethoprim (BACTRIM DS,SEPTRA DS) 800-160 MG tablet; Take 1 tablet by mouth 2 (two) times daily for 3 days.  Dispense: 6 tablet; Refill: 0 - Follow up in 2.5 weeks for POCT urine dip to ensure hematuria resolution.  2. Other specified hypothyroidism - levothyroxine (SYNTHROID, LEVOTHROID) 200 MCG tablet; Take 1 tablet (200 mcg total) by mouth daily before breakfast.  Dispense: 14 tablet; Refill: 0 - Advised no additional refills will be provided until labs and follow up visit.  -Red flags and when to present for emergency care or RTC including fever >101.49F, chest pain,  shortness of breath, new/worsening/un-resolving symptoms, severe abdominal pain, blood in urine, flank/back pain reviewed with patient at time of visit. Follow up and care instructions discussed and provided in AVS.

## 2017-04-08 LAB — URINE CULTURE
MICRO NUMBER:: 90103643
SPECIMEN QUALITY:: ADEQUATE

## 2017-04-24 ENCOUNTER — Ambulatory Visit: Payer: Self-pay

## 2017-05-11 ENCOUNTER — Other Ambulatory Visit: Payer: Self-pay | Admitting: Family Medicine

## 2017-05-11 DIAGNOSIS — E038 Other specified hypothyroidism: Secondary | ICD-10-CM

## 2017-05-14 ENCOUNTER — Other Ambulatory Visit: Payer: Self-pay | Admitting: Family Medicine

## 2017-05-14 DIAGNOSIS — E039 Hypothyroidism, unspecified: Secondary | ICD-10-CM

## 2017-05-14 NOTE — Telephone Encounter (Signed)
Called pt to schedule an appt and pt states she will come in today to get labs done

## 2017-05-14 NOTE — Telephone Encounter (Signed)
She needs labs and follow up, please ask her to come in today for labs, and schedule a follow up.

## 2017-05-14 NOTE — Telephone Encounter (Signed)
Pt had labs done today, she scheduled an appt but she is completely out of her thyroid meds. Please advise

## 2017-05-15 ENCOUNTER — Other Ambulatory Visit: Payer: Self-pay | Admitting: Family Medicine

## 2017-05-15 LAB — TSH: TSH: 12.9 mIU/L — ABNORMAL HIGH (ref 0.40–4.50)

## 2017-05-15 NOTE — Telephone Encounter (Signed)
Pt.notified

## 2017-05-16 ENCOUNTER — Other Ambulatory Visit: Payer: Self-pay | Admitting: Family Medicine

## 2017-05-16 DIAGNOSIS — E039 Hypothyroidism, unspecified: Secondary | ICD-10-CM

## 2017-05-17 ENCOUNTER — Ambulatory Visit: Payer: Self-pay | Admitting: Family Medicine

## 2017-05-31 ENCOUNTER — Other Ambulatory Visit: Payer: Self-pay | Admitting: Family Medicine

## 2017-05-31 DIAGNOSIS — F339 Major depressive disorder, recurrent, unspecified: Secondary | ICD-10-CM

## 2017-06-29 ENCOUNTER — Other Ambulatory Visit: Payer: Self-pay | Admitting: Family Medicine

## 2017-06-29 DIAGNOSIS — E038 Other specified hypothyroidism: Secondary | ICD-10-CM

## 2017-07-06 ENCOUNTER — Encounter: Payer: Self-pay | Admitting: Family Medicine

## 2017-07-06 ENCOUNTER — Ambulatory Visit (INDEPENDENT_AMBULATORY_CARE_PROVIDER_SITE_OTHER): Payer: 59 | Admitting: Family Medicine

## 2017-07-06 ENCOUNTER — Other Ambulatory Visit: Payer: Self-pay | Admitting: Family Medicine

## 2017-07-06 DIAGNOSIS — Z1231 Encounter for screening mammogram for malignant neoplasm of breast: Secondary | ICD-10-CM

## 2017-07-06 DIAGNOSIS — G4709 Other insomnia: Secondary | ICD-10-CM | POA: Diagnosis not present

## 2017-07-06 DIAGNOSIS — E038 Other specified hypothyroidism: Secondary | ICD-10-CM | POA: Diagnosis not present

## 2017-07-06 DIAGNOSIS — B001 Herpesviral vesicular dermatitis: Secondary | ICD-10-CM | POA: Diagnosis not present

## 2017-07-06 DIAGNOSIS — F339 Major depressive disorder, recurrent, unspecified: Secondary | ICD-10-CM

## 2017-07-06 DIAGNOSIS — K219 Gastro-esophageal reflux disease without esophagitis: Secondary | ICD-10-CM | POA: Diagnosis not present

## 2017-07-06 MED ORDER — HYDROXYZINE HCL 25 MG PO TABS: 25.0000 mg | ORAL_TABLET | Freq: Every evening | ORAL | 0 refills | Status: DC | PRN

## 2017-07-06 MED ORDER — LEVOTHYROXINE SODIUM 200 MCG PO TABS: ORAL_TABLET | ORAL | 0 refills | Status: DC

## 2017-07-06 MED ORDER — OMEPRAZOLE 20 MG PO CPDR: DELAYED_RELEASE_CAPSULE | ORAL | 1 refills | Status: DC

## 2017-07-06 MED ORDER — VALACYCLOVIR HCL 1 G PO TABS: 1000.0000 mg | ORAL_TABLET | Freq: Two times a day (BID) | ORAL | 0 refills | Status: DC | PRN

## 2017-07-06 MED ORDER — CITALOPRAM HYDROBROMIDE 20 MG PO TABS: ORAL_TABLET | ORAL | 1 refills | Status: DC

## 2017-07-06 MED ORDER — BUPROPION HCL ER (XL) 150 MG PO TB24: ORAL_TABLET | ORAL | 1 refills | Status: DC

## 2017-07-06 MED ORDER — AMPHETAMINE-DEXTROAMPHET ER 15 MG PO CP24: 15.0000 mg | ORAL_CAPSULE | ORAL | 0 refills | Status: DC

## 2017-07-06 NOTE — Progress Notes (Signed)
Name: Debra Soto   MRN: [id number]    DOB: March 02, 1961   Date:07/06/2017       Progress Note  Subjective  Chief Complaint  Chief Complaint  Patient presents with  . Medication Refill  . Depression    Trying to sell a house and daughter is going a divorce.  . Fibromyalgia  . Hypothyroidism    Has been without medication for 1 week F/U-Has been in [place] since her sister had a heart attack    HPI  Hypothyroidism: last TSH was very high, however she was out of medication, she came in today to have it rechecked, however she has been out again for the past week. We will send a 90 days supply and advised her to return in 4-8 weeks for repeat TSH and adjust medication if needed. Weight is stable, no dysphagia, no hair loss. She has dry skin but stable  Major Depression: she was on Duloxetine but asked to go back on Citalopram and Wellbutrin 10/2016, she states she has been  under more stress now, husband has a new job in [place] and they are trying to sell their house and buy a new one now. She states she has low energy and would like to go back on Adderal  FMS: she stopped Lyrica because it causes her to get moody. She has trigger point positive  Insomnia: took Trazodone but does not want to go back at this time. We will try Hydroxyzine as off label indication since she does not recall taking it from last visit.    Dyslipidemia: LDL is 140, seen by Dr. Rockey Situ, had a normal stress test, heart rate can go up intermittently, explained that we need to recheck heart rate when taking adderal to make sure bp and heart rate does not go up significantly.   GERD: on Omeprazol and stable, no heartburn or indigestion with medication, she is aware of long term risk of PPI, she tried weaning self off but unable to tolerate.   Morbid obesity: discussed life style modification and importance of eating healthier.   Patient Active Problem List   Diagnosis Date Noted  . Special screening for  malignant neoplasms, colon   . Benign neoplasm of descending colon   . Heartburn   . Gastric polyp   . Psoriasis 01/25/2016  . 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 Endometriosis 12/31/2014  . Insomnia, persistent 09/29/2014  . IBS (irritable bowel syndrome) 09/29/2014  . 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 Dyslipidemia 09/29/2014  . Fibromyalgia syndrome 09/29/2014  . Gastro-esophageal reflux disease without esophagitis 09/29/2014  . Herpes labialis 09/29/2014  . Adult hypothyroidism 09/29/2014  . Family history of malignant neoplasm of breast 09/29/2014  . Morbid obesity, unspecified obesity type ([place]) 09/29/2014  . Major depressive episode 09/29/2014  . Obstructive apnea 09/29/2014  . Abnormal presence of protein in urine 09/29/2014  . Allergic rhinitis 09/29/2014  . Vitamin D deficiency 09/29/2014  . Urge incontinence 09/29/2014  . Headache, tension-type 09/29/2014  . Menopausal symptom 09/29/2014    Past Surgical History:  Procedure Laterality Date  . ABDOMINAL HYSTERECTOMY  [id number]  . bladder tact    . CHOLECYSTECTOMY  [id number]  . COLONOSCOPY    . COLONOSCOPY WITH PROPOFOL N/A 02/10/2016   Procedure: COLONOSCOPY WITH PROPOFOL;  Surgeon: Lucilla Lame, MD;  Location: [place];  Service: Endoscopy;  Laterality: N/A;  . ESOPHAGOGASTRODUODENOSCOPY    . ESOPHAGOGASTRODUODENOSCOPY (EGD) WITH PROPOFOL N/A 02/10/2016   Procedure: ESOPHAGOGASTRODUODENOSCOPY (EGD) WITH PROPOFOL;  Surgeon: Lucilla Lame, MD;  Location: [place];  Service: Endoscopy;  Laterality: N/A;  sleep apnea  . LIPOMA EXCISION    . POLYPECTOMY  02/10/2016   Procedure: POLYPECTOMY;  Surgeon: Lucilla Lame, MD;  Location: [place];  Service: Endoscopy;;  . TONSILECTOMY, ADENOIDECTOMY, BILATERAL MYRINGOTOMY AND TUBES      Family History  Problem Relation Age of Onset  . Hypertension Mother   . Arthritis Mother   . Cancer Mother        ovarian,breast, lung, liver  .  Breast cancer Mother 63  . Ovarian cancer Mother 33  . Lung cancer Mother 31  . Diabetes Brother   . Hypertension Brother   . Other Father        lung problems  . Ovarian cancer Maternal Grandmother        pt thinks is was ovarian but not sure    Social History   Socioeconomic History  . Marital status: Married    Spouse name: Elenore Rota  . Number of children: 2  . Years of education: Not on file  . Highest education level: 12th grade  Occupational History  . Occupation: Psychologist, occupational as Chiropodist     Comment: [employer]  Social Needs  . Financial resource strain: Not hard at all  . Food insecurity:    Worry: Never true    Inability: Never true  . Transportation needs:    Medical: No    Non-medical: No  Tobacco Use  . Smoking status: Never Smoker  . Smokeless tobacco: Never Used  Substance and Sexual Activity  . Alcohol use: Yes    Alcohol/week: 0.0 oz    Comment: rarely  . Drug use: No  . Sexual activity: Yes  Lifestyle  . Physical activity:    Days per week: 0 days    Minutes per session: 0 min  . Stress: Very much  Relationships  . Social connections:    Talks on phone: Not on file    Gets together: Not on file    Attends religious service: Not on file    Active member of club or organization: Not on file    Attends meetings of clubs or organizations: Not on file    Relationship status: Not on file  . Intimate partner violence:    Fear of current or ex partner: No    Emotionally abused: No    Physically abused: No    Forced sexual activity: No  Other Topics Concern  . Not on file  Social History Narrative  . Not on file     Current Outpatient Medications:  .  amphetamine-dextroamphetamine (ADDERALL XR) 15 MG 24 hr capsule, Take 1 capsule by mouth every morning., Disp: 30 capsule, Rfl: 0 .  betamethasone dipropionate (DIPROLENE) 0.05 % cream, apply A THIN LAYER to affected area twice a day UNTIL CLEAR, Disp: , Rfl: 0 .  buPROPion  (WELLBUTRIN XL) 150 MG 24 hr tablet, TAKE 1 TABLET(150 MG) BY MOUTH DAILY, Disp: 90 tablet, Rfl: 1 .  Ca Phosphate-Cholecalciferol (CALCIUM/VITAMIN D3 GUMMIES) 200-200 MG-UNIT CHEW, Chew 1 each by mouth daily., Disp: , Rfl:  .  citalopram (CELEXA) 20 MG tablet, TAKE 1 TABLET(20 MG) BY MOUTH DAILY, Disp: 90 tablet, Rfl: 1 .  levothyroxine (SYNTHROID, LEVOTHROID) 200 MCG tablet, TAKE 1 TABLET BY MOUTH DAILY BEFORE BREAKFAST( NEEDS TO COME IN FOR FOLLOW UP FOR 90 DAY SUPPLY), Disp: 90 tablet, Rfl: 0 .  omeprazole (PRILOSEC) 20 MG capsule, take 1  capsule by mouth twice a day BEFORE A MEAL, Disp: 180 capsule, Rfl: 1 .  valACYclovir (VALTREX) 1000 MG tablet, Take 1 tablet (1,000 mg total) by mouth 2 (two) times daily as needed., Disp: 30 tablet, Rfl: 0 .  hydrOXYzine (ATARAX/VISTARIL) 25 MG tablet, Take 1 tablet (25 mg total) by mouth at bedtime as needed. Sleep and anxiety, Disp: 90 tablet, Rfl: 0  No Known Allergies  I personally reviewed Family, Social and Surgical history with the patient/caregiver today.    ROS  Constitutional: Negative for fever or weight change.  Respiratory: Negative for cough and shortness of breath.   Cardiovascular: Negative for chest pain or palpitations.  Gastrointestinal: Negative for abdominal pain, no bowel changes.  Musculoskeletal: Negative for gait problem or joint swelling.  Skin: Negative for rash.  Neurological: Negative for dizziness or headache.  No other specific complaints in a complete review of systems (except as listed in HPI above).  Objective  Vitals:   07/06/17 1005  BP: 118/84  Pulse: (!) 106  Resp: 18  Temp: 97.9 F (36.6 C)  TempSrc: Oral  SpO2: 95%  Weight: 260 lb 14.4 oz (118.3 kg)  Height: 5\' 8"  (1.727 m)    Body mass index is 39.67 kg/m.  Physical Exam  Constitutional: Patient appears well-developed and well-nourished. Obese  No distress.  HEENT: head atraumatic, normocephalic, pupils equal and reactive to light,  neck  supple, throat within normal limits Cardiovascular: Normal rate, regular rhythm and normal heart sounds.  No murmur heard. No BLE edema. Pulmonary/Chest: Effort normal and breath sounds normal. No respiratory distress. Abdominal: Soft.  There is no tenderness. Psychiatric: Patient has a normal mood and affect. behavior is normal. Judgment and thought content normal. Muscular Skeletal: trigger points positive   Recent Results (from the past 2160 hour(s))  TSH     Status: Abnormal   Collection Time: 05/14/17  3:14 PM  Result Value Ref Range   TSH 12.90 (H) 0.40 - 4.50 mIU/L    PHQ2/9: Depression screen [facility] 2/9 07/06/2017 12/18/2016 06/29/2016 01/25/2016 12/20/2015  Decreased Interest 0 0 2 0 0  Down, Depressed, Hopeless 1 0 - 0 0  PHQ - 2 Score 1 0 2 0 0  Altered sleeping 2 - 3 - -  Tired, decreased energy 2 - - - -  Change in appetite 2 - (No Data) - -  Feeling bad or failure about yourself  1 - 0 - -  Trouble concentrating 3 - 0 - -  Moving slowly or fidgety/restless 1 - 0 - -  Suicidal thoughts 0 - 0 - -  PHQ-9 Score 12 - 5 - -  Difficult doing work/chores Somewhat difficult - Not difficult at all - -     Fall Risk: Fall Risk  07/06/2017 12/18/2016 06/29/2016 01/25/2016 12/20/2015  Falls in the past year? No Yes No No No  Number falls in past yr: - 1 - - -  Injury with Fall? - No - - -     Functional Status Survey: Is the patient deaf or have difficulty hearing?: Yes(Any background noise unable to hear) Does the patient have difficulty seeing, even when wearing glasses/contacts?: No Does the patient have difficulty concentrating, remembering, or making decisions?: Yes Does the patient have difficulty walking or climbing stairs?: No Does the patient have difficulty dressing or bathing?: No Does the patient have difficulty doing errands alone such as visiting a doctor's office or shopping?: No   Assessment & Plan  1. Other specified  hypothyroidism  - levothyroxine  (SYNTHROID, LEVOTHROID) 200 MCG tablet; TAKE 1 TABLET BY MOUTH DAILY BEFORE BREAKFAST( NEEDS TO COME IN FOR FOLLOW UP FOR 90 DAY SUPPLY)  Dispense: 90 tablet; Refill: 0  2. Gastro-esophageal reflux disease without esophagitis  - omeprazole (PRILOSEC) 20 MG capsule; take 1 capsule by mouth twice a day BEFORE A MEAL  Dispense: 180 capsule; Refill: 1  3. Major depression, recurrent, chronic (HCC)  - citalopram (CELEXA) 20 MG tablet; TAKE 1 TABLET(20 MG) BY MOUTH DAILY  Dispense: 90 tablet; Refill: 1 - buPROPion (WELLBUTRIN XL) 150 MG 24 hr tablet; TAKE 1 TABLET(150 MG) BY MOUTH DAILY  Dispense: 90 tablet; Refill: 1 - amphetamine-dextroamphetamine (ADDERALL XR) 15 MG 24 hr capsule; Take 1 capsule by mouth every morning.  Dispense: 30 capsule; Refill: 0  4. Fever blister  - valACYclovir (VALTREX) 1000 MG tablet; Take 1 tablet (1,000 mg total) by mouth 2 (two) times daily as needed.  Dispense: 30 tablet; Refill: 0  5. Other insomnia  - hydrOXYzine (ATARAX/VISTARIL) 25 MG tablet; Take 1 tablet (25 mg total) by mouth at bedtime as needed. Sleep and anxiety  Dispense: 90 tablet; Refill: 0   6. Morbid obesity, unspecified obesity type [facility])  Discussed with the patient the risk posed by an increased BMI. Discussed importance of portion control, calorie counting and at least 150 minutes of physical activity weekly. Avoid sweet beverages and drink more water. Eat at least 6 servings of fruit and vegetables daily

## 2017-07-27 ENCOUNTER — Ambulatory Visit
Admission: RE | Admit: 2017-07-27 | Discharge: 2017-07-27 | Disposition: A | Discharge status: Home / Self Care | Payer: 59 | Source: Ambulatory Visit | Attending: Family Medicine | Admitting: Family Medicine

## 2017-07-27 DIAGNOSIS — Z1231 Encounter for screening mammogram for malignant neoplasm of breast: Secondary | ICD-10-CM | POA: Diagnosis not present

## 2017-08-13 ENCOUNTER — Other Ambulatory Visit: Payer: Self-pay | Admitting: Family Medicine

## 2017-08-13 DIAGNOSIS — E038 Other specified hypothyroidism: Secondary | ICD-10-CM

## 2017-08-13 LAB — TSH: TSH: 2.79 mIU/L (ref 0.40–4.50)

## 2017-08-13 NOTE — Telephone Encounter (Signed)
Spoke with patient and she will come by today around 2pm to get lab work done. She stated that she took her last pill today and would like to know if you would refill it

## 2017-08-14 ENCOUNTER — Other Ambulatory Visit: Payer: Self-pay | Admitting: Family Medicine

## 2017-08-14 DIAGNOSIS — E038 Other specified hypothyroidism: Secondary | ICD-10-CM

## 2017-08-14 MED ORDER — LEVOTHYROXINE SODIUM 200 MCG PO TABS: 200.0000 ug | ORAL_TABLET | Freq: Every day | ORAL | 0 refills | Status: DC

## 2017-10-04 ENCOUNTER — Ambulatory Visit (INDEPENDENT_AMBULATORY_CARE_PROVIDER_SITE_OTHER): Payer: 59 | Admitting: Family Medicine

## 2017-10-04 ENCOUNTER — Encounter: Payer: Self-pay | Admitting: Family Medicine

## 2017-10-04 VITALS — BP 130/74 | HR 97 | Temp 97.9°F | Resp 16 | Ht 68.0 in | Wt 256.8 lb

## 2017-10-04 DIAGNOSIS — E785 Hyperlipidemia, unspecified: Secondary | ICD-10-CM

## 2017-10-04 DIAGNOSIS — E039 Hypothyroidism, unspecified: Secondary | ICD-10-CM | POA: Diagnosis not present

## 2017-10-04 DIAGNOSIS — K219 Gastro-esophageal reflux disease without esophagitis: Secondary | ICD-10-CM

## 2017-10-04 DIAGNOSIS — E538 Deficiency of other specified B group vitamins: Secondary | ICD-10-CM

## 2017-10-04 DIAGNOSIS — R739 Hyperglycemia, unspecified: Secondary | ICD-10-CM

## 2017-10-04 DIAGNOSIS — M797 Fibromyalgia: Secondary | ICD-10-CM

## 2017-10-04 DIAGNOSIS — Z862 Personal history of diseases of the blood and blood-forming organs and certain disorders involving the immune mechanism: Secondary | ICD-10-CM

## 2017-10-04 DIAGNOSIS — E559 Vitamin D deficiency, unspecified: Secondary | ICD-10-CM

## 2017-10-04 DIAGNOSIS — F339 Major depressive disorder, recurrent, unspecified: Secondary | ICD-10-CM | POA: Diagnosis not present

## 2017-10-04 MED ORDER — LEVOTHYROXINE SODIUM 200 MCG PO TABS: 200.0000 ug | ORAL_TABLET | Freq: Every day | ORAL | 0 refills | Status: DC

## 2017-10-04 MED ORDER — AMPHETAMINE-DEXTROAMPHET ER 15 MG PO CP24: 15.0000 mg | ORAL_CAPSULE | ORAL | 0 refills | Status: DC

## 2017-10-04 NOTE — Progress Notes (Signed)
Name: Debra Soto   MRN: [id number]    DOB: Sep 12, 1960   Date:10/04/2017       Progress Note  Subjective  Chief Complaint  Chief Complaint  Patient presents with  . Medication Refill    3 month F/U-Woud like 90 day prescriptions  . Depression    Only got 1 month of Adderall on last visit-not sure why.  . Hypothyroidism    Went on vacation and forgot to take them for 4 days-asked we check blood work later due to levels being off  . Fibromyalgia  . Insomnia  . Gastroesophageal Reflux    Does well with medication  . Vaginitis    Since being on antibiotics-would like Diflucan for symptom relief-has tried otc but not helping    HPI   Hypothyroidism: last TSH was back to normal, however she went to the beach for about 4 days and forgot to take medication with her. She has lost some weight. No change in bowel movements.   Major Depression: shewason Duloxetine but asked to go back onCitalopram and Wellbutrin08/2018, she states shehas beenunder more stress now, husband has a new job in [place] , they sold her house May 22 nd, they moved to the new house, she is under more stress because problems with the new houses. Adderal improved with energy level without side effects. Advised to call monthly for refills.   FMS:she stopped Lyrica because it causes her to get moody. She has trigger point positive   Insomnia: took Trazodone but does not want to go back at this time. Hydroxyzine also made her tired, she is taking melatonin now and states seems to help   Dyslipidemia: LDL is 140, seen by Dr. Rockey Situ, had a normal stress test, heart rate can go up intermittently/ We will recheck levels.   GERD: on Omeprazol and stable, no heartburn or indigestion with medication, she is aware of long term risk of PPI, she tried weaning self off but unable to tolerate. Unchanged   Morbid obesity: she has lost some weight since last visit, she is not sure the reason, but happy with results.    Laceration right arm: her dog scratched her this am. Tdap is up to date  Patient Active Problem List   Diagnosis Date Noted  . Special screening for malignant neoplasms, colon   . Benign neoplasm of descending colon   . Heartburn   . Gastric polyp   . Psoriasis 01/25/2016  . 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 Endometriosis 12/31/2014  . Insomnia, persistent 09/29/2014  . IBS (irritable bowel syndrome) 09/29/2014  . 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 Dyslipidemia 09/29/2014  . Fibromyalgia syndrome 09/29/2014  . Gastro-esophageal reflux disease without esophagitis 09/29/2014  . Herpes labialis 09/29/2014  . Adult hypothyroidism 09/29/2014  . Family history of malignant neoplasm of breast 09/29/2014  . Morbid obesity, unspecified obesity type ([place]) 09/29/2014  . Major depressive episode 09/29/2014  . Obstructive apnea 09/29/2014  . Abnormal presence of protein in urine 09/29/2014  . Allergic rhinitis 09/29/2014  . Vitamin D deficiency 09/29/2014  . Urge incontinence 09/29/2014  . Headache, tension-type 09/29/2014  . Menopausal symptom 09/29/2014    Past Surgical History:  Procedure Laterality Date  . ABDOMINAL HYSTERECTOMY  [id number]  . bladder tact    . BREAST BIOPSY Right 09/02/2015   US Guided biopsy - Ribbon shaped marker - negative  . CHOLECYSTECTOMY  [id number]  . COLONOSCOPY    . COLONOSCOPY WITH PROPOFOL N/A 02/10/2016   Procedure:  COLONOSCOPY WITH PROPOFOL;  Surgeon: Lucilla Lame, MD;  Location: [place];  Service: Endoscopy;  Laterality: N/A;  . ESOPHAGOGASTRODUODENOSCOPY    . ESOPHAGOGASTRODUODENOSCOPY (EGD) WITH PROPOFOL N/A 02/10/2016   Procedure: ESOPHAGOGASTRODUODENOSCOPY (EGD) WITH PROPOFOL;  Surgeon: Lucilla Lame, MD;  Location: [place];  Service: Endoscopy;  Laterality: N/A;  sleep apnea  . LIPOMA EXCISION    . POLYPECTOMY  02/10/2016   Procedure: POLYPECTOMY;  Surgeon: Lucilla Lame, MD;  Location: [place];   Service: Endoscopy;;  . TONSILECTOMY, ADENOIDECTOMY, BILATERAL MYRINGOTOMY AND TUBES      Family History  Problem Relation Age of Onset  . Hypertension Mother   . Arthritis Mother   . Cancer Mother        ovarian,breast, lung, liver  . Breast cancer Mother 105  . Ovarian cancer Mother 54  . Lung cancer Mother 43  . Diabetes Brother   . Hypertension Brother   . Other Father        lung problems  . Ovarian cancer Maternal Grandmother        pt thinks is was ovarian but not sure    Social History   Socioeconomic History  . Marital status: Married    Spouse name: Elenore Rota  . Number of children: 2  . Years of education: Not on file  . Highest education level: 12th grade  Occupational History  . Occupation: Psychologist, occupational as Chiropodist     Comment: learning [employer]  Social Needs  . Financial resource strain: Not hard at all  . Food insecurity:    Worry: Never true    Inability: Never true  . Transportation needs:    Medical: No    Non-medical: No  Tobacco Use  . Smoking status: Never Smoker  . Smokeless tobacco: Never Used  Substance and Sexual Activity  . Alcohol use: Yes    Alcohol/week: 0.0 oz    Comment: rarely  . Drug use: No  . Sexual activity: Yes  Lifestyle  . Physical activity:    Days per week: 0 days    Minutes per session: 0 min  . Stress: Very much  Relationships  . Social connections:    Talks on phone: Not on file    Gets together: Not on file    Attends religious service: Not on file    Active member of club or organization: Not on file    Attends meetings of clubs or organizations: Not on file    Relationship status: Not on file  . Intimate partner violence:    Fear of current or ex partner: No    Emotionally abused: No    Physically abused: No    Forced sexual activity: No  Other Topics Concern  . Not on file  Social History Narrative  . Not on file     Current Outpatient Medications:  .  betamethasone dipropionate (DIPROLENE)  0.05 % cream, apply A THIN LAYER to affected area twice a day UNTIL CLEAR, Disp: , Rfl: 0 .  buPROPion (WELLBUTRIN XL) 150 MG 24 hr tablet, TAKE 1 TABLET(150 MG) BY MOUTH DAILY, Disp: 90 tablet, Rfl: 1 .  Ca Phosphate-Cholecalciferol (CALCIUM/VITAMIN D3 GUMMIES) 200-200 MG-UNIT CHEW, Chew 1 each by mouth daily., Disp: , Rfl:  .  citalopram (CELEXA) 20 MG tablet, TAKE 1 TABLET(20 MG) BY MOUTH DAILY, Disp: 90 tablet, Rfl: 1 .  omeprazole (PRILOSEC) 20 MG capsule, take 1 capsule by mouth twice a day BEFORE A MEAL, Disp: 180 capsule, Rfl: 1 .  valACYclovir (VALTREX) 1000 MG tablet, Take 1 tablet (1,000 mg total) by mouth 2 (two) times daily as needed., Disp: 30 tablet, Rfl: 0 .  amphetamine-dextroamphetamine (ADDERALL XR) 15 MG 24 hr capsule, Take 1 capsule by mouth every morning., Disp: 30 capsule, Rfl: 0 .  hydrOXYzine (ATARAX/VISTARIL) 25 MG tablet, Take 1 tablet (25 mg total) by mouth at bedtime as needed. Sleep and anxiety (Patient not taking: Reported on 10/04/2017), Disp: 90 tablet, Rfl: 0 .  levothyroxine (SYNTHROID, LEVOTHROID) 200 MCG tablet, Take 1 tablet (200 mcg total) by mouth daily before breakfast., Disp: 90 tablet, Rfl: 0  No Known Allergies   ROS  Constitutional: Negative for fever , positive for mild weight change.  Respiratory: Negative for cough and shortness of breath.   Cardiovascular: Negative for chest pain or palpitations.  Gastrointestinal: Negative for abdominal pain, no bowel changes.  Musculoskeletal: Negative for gait problem or joint swelling.  Skin: Negative for rash. She has lacerations on arms from dog scratching her Neurological: Negative for dizziness or headache.  No other specific complaints in a complete review of systems (except as listed in HPI above).  Objective  Vitals:   10/04/17 1038  BP: 130/74  Pulse: 97  Resp: 16  Temp: 97.9 F (36.6 C)  TempSrc: Oral  SpO2: 98%  Weight: 256 lb 12.8 oz (116.5 kg)  Height: 5\' 8"  (1.727 m)    Body mass  index is 39.05 kg/m.  Physical Exam  Constitutional: Patient appears well-developed and well-nourished. Obese  No distress.  HEENT: head atraumatic, normocephalic, pupils equal and reactive to light,  neck supple, throat within normal limits No thyromegaly  Cardiovascular: Normal rate, regular rhythm and normal heart sounds.  No murmur heard. No BLE edema. Pulmonary/Chest: Effort normal and breath sounds normal. No respiratory distress. Abdominal: Soft.  There is no tenderness. Psychiatric: Patient has a normal mood and affect. behavior is normal. Judgment and thought content normal. Muscular Skeletal: trigger points positives    Recent Results (from the past 2160 hour(s))  TSH     Status: None   Collection Time: 08/13/17  2:08 PM  Result Value Ref Range   TSH 2.79 0.40 - 4.50 mIU/L     PHQ2/9: Depression screen [facility] 2/9 10/04/2017 07/06/2017 12/18/2016 06/29/2016 01/25/2016  Decreased Interest 2 0 0 2 0  Down, Depressed, Hopeless 1 1 0 - 0  PHQ - 2 Score 3 1 0 2 0  Altered sleeping 3 2 - 3 -  Tired, decreased energy 3 2 - - -  Change in appetite 3 2 - (No Data) -  Feeling bad or failure about yourself  1 1 - 0 -  Trouble concentrating 3 3 - 0 -  Moving slowly or fidgety/restless 0 1 - 0 -  Suicidal thoughts 0 0 - 0 -  PHQ-9 Score 16 12 - 5 -  Difficult doing work/chores Very difficult Somewhat difficult - Not difficult at all -     Fall Risk: Fall Risk  10/04/2017 07/06/2017 12/18/2016 06/29/2016 01/25/2016  Falls in the past year? No No Yes No No  Number falls in past yr: - - 1 - -  Injury with Fall? - - No - -     Functional Status Survey: Is the patient deaf or have difficulty hearing?: Yes(Background Noise will make certain things hard hearing with it) Does the patient have difficulty seeing, even when wearing glasses/contacts?: No Does the patient have difficulty concentrating, remembering, or making decisions?: Yes(Fibromyalgia Fog) Does the patient  have difficulty  walking or climbing stairs?: No Does the patient have difficulty dressing or bathing?: No Does the patient have difficulty doing errands alone such as visiting a doctor's office or shopping?: No   Assessment & Plan   1. Major depression, recurrent, chronic (HCC)  - amphetamine-dextroamphetamine (ADDERALL XR) 15 MG 24 hr capsule; Take 1 capsule by mouth every morning.  Dispense: 30 capsule; Refill: 0  2. Gastro-esophageal reflux disease without esophagitis   3. Adult hypothyroidism  She has been out of medication for four days because she went to beach  - TSH  4. Vitamin D deficiency  - levothyroxine (SYNTHROID, LEVOTHROID) 200 MCG tablet; Take 1 tablet (200 mcg total) by mouth daily before breakfast.  Dispense: 90 tablet; Refill: 0 - VITAMIN D 25 Hydroxy (Vit-D Deficiency, Fractures)  5. Hyperglycemia  - Hemoglobin A1c  6. Dyslipidemia  - Lipid panel - COMPLETE METABOLIC PANEL WITH GFR  7. Fibromyalgia syndrome  stable  8. Morbid obesity, unspecified obesity type [facility])  Discussed with the patient the risk posed by an increased BMI. Discussed importance of portion control, calorie counting and at least 150 minutes of physical activity weekly. Avoid sweet beverages and drink more water. Eat at least 6 servings of fruit and vegetables daily   9. History of anemia  - CBC with Differential/Platelet  10. B12 deficiency  - Vitamin B12

## 2017-10-05 LAB — COMPLETE METABOLIC PANEL WITH GFR
AG Ratio: 1.3 (calc) (ref 1.0–2.5)
ALBUMIN MSPROF: 3.9 g/dL (ref 3.6–5.1)
ALKALINE PHOSPHATASE (APISO): 113 U/L (ref 33–130)
ALT: 10 U/L (ref 6–29)
AST: 12 U/L (ref 10–35)
BUN: 10 mg/dL (ref 7–25)
CO2: 28 mmol/L (ref 20–32)
CREATININE: 1.05 mg/dL (ref 0.50–1.05)
Calcium: 9.1 mg/dL (ref 8.6–10.4)
Chloride: 103 mmol/L (ref 98–110)
GFR, Est African American: 68 mL/min/{1.73_m2} (ref >=60)
GFR, Est Non African American: 59 mL/min/{1.73_m2} — ABNORMAL LOW (ref >=60)
GLUCOSE: 81 mg/dL (ref 65–139)
Globulin: 3.1 g/dL (calc) (ref 1.9–3.7)
Potassium: 4.1 mmol/L (ref 3.5–5.3)
Sodium: 138 mmol/L (ref 135–146)
TOTAL PROTEIN: 7 g/dL (ref 6.1–8.1)
Total Bilirubin: 0.3 mg/dL (ref 0.2–1.2)

## 2017-10-05 LAB — CBC WITH DIFFERENTIAL/PLATELET
BASOS PCT: 0.9 %
Basophils Absolute: 72 cells/uL (ref 0–200)
EOS PCT: 0.5 %
Eosinophils Absolute: 40 cells/uL (ref 15–500)
HCT: 28.5 % — ABNORMAL LOW (ref 35.0–45.0)
HEMOGLOBIN: 8.6 g/dL — AB (ref 11.7–15.5)
Lymphs Abs: 2416 cells/uL (ref 850–3900)
MCH: 20.9 pg — ABNORMAL LOW (ref 27.0–33.0)
MCHC: 30.2 g/dL — AB (ref 32.0–36.0)
MCV: 69.2 fL — ABNORMAL LOW (ref 80.0–100.0)
MONOS PCT: 5.5 %
MPV: 10.5 fL (ref 7.5–12.5)
NEUTROS ABS: 5032 {cells}/uL (ref 1500–7800)
Neutrophils Relative %: 62.9 %
PLATELETS: 408 10*3/uL — AB (ref 140–400)
RBC: 4.12 10*6/uL (ref 3.80–5.10)
RDW: 16.1 % — ABNORMAL HIGH (ref 11.0–15.0)
Total Lymphocyte: 30.2 %
WBC mixed population: 440 cells/uL (ref 200–950)
WBC: 8 10*3/uL (ref 3.8–10.8)

## 2017-10-05 LAB — LIPID PANEL
CHOL/HDL RATIO: 4.3 (calc) (ref <5.0)
Cholesterol: 195 mg/dL (ref <200)
HDL: 45 mg/dL — AB (ref >50)
LDL Cholesterol (Calc): 122 mg/dL (calc) — ABNORMAL HIGH
NON-HDL CHOLESTEROL (CALC): 150 mg/dL — AB (ref <130)
TRIGLYCERIDES: 162 mg/dL — AB (ref <150)

## 2017-10-05 LAB — HEMOGLOBIN A1C
Hgb A1c MFr Bld: 5.6 % of total Hgb (ref <5.7)
MEAN PLASMA GLUCOSE: 114 (calc)
eAG (mmol/L): 6.3 (calc)

## 2017-10-05 LAB — VITAMIN B12: Vitamin B-12: 431 pg/mL (ref 200–1100)

## 2017-10-05 LAB — VITAMIN D 25 HYDROXY (VIT D DEFICIENCY, FRACTURES): VIT D 25 HYDROXY: 28 ng/mL — AB (ref 30–100)

## 2017-10-05 LAB — CBC MORPHOLOGY

## 2017-10-05 LAB — TSH: TSH: 12.44 m[IU]/L — AB (ref 0.40–4.50)

## 2017-10-07 ENCOUNTER — Other Ambulatory Visit: Payer: Self-pay | Admitting: Family Medicine

## 2017-10-07 DIAGNOSIS — D473 Essential (hemorrhagic) thrombocythemia: Secondary | ICD-10-CM

## 2017-10-07 DIAGNOSIS — D75839 Thrombocytosis, unspecified: Secondary | ICD-10-CM

## 2017-10-07 DIAGNOSIS — D649 Anemia, unspecified: Secondary | ICD-10-CM

## 2017-10-10 ENCOUNTER — Telehealth: Payer: Self-pay | Admitting: Internal Medicine

## 2017-10-10 ENCOUNTER — Encounter: Payer: Self-pay | Admitting: Internal Medicine

## 2017-10-10 NOTE — Telephone Encounter (Signed)
New referral received from Dr. Ancil Boozer for the pt to see a hematologist. Pt has been scheduled to see Dr. Julien Nordmann on 8/20 at 1130am. Pt aware arrive 30 minutes early. Letter mailed.

## 2017-10-15 ENCOUNTER — Telehealth: Payer: Self-pay | Admitting: Internal Medicine

## 2017-10-15 NOTE — Telephone Encounter (Signed)
Lft the pt a vm to reschedule appt °

## 2017-10-17 ENCOUNTER — Encounter: Payer: Self-pay | Admitting: Internal Medicine

## 2017-10-23 ENCOUNTER — Encounter: Payer: 59 | Admitting: Family Medicine

## 2017-10-30 ENCOUNTER — Encounter: Payer: 59 | Admitting: Internal Medicine

## 2017-11-06 ENCOUNTER — Telehealth: Payer: Self-pay | Admitting: Internal Medicine

## 2017-11-06 ENCOUNTER — Encounter: Payer: Self-pay | Admitting: Internal Medicine

## 2017-11-06 ENCOUNTER — Inpatient Hospital Stay: Payer: 59 | Attending: Internal Medicine | Admitting: Internal Medicine

## 2017-11-06 ENCOUNTER — Inpatient Hospital Stay: Payer: 59

## 2017-11-06 VITALS — BP 126/79 | HR 88 | Temp 98.3°F | Resp 18 | Ht 68.0 in | Wt 254.4 lb

## 2017-11-06 DIAGNOSIS — D509 Iron deficiency anemia, unspecified: Secondary | ICD-10-CM | POA: Diagnosis not present

## 2017-11-06 DIAGNOSIS — E559 Vitamin D deficiency, unspecified: Secondary | ICD-10-CM

## 2017-11-06 DIAGNOSIS — D508 Other iron deficiency anemias: Secondary | ICD-10-CM | POA: Insufficient documentation

## 2017-11-06 DIAGNOSIS — M797 Fibromyalgia: Secondary | ICD-10-CM

## 2017-11-06 DIAGNOSIS — E785 Hyperlipidemia, unspecified: Secondary | ICD-10-CM

## 2017-11-06 DIAGNOSIS — F329 Major depressive disorder, single episode, unspecified: Secondary | ICD-10-CM

## 2017-11-06 DIAGNOSIS — K219 Gastro-esophageal reflux disease without esophagitis: Secondary | ICD-10-CM | POA: Diagnosis not present

## 2017-11-06 LAB — CBC WITH DIFFERENTIAL (CANCER CENTER ONLY)
BASOS ABS: 0 10*3/uL (ref 0.0–0.1)
Basophils Relative: 1 %
Eosinophils Absolute: 0 10*3/uL (ref 0.0–0.5)
Eosinophils Relative: 0 %
HEMATOCRIT: 30.5 % — AB (ref 34.8–46.6)
Hemoglobin: 8.8 g/dL — ABNORMAL LOW (ref 11.6–15.9)
LYMPHS ABS: 1.8 10*3/uL (ref 0.9–3.3)
Lymphocytes Relative: 28 %
MCH: 20.9 pg — AB (ref 25.1–34.0)
MCHC: 28.9 g/dL — ABNORMAL LOW (ref 31.5–36.0)
MCV: 72.3 fL — AB (ref 79.5–101.0)
MONO ABS: 0.4 10*3/uL (ref 0.1–0.9)
Monocytes Relative: 6 %
NEUTROS ABS: 4.1 10*3/uL (ref 1.5–6.5)
Neutrophils Relative %: 65 %
Platelet Count: 359 10*3/uL (ref 145–400)
RBC: 4.22 MIL/uL (ref 3.70–5.45)
RDW: 17.8 % — AB (ref 11.2–14.5)
WBC Count: 6.3 10*3/uL (ref 3.9–10.3)

## 2017-11-06 LAB — CMP (CANCER CENTER ONLY)
ALBUMIN: 3.3 g/dL — AB (ref 3.5–5.0)
ALT: 12 U/L (ref 0–44)
ANION GAP: 7 (ref 5–15)
AST: 13 U/L — AB (ref 15–41)
Alkaline Phosphatase: 123 U/L (ref 38–126)
BUN: 11 mg/dL (ref 6–20)
CO2: 29 mmol/L (ref 22–32)
Calcium: 9.2 mg/dL (ref 8.9–10.3)
Chloride: 105 mmol/L (ref 98–111)
Creatinine: 0.99 mg/dL (ref 0.44–1.00)
GFR, Est AFR Am: >60 mL/min (ref >60)
GFR, Estimated: >60 mL/min (ref >60)
GLUCOSE: 86 mg/dL (ref 70–99)
Potassium: 4 mmol/L (ref 3.5–5.1)
SODIUM: 141 mmol/L (ref 135–145)
Total Bilirubin: 0.3 mg/dL (ref 0.3–1.2)
Total Protein: 7.7 g/dL (ref 6.5–8.1)

## 2017-11-06 LAB — FOLATE: Folate: 12.7 ng/mL (ref >5.9)

## 2017-11-06 LAB — IRON AND TIBC
Iron: 16 ug/dL — ABNORMAL LOW (ref 41–142)
SATURATION RATIOS: 4 % — AB (ref 21–57)
TIBC: 366 ug/dL (ref 236–444)
UIBC: 350 ug/dL

## 2017-11-06 LAB — VITAMIN B12: Vitamin B-12: 262 pg/mL (ref 180–914)

## 2017-11-06 LAB — FERRITIN: Ferritin: <4 ng/mL — ABNORMAL LOW (ref 11–307)

## 2017-11-06 NOTE — Telephone Encounter (Signed)
Appts scheduled AVS/Calendar printed/ I have tried calling [facility] feraheme infusion on 8/30. Unable to reach them .  I will continue to call to set up att per 8/27 los

## 2017-11-06 NOTE — Telephone Encounter (Signed)
Appt scheduled for 8/30 and pt notified per 8/27 los

## 2017-11-06 NOTE — Progress Notes (Signed)
[facility] [phone]   [phone]  CONSULT NOTE  REFERRING PHYSICIAN: Dr. Steele Sizer   REASON FOR CONSULTATION:  57 years old white female with persistent anemia.  HPI Georgene Kopper is a 57 y.o. female with past medical history significant for psoriasis, depression, fibromyalgia, dyslipidemia, sleep apnea, GERD, vitamin D deficiency as well as allergy.  The patient was seen by her primary care physician for evaluation of fibromyalgia and thyroid disorder.  During her evaluation she had CBC performed on 10/04/2017 and that showed hemoglobin was down to 8.6 328.5% with MCV of 69.2.'s were slightly elevated at 408,000.  Previous blood work a year before showed hemoglobin was also low at 10.8 and hematocrit 35.2.  The patient is not taking any oral iron supplements.  She had a colonoscopy performed last year that was unremarkable.  She also has upper endoscopy that showed hiatal hernia and evidence of gastritis secondary to treatment with NSAIDs.  She denied having any current bleeding issues.  She has no nausea or gum bleed.  She denied having any bruises or ecchymosis.  The patient denied having any hematuria or melena. When seen today she continues to complain of fatigue as well as occasional dizzy spells with the change of position.  She also has shortness of breath with exertion but no significant chest pain, cough or hemoptysis.  She denied having any recent weight loss or night sweats.  She has no headache or visual changes. Family history significant for mother with breast and ovarian cancer at age 33, maternal grandmother had ovarian cancer, brother had prostate cancer, father had COPD. Patient is married and has 2 children.  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  She has no history for smoking and she drinks alcohol occasionally and no history of drug abuse.  HPI  Past Medical History:  Diagnosis Date  . Allergy   .  Arthritis   . Depression   . Erythrodermic psoriasis   . Fibromyalgia   . GERD (gastroesophageal reflux disease)   . Hyperlipemia   . Insomnia   . Obesity   . Recurrent HSV (herpes simplex virus)   . Sleep apnea    pt states does not use CPAP  . Thyroid disease   . Vitamin D deficiency     Past Surgical History:  Procedure Laterality Date  . ABDOMINAL HYSTERECTOMY  [id number]  . bladder tact    . BREAST BIOPSY Right 09/02/2015   US Guided biopsy - Ribbon shaped marker - negative  . CHOLECYSTECTOMY  [id number]  . COLONOSCOPY    . COLONOSCOPY WITH PROPOFOL N/A 02/10/2016   Procedure: COLONOSCOPY WITH PROPOFOL;  Surgeon: Lucilla Lame, MD;  Location: [facility];  Service: Endoscopy;  Laterality: N/A;  . ESOPHAGOGASTRODUODENOSCOPY    . ESOPHAGOGASTRODUODENOSCOPY (EGD) WITH PROPOFOL N/A 02/10/2016   Procedure: ESOPHAGOGASTRODUODENOSCOPY (EGD) WITH PROPOFOL;  Surgeon: Lucilla Lame, MD;  Location: [facility];  Service: Endoscopy;  Laterality: N/A;  sleep apnea  . LIPOMA EXCISION    . POLYPECTOMY  02/10/2016   Procedure: POLYPECTOMY;  Surgeon: Lucilla Lame, MD;  Location: [facility];  Service: Endoscopy;;  . TONSILECTOMY, ADENOIDECTOMY, BILATERAL MYRINGOTOMY AND TUBES      Family History  Problem Relation Age of Onset  . Hypertension Mother   . Arthritis Mother   . Cancer Mother        ovarian,breast, lung, liver  . Breast cancer Mother 33  . Ovarian cancer Mother 78  .  Lung cancer Mother 36  . Diabetes Brother   . Hypertension Brother   . Other Father        lung problems  . Ovarian cancer Maternal Grandmother        pt thinks is was ovarian but not sure    Social History Social History   Tobacco Use  . Smoking status: Never Smoker  . Smokeless tobacco: Never Used  Substance Use Topics  . Alcohol use: Yes    Alcohol/week: 0.0 standard drinks    Comment: rarely  . Drug use: No    No Known Allergies  Current Outpatient Medications    Medication Sig Dispense Refill  . amphetamine-dextroamphetamine (ADDERALL XR) 15 MG 24 hr capsule Take 1 capsule by mouth every morning. 30 capsule 0  . betamethasone dipropionate (DIPROLENE) 0.05 % cream apply A THIN LAYER to affected area twice a day UNTIL CLEAR  0  . buPROPion (WELLBUTRIN XL) 150 MG 24 hr tablet TAKE 1 TABLET(150 MG) BY MOUTH DAILY 90 tablet 1  . Ca Phosphate-Cholecalciferol (CALCIUM/VITAMIN D3 GUMMIES) 200-200 MG-UNIT CHEW Chew 1 each by mouth daily.    . citalopram (CELEXA) 20 MG tablet TAKE 1 TABLET(20 MG) BY MOUTH DAILY 90 tablet 1  . hydrOXYzine (ATARAX/VISTARIL) 25 MG tablet Take 1 tablet (25 mg total) by mouth at bedtime as needed. Sleep and anxiety (Patient not taking: Reported on 10/04/2017) 90 tablet 0  . levothyroxine (SYNTHROID, LEVOTHROID) 200 MCG tablet Take 1 tablet (200 mcg total) by mouth daily before breakfast. 90 tablet 0  . omeprazole (PRILOSEC) 20 MG capsule take 1 capsule by mouth twice a day BEFORE A MEAL 180 capsule 1  . valACYclovir (VALTREX) 1000 MG tablet Take 1 tablet (1,000 mg total) by mouth 2 (two) times daily as needed. 30 tablet 0   No current facility-administered medications for this visit.     Review of Systems  Constitutional: positive for fatigue Eyes: negative Ears, nose, mouth, throat, and face: negative Respiratory: positive for dyspnea on exertion Cardiovascular: negative Gastrointestinal: negative Genitourinary:negative Integument/breast: negative Hematologic/lymphatic: negative Musculoskeletal:negative Neurological: positive for dizziness Behavioral/Psych: negative Endocrine: negative Allergic/Immunologic: negative  Physical Exam  KPT:WSFKC, healthy, no distress, well nourished and well developed SKIN: skin color, texture, turgor are normal, no rashes or significant lesions HEAD: Normocephalic, No masses, lesions, tenderness or abnormalities EYES: normal, PERRLA, Conjunctiva are pink and non-injected EARS: External  ears normal, Canals clear OROPHARYNX:no exudate, no erythema and lips, buccal mucosa, and tongue normal  NECK: supple, no adenopathy, no JVD LYMPH:  no palpable lymphadenopathy, no hepatosplenomegaly BREAST:not examined LUNGS: clear to auscultation , and palpation HEART: regular rate & rhythm, no murmurs and no gallops ABDOMEN:abdomen soft, non-tender, normal bowel sounds and no masses or organomegaly BACK: Back symmetric, no curvature., No CVA tenderness EXTREMITIES:no joint deformities, effusion, or inflammation, no edema  NEURO: alert & oriented x 3 with fluent speech, no focal motor/sensory deficits  PERFORMANCE STATUS: ECOG 1  LABORATORY DATA: Lab Results  Component Value Date   WBC 8.0 10/04/2017   HGB 8.6 (L) 10/04/2017   HCT 28.5 (L) 10/04/2017   MCV 69.2 (L) 10/04/2017   PLT 408 (H) 10/04/2017      Chemistry      Component Value Date/Time   NA 138 10/04/2017 1141   NA 142 07/22/2015 0943   NA 138 08/07/2013 0514   K 4.1 10/04/2017 1141   K 4.8 08/07/2013 0514   CL 103 10/04/2017 1141   CL 105 08/07/2013 0514   CO2  28 10/04/2017 1141   CO2 30 08/07/2013 0514   BUN 10 10/04/2017 1141   BUN 14 07/22/2015 0943   BUN 9 08/07/2013 0514   CREATININE 1.05 10/04/2017 1141      Component Value Date/Time   CALCIUM 9.1 10/04/2017 1141   CALCIUM 9.1 08/07/2013 0514   ALKPHOS 117 06/29/2016 1101   ALKPHOS 137 (H) 08/07/2013 0514   AST 12 10/04/2017 1141   AST 35 08/07/2013 0514   ALT 10 10/04/2017 1141   ALT 35 08/07/2013 0514   BILITOT 0.3 10/04/2017 1141   BILITOT <0.2 07/22/2015 0943   BILITOT 0.2 08/07/2013 0514       RADIOGRAPHIC STUDIES: No results found.  ASSESSMENT: She is a very pleasant 57 years old white female with persistent microcytic anemia likely iron deficiency anemia secondary to inadequate dietary intake and probably minor GI bleed from her gastritis.   PLAN: I had a lengthy discussion with the patient today about her condition, further  investigation and treatment options. I order several studies today for evaluation of her anemia including repeat CBC, complaints metabolic panel, iron study, ferritin, serum folate, vitamin B12 as well as serum protein electrophoresis with immune fixation. Her CBC and iron study came consistent with severe iron deficiency anemia. I will arrange for the patient to receive Feraheme infusion 510 mg IV weekly for 2 doses.  She is expected to receive the first infusion on November 09, 2017. We will see her back for follow-up visit in 6 weeks for evaluation with repeat CBC, iron study and ferritin. The patient was advised to call immediately if she has any concerning symptoms in the interval. The patient voices understanding of current disease status and treatment options and is in agreement with the current care plan.  All questions were answered. The patient knows to call the clinic with any problems, questions or concerns. We can certainly see the patient much sooner if necessary.  Thank you so much for allowing me to participate in the care of [facility]. I will continue to follow up the patient with you and assist in her care.  I spent 40 minutes counseling the patient face to face. The total time spent in the appointment was 60 minutes.  Disclaimer: This note was dictated with voice recognition software. Similar sounding words can inadvertently be transcribed and may not be corrected upon review.   Eilleen Kempf November 06, 2017, 12:00 PM

## 2017-11-07 LAB — PROTEIN ELECTROPHORESIS, SERUM, WITH REFLEX
A/G Ratio: 0.9 (ref 0.7–1.7)
ALPHA-1-GLOBULIN: 0.3 g/dL (ref 0.0–0.4)
Albumin ELP: 3.4 g/dL (ref 2.9–4.4)
Alpha-2-Globulin: 0.7 g/dL (ref 0.4–1.0)
Beta Globulin: 1.3 g/dL (ref 0.7–1.3)
GAMMA GLOBULIN: 1.4 g/dL (ref 0.4–1.8)
GLOBULIN, TOTAL: 3.7 g/dL (ref 2.2–3.9)
TOTAL PROTEIN ELP: 7.1 g/dL (ref 6.0–8.5)

## 2017-11-09 ENCOUNTER — Ambulatory Visit (HOSPITAL_COMMUNITY)
Admission: RE | Admit: 2017-11-09 | Discharge: 2017-11-09 | Disposition: A | Discharge status: Home / Self Care | Payer: 59 | Source: Ambulatory Visit | Attending: Internal Medicine | Admitting: Internal Medicine

## 2017-11-09 DIAGNOSIS — D509 Iron deficiency anemia, unspecified: Secondary | ICD-10-CM | POA: Insufficient documentation

## 2017-11-09 MED ORDER — SODIUM CHLORIDE 0.9 % IV SOLN
510.0000 mg | Freq: Once | INTRAVENOUS | Status: AC
  Administered 2017-11-09: 510 mg via INTRAVENOUS
  Filled 2017-11-09: qty 17

## 2017-11-09 MED ORDER — SODIUM CHLORIDE 0.9 % IV SOLN
Freq: Once | INTRAVENOUS | Status: AC
  Administered 2017-11-09: 10 mL/h via INTRAVENOUS

## 2017-11-09 NOTE — Discharge Instructions (Signed)

## 2017-11-09 NOTE — Progress Notes (Signed)
PATIENT CARE CENTER NOTE  Diagnosis: Iron Deficiency Anemia    Provider: Dr. Julien Nordmann   Procedure: IV Feraheme    Note: Patient received Feraheme infusion. Tolerated infusion well with no adverse reaction. Monitored patient for 30 minutes post-infusion. Vital signs stable and discharge instructions given to patient. Patient alert, oriented and ambulatory at discharge.

## 2017-11-10 IMAGING — CR DG CHEST 2V
2 series · 2 of 2 positions shown · non-contrast
Comparison: None.

CLINICAL DATA: Chest pain with nausea and vomiting for 2 days.

EXAM:
CHEST  2 VIEW

[chest pa]
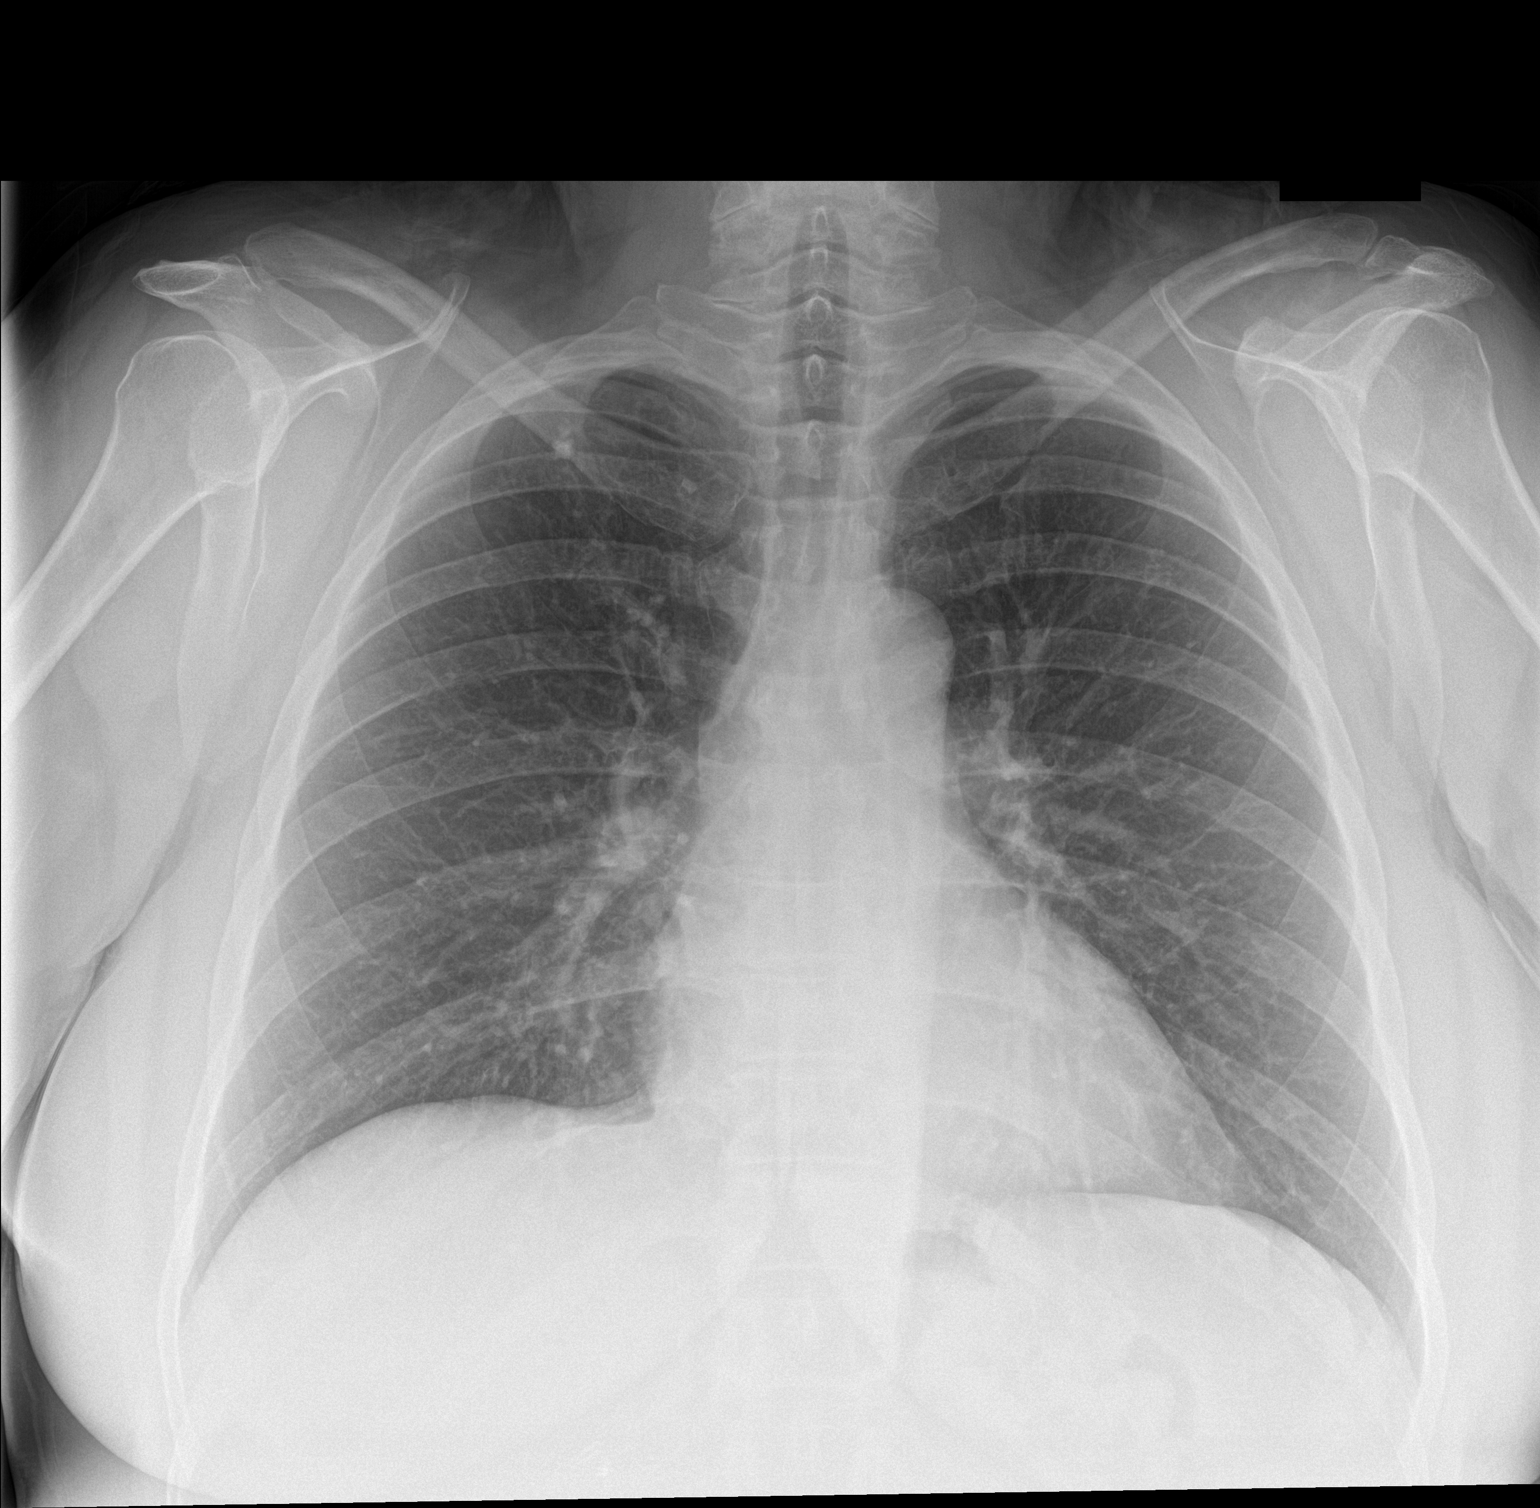

[chest lat]
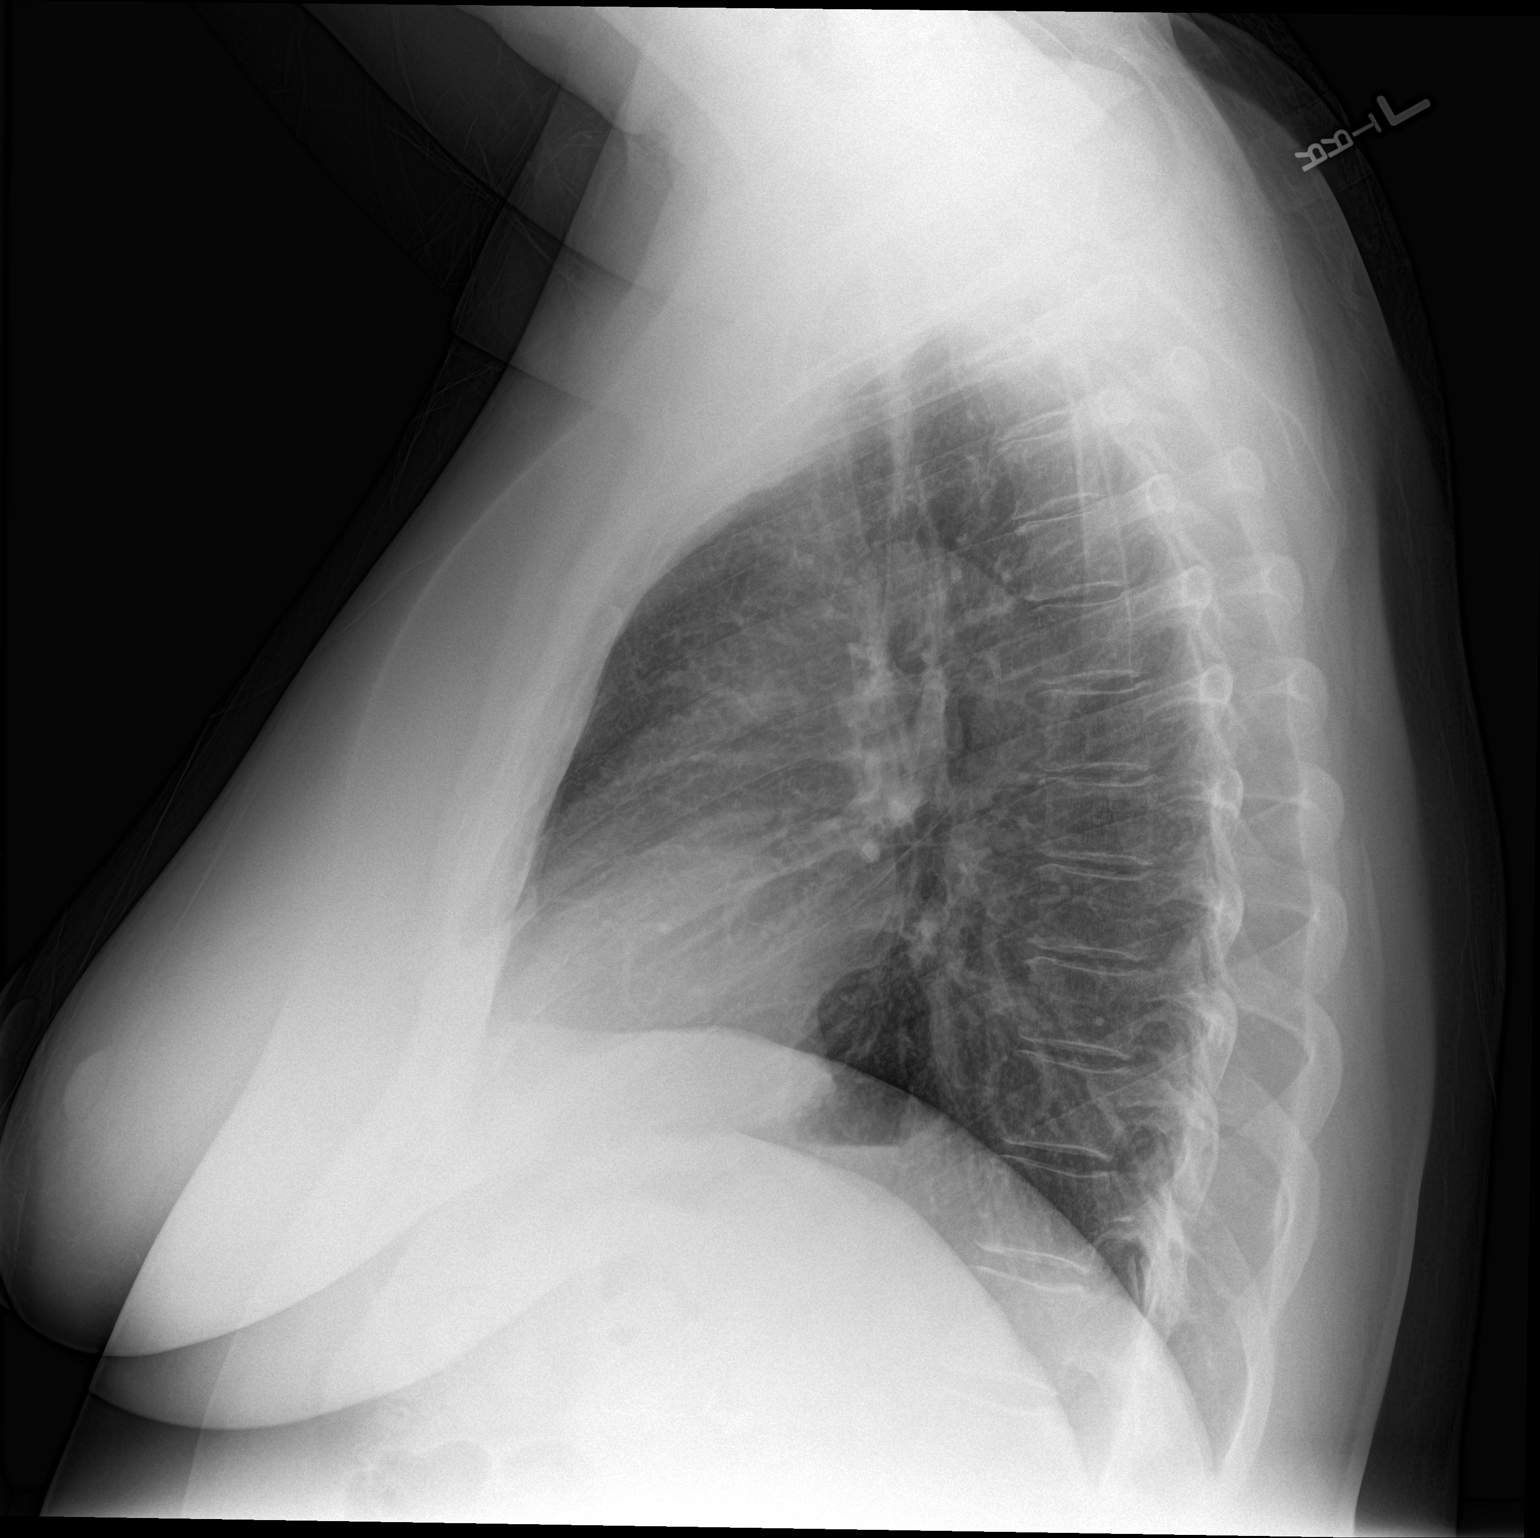

[2 of 2 positions shown; findings below may reference images not displayed]

FINDINGS: Normal heart size and pulmonary vascularity. No focal airspace
disease or consolidation in the lungs. No blunting of costophrenic
angles. No pneumothorax. Mediastinal contours appear intact.
Calcified granulomas in the left apex. Esophageal hiatal hernia
behind the heart.
IMPRESSION: No evidence of active pulmonary disease.  Esophageal hiatal hernia.

## 2017-11-16 ENCOUNTER — Inpatient Hospital Stay: Payer: 59

## 2017-11-16 ENCOUNTER — Inpatient Hospital Stay: Payer: 59 | Attending: Internal Medicine

## 2017-11-16 VITALS — BP 137/78 | HR 81 | Temp 98.4°F | Resp 14

## 2017-11-16 DIAGNOSIS — D509 Iron deficiency anemia, unspecified: Secondary | ICD-10-CM | POA: Diagnosis present

## 2017-11-16 MED ORDER — SODIUM CHLORIDE 0.9 % IV SOLN
Freq: Once | INTRAVENOUS | Status: AC
  Administered 2017-11-16: 14:00:00 via INTRAVENOUS
  Filled 2017-11-16: qty 250

## 2017-11-16 MED ORDER — SODIUM CHLORIDE 0.9 % IV SOLN
510.0000 mg | Freq: Once | INTRAVENOUS | Status: AC
  Administered 2017-11-16: 510 mg via INTRAVENOUS
  Filled 2017-11-16: qty 17

## 2017-11-16 NOTE — Patient Instructions (Signed)

## 2017-11-27 ENCOUNTER — Other Ambulatory Visit: Payer: Self-pay | Admitting: Family Medicine

## 2017-11-27 ENCOUNTER — Encounter: Payer: 59 | Admitting: Internal Medicine

## 2017-11-27 DIAGNOSIS — B001 Herpesviral vesicular dermatitis: Secondary | ICD-10-CM

## 2017-12-03 ENCOUNTER — Telehealth: Payer: Self-pay | Admitting: Family Medicine

## 2017-12-03 NOTE — Telephone Encounter (Signed)
Copied from [place] [id number]. Topic: Quick Communication - See Telephone Encounter >> Dec 03, 2017  3:24 PM Rutherford Nail, [place] wrote: CRM for notification. See Telephone encounter for: 12/03/17. Patient calling and states that she was diagnosed with anemia and states that she has had 2 infusions and is still very tired. States that she could sleep all day. Would like a call to discuss CB#: [phone]

## 2017-12-03 NOTE — Telephone Encounter (Signed)
I am surprised her hgb is still low, her last TSH was also elevated, can she have TSH repeated. Also saw low B12 level. Is she getting B12 injections at hematologist?  Can she come in sooner for follow up with me?

## 2017-12-04 NOTE — Telephone Encounter (Signed)
Patient state she has a CPE on this upcoming Tuesday 12/11/2017 and is just completely wiped out. She has not had a B12 at the Hematologist and states she has been taking out her Thyroid medication daily since her appt. She is getting very worried due to her excessive exhaustion she is driving her brother to work every day since his truck broke and states she will have to pull over and take a 2-3 hour nap at rest stops. Also she is sleeping all throughout the night and still having to take naps throughout the day.

## 2017-12-04 NOTE — Telephone Encounter (Signed)
We can discuss it during her next visit, needs to find someone else to drive her brother in the mean time

## 2017-12-10 ENCOUNTER — Telehealth: Payer: Self-pay | Admitting: Medical Oncology

## 2017-12-10 NOTE — Telephone Encounter (Signed)
faxed clearance note and labs.

## 2017-12-11 ENCOUNTER — Encounter: Payer: Self-pay | Admitting: Family Medicine

## 2017-12-11 ENCOUNTER — Ambulatory Visit (INDEPENDENT_AMBULATORY_CARE_PROVIDER_SITE_OTHER): Payer: 59 | Admitting: Family Medicine

## 2017-12-11 VITALS — BP 122/74 | HR 91 | Temp 98.0°F | Resp 16 | Ht 69.0 in | Wt 253.0 lb

## 2017-12-11 DIAGNOSIS — M797 Fibromyalgia: Secondary | ICD-10-CM

## 2017-12-11 DIAGNOSIS — Z1159 Encounter for screening for other viral diseases: Secondary | ICD-10-CM

## 2017-12-11 DIAGNOSIS — Z23 Encounter for immunization: Secondary | ICD-10-CM | POA: Diagnosis not present

## 2017-12-11 DIAGNOSIS — Z800 Family history of malignant neoplasm of digestive organs: Secondary | ICD-10-CM

## 2017-12-11 DIAGNOSIS — K219 Gastro-esophageal reflux disease without esophagitis: Secondary | ICD-10-CM

## 2017-12-11 DIAGNOSIS — R198 Other specified symptoms and signs involving the digestive system and abdomen: Secondary | ICD-10-CM

## 2017-12-11 DIAGNOSIS — F339 Major depressive disorder, recurrent, unspecified: Secondary | ICD-10-CM | POA: Diagnosis not present

## 2017-12-11 DIAGNOSIS — E538 Deficiency of other specified B group vitamins: Secondary | ICD-10-CM | POA: Diagnosis not present

## 2017-12-11 DIAGNOSIS — Z01419 Encounter for gynecological examination (general) (routine) without abnormal findings: Secondary | ICD-10-CM | POA: Diagnosis not present

## 2017-12-11 DIAGNOSIS — E559 Vitamin D deficiency, unspecified: Secondary | ICD-10-CM

## 2017-12-11 DIAGNOSIS — E039 Hypothyroidism, unspecified: Secondary | ICD-10-CM

## 2017-12-11 DIAGNOSIS — Z803 Family history of malignant neoplasm of breast: Secondary | ICD-10-CM

## 2017-12-11 DIAGNOSIS — Z8041 Family history of malignant neoplasm of ovary: Secondary | ICD-10-CM

## 2017-12-11 DIAGNOSIS — M2550 Pain in unspecified joint: Secondary | ICD-10-CM

## 2017-12-11 MED ORDER — ACETAMINOPHEN 500 MG PO TABS: 500.0000 mg | ORAL_TABLET | Freq: Four times a day (QID) | ORAL | 0 refills | Status: DC | PRN

## 2017-12-11 MED ORDER — AMPHETAMINE-DEXTROAMPHET ER 15 MG PO CP24: 15.0000 mg | ORAL_CAPSULE | ORAL | 0 refills | Status: DC

## 2017-12-11 MED ORDER — OMEPRAZOLE 20 MG PO CPDR: DELAYED_RELEASE_CAPSULE | ORAL | 1 refills | Status: DC

## 2017-12-11 MED ORDER — CYANOCOBALAMIN 1000 MCG/ML IJ SOLN
1000.0000 ug | Freq: Once | INTRAMUSCULAR | Status: AC
  Administered 2017-12-11: 1000 ug via INTRAMUSCULAR

## 2017-12-11 MED ORDER — CITALOPRAM HYDROBROMIDE 20 MG PO TABS: ORAL_TABLET | ORAL | 1 refills | Status: DC

## 2017-12-11 MED ORDER — BUPROPION HCL ER (XL) 150 MG PO TB24: ORAL_TABLET | ORAL | 1 refills | Status: DC

## 2017-12-11 NOTE — Progress Notes (Signed)
Name: Debra Soto   MRN: [id number]    DOB: [birth year]   Date:12/11/2017       Progress Note  Subjective  Chief Complaint  Chief Complaint  Patient presents with  . Annual Exam    HPI   Patient presents for annual CPE and follow up  Hypothyroidism: last TSH was elevated again. She states she skips it at times, explained importance of compliance.   Major Depression: shewason Duloxetine but asked to go back onCitalopram and Wellbutrin08/2018. They moved from [place] to [facility] for husbands new job. Her brother moved here from [place] and is living with them, but she needs to drive him to work. She is still commuting to [employer] twice a week for work   [id number] Lyrica because it causes her to get moody. She has trigger point positive . She states she is always is pain, tramadol did not work in the past so she has been taking Aleve for aches and pains. Discussed Tylenol instead and try to exercise more.   Insomnia: took Trazodone but does not want to go back at this time. Hydroxyzine also made her tired, she is taking melatonin now and states seems to help . She states she has not had any problems sleeping lately because is always so tired  Dyslipidemia: LDL is 140, seen by Dr. Beverely Pace a normal stress test, heart rate can go up intermittently/ Reviewed last labs with patient.   GERD: on Omeprazol and stable, no heartburn or indigestion with medication, she is aware of long term risk of PPI, she tried weaning self off but unable to tolerate. Unchanged.   Morbid obesity: weight has been stable. Discussed life style modifications with patient again   Anemia iron deficiency: seeing hematologist, had two rounds of iron infusion. Ferritin still low, b12 level also low, we will give B12 injection today   Arthralgia: she has a history of elevated CRP and joint pains, previous normal ANA and RF , mother has a history of inflammatory arthritis  Depression:   Depression screen [facility] 2/9 10/04/2017 07/06/2017 12/18/2016 06/29/2016 01/25/2016  Decreased Interest 2 0 0 2 0  Down, Depressed, Hopeless 1 1 0 - 0  PHQ - 2 Score 3 1 0 2 0  Altered sleeping 3 2 - 3 -  Tired, decreased energy 3 2 - - -  Change in appetite 3 2 - (No Data) -  Feeling bad or failure about yourself  1 1 - 0 -  Trouble concentrating 3 3 - 0 -  Moving slowly or fidgety/restless 0 1 - 0 -  Suicidal thoughts 0 0 - 0 -  PHQ-9 Score 16 12 - 5 -  Difficult doing work/chores Very difficult Somewhat difficult - Not difficult at all -   Hypertension: BP Readings from Last 3 Encounters:  12/11/17 122/74  11/16/17 137/78  11/09/17 124/68   Obesity: Wt Readings from Last 3 Encounters:  12/11/17 253 lb (114.8 kg)  11/06/17 254 lb 6.4 oz (115.4 kg)  10/04/17 256 lb 12.8 oz (116.5 kg)   BMI Readings from Last 3 Encounters:  12/11/17 37.36 kg/m  11/06/17 38.68 kg/m  10/04/17 39.05 kg/m    Hep C Screening: today  STD testing and prevention (HIV/chl/gon/syphilis): N/A Intimate partner violence: negative  Sexual History/Pain during Intercourse: she has been sexually active with husband, no pain , no vaginal discharge  Menstrual History/LMP/Abnormal Bleeding: hysterectomy for DUB  Incontinence Symptoms: incomplete void and some intermittent dribbling, no urgency or frequency   Advanced  Care Planning: A voluntary discussion about advance care planning including the explanation and discussion of advance directives.  Discussed health care proxy and Living will, and the patient was able to identify a health care proxy as husband .  Patient does not have a living will at present time.   Breast cancer:  HM Mammogram  Date Value Ref Range Status  06/03/2010 Normal  Final    BRCA gene screening: mother had ovarian cancer at 42, she had breast cancer in her 8's and died with complications of lung cancer. Maternal grandmother died of cancer but she is not sure of the type, brother also  had colon cancer. Discussed checking level and she is willing to do it today  Cervical cancer screening: not indicated   Osteoporosis Screening: discussed high calcium diet , continue vitamin D supplementation   Lipids:  Lab Results  Component Value Date   CHOL 195 10/04/2017   CHOL 221 (H) 06/29/2016   CHOL 236 (H) 07/22/2015   Lab Results  Component Value Date   HDL 45 (L) 10/04/2017   HDL 53 06/29/2016   HDL 51 07/22/2015   Lab Results  Component Value Date   LDLCALC 122 (H) 10/04/2017   LDLCALC 140 (H) 06/29/2016   LDLCALC 160 (H) 07/22/2015   Lab Results  Component Value Date   TRIG 162 (H) 10/04/2017   TRIG 141 06/29/2016   TRIG 124 07/22/2015   Lab Results  Component Value Date   CHOLHDL 4.3 10/04/2017   CHOLHDL 4.2 06/29/2016   CHOLHDL 4.6 (H) 07/22/2015   No results found for: LDLDIRECT  Glucose:  Glucose  Date Value Ref Range Status  08/07/2013 135 (H) 65 - 99 mg/dL Final  08/06/2013 94 65 - 99 mg/dL Final  08/05/2013 106 (H) 65 - 99 mg/dL Final   Glucose, Bld  Date Value Ref Range Status  11/06/2017 86 70 - 99 mg/dL Final  10/04/2017 81 65 - 139 mg/dL Final    Comment:    .        Non-fasting reference interval .   12/18/2016 86 65 - 99 mg/dL Final    Comment:    .            Fasting reference interval .     Skin cancer:  Discussed atypical lesions  Colorectal cancer: last one 2017    Lung cancer:  Low Dose CT Chest recommended if Age 18-80 years, 30 pack-year currently smoking OR have quit w/in 15years. Patient does not qualify.   [id number]  Patient Active Problem List   Diagnosis Date Noted  . Iron deficiency anemia secondary to inadequate dietary iron intake 11/06/2017  . Special screening for malignant neoplasms, colon   . Benign neoplasm of descending colon   . Heartburn   . Gastric polyp   . Psoriasis 01/25/2016  . 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 Endometriosis 12/31/2014  . Insomnia, persistent 09/29/2014  . IBS  (irritable bowel syndrome) 09/29/2014  . 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 Dyslipidemia 09/29/2014  . Fibromyalgia syndrome 09/29/2014  . Gastro-esophageal reflux disease without esophagitis 09/29/2014  . Herpes labialis 09/29/2014  . Adult hypothyroidism 09/29/2014  . Family history of malignant neoplasm of breast 09/29/2014  . Morbid obesity, unspecified obesity type ([place]) 09/29/2014  . Major depression, recurrent, chronic ([place]) 09/29/2014  . Obstructive apnea 09/29/2014  . Abnormal presence of protein in urine 09/29/2014  . Allergic rhinitis 09/29/2014  . Vitamin D deficiency 09/29/2014  . Urge incontinence 09/29/2014  .  Headache, tension-type 09/29/2014  . Menopausal symptom 09/29/2014    Past Surgical History:  Procedure Laterality Date  . ABDOMINAL HYSTERECTOMY  [id number]  . bladder tact    . BREAST BIOPSY Right 09/02/2015   US Guided biopsy - Ribbon shaped marker - negative  . CHOLECYSTECTOMY  [id number]  . COLONOSCOPY    . COLONOSCOPY WITH PROPOFOL N/A 02/10/2016   Procedure: COLONOSCOPY WITH PROPOFOL;  Surgeon: Lucilla Lame, MD;  Location: [facility];  Service: Endoscopy;  Laterality: N/A;  . ESOPHAGOGASTRODUODENOSCOPY    . ESOPHAGOGASTRODUODENOSCOPY (EGD) WITH PROPOFOL N/A 02/10/2016   Procedure: ESOPHAGOGASTRODUODENOSCOPY (EGD) WITH PROPOFOL;  Surgeon: Lucilla Lame, MD;  Location: [facility];  Service: Endoscopy;  Laterality: N/A;  sleep apnea  . LIPOMA EXCISION    . POLYPECTOMY  02/10/2016   Procedure: POLYPECTOMY;  Surgeon: Lucilla Lame, MD;  Location: [facility];  Service: Endoscopy;;  . TONSILECTOMY, ADENOIDECTOMY, BILATERAL MYRINGOTOMY AND TUBES      Family History  Problem Relation Age of Onset  . Hypertension Mother   . Arthritis Mother   . Cancer Mother        ovarian,breast, lung, liver  . Breast cancer Mother 67  . Ovarian cancer Mother 84  . Lung cancer Mother 91  . Colon cancer Brother   . Other Father        lung  problems  . Ovarian cancer Maternal Grandmother        pt thinks is was ovarian but not sure  . Heart disease Brother   . Cancer Maternal Grandfather     Social History   Socioeconomic History  . Marital status: Married    Spouse name: Elenore Rota  . Number of children: 2  . Years of education: Not on file  . Highest education level: 12th grade  Occupational History  . Occupation: Psychologist, occupational as Chiropodist     Comment: learning [employer]  Social Needs  . Financial resource strain: Not hard at all  . Food insecurity:    Worry: Never true    Inability: Never true  . Transportation needs:    Medical: No    Non-medical: No  Tobacco Use  . Smoking status: Never Smoker  . Smokeless tobacco: Never Used  Substance and Sexual Activity  . Alcohol use: Yes    Alcohol/week: 0.0 standard drinks    Comment: rarely  . Drug use: No  . Sexual activity: Yes    Partners: Male  Lifestyle  . Physical activity:    Days per week: 0 days    Minutes per session: 0 min  . Stress: Very much  Relationships  . Social connections:    Talks on phone: Twice a week    Gets together: More than three times a week    Attends religious service: More than 4 times per year    Active member of club or organization: No    Attends meetings of clubs or organizations: Never    Relationship status: Married  . Intimate partner violence:    Fear of current or ex partner: No    Emotionally abused: No    Physically abused: No    Forced sexual activity: No  Other Topics Concern  . Not on file  Social History Narrative  . Not on file     Current Outpatient Medications:  .  amphetamine-dextroamphetamine (ADDERALL XR) 15 MG 24 hr capsule, Take 1 capsule by mouth every morning., Disp: 30 capsule, Rfl: 0 .  buPROPion (WELLBUTRIN XL) 150  MG 24 hr tablet, TAKE 1 TABLET(150 MG) BY MOUTH DAILY, Disp: 90 tablet, Rfl: 1 .  Ca Phosphate-Cholecalciferol (CALCIUM/VITAMIN D3 GUMMIES) 200-200 MG-UNIT CHEW, Chew 1  each by mouth daily., Disp: , Rfl:  .  citalopram (CELEXA) 20 MG tablet, TAKE 1 TABLET(20 MG) BY MOUTH DAILY, Disp: 90 tablet, Rfl: 1 .  levothyroxine (SYNTHROID, LEVOTHROID) 200 MCG tablet, Take 1 tablet (200 mcg total) by mouth daily before breakfast., Disp: 90 tablet, Rfl: 0 .  omeprazole (PRILOSEC) 20 MG capsule, take 1 capsule by mouth twice a day BEFORE A MEAL, Disp: 180 capsule, Rfl: 1 .  valACYclovir (VALTREX) 1000 MG tablet, TAKE 1 TABLET(1000 MG) BY MOUTH TWICE DAILY AS NEEDED, Disp: 30 tablet, Rfl: 0 .  acetaminophen (TYLENOL) 500 MG tablet, Take 1 tablet (500 mg total) by mouth every 6 (six) hours as needed., Disp: 30 tablet, Rfl: 0 .  amphetamine-dextroamphetamine (ADDERALL XR) 15 MG 24 hr capsule, Take 1 capsule by mouth every morning. Fill 01/10/2018, Disp: 30 capsule, Rfl: 0 .  amphetamine-dextroamphetamine (ADDERALL XR) 15 MG 24 hr capsule, Take 1 capsule by mouth every morning. Fill 02/09/2018, Disp: 30 capsule, Rfl: 0 .  betamethasone dipropionate (DIPROLENE) 0.05 % cream, apply A THIN LAYER to affected area twice a day UNTIL CLEAR, Disp: , Rfl: 0 .  hydrOXYzine (ATARAX/VISTARIL) 25 MG tablet, , Disp: , Rfl: 0  Current Facility-Administered Medications:  .  cyanocobalamin ((VITAMIN B-12)) injection 1,000 mcg, 1,000 mcg, Intramuscular, Once, Steele Sizer, MD  No Known Allergies   I personally reviewed Family, Social and Surgical history with the patient/caregiver today.    ROS  Constitutional: Negative for fever or weight change.  Respiratory: Negative for cough and shortness of breath.   Cardiovascular: Negative for chest pain or palpitations.  Gastrointestinal: Negative for abdominal pain, positive for change in  bowel changes.  Musculoskeletal: positive for gait problem and intermittent  joint swelling.  Skin: Negative for rash.  Neurological: Negative for dizziness or headache.  No other specific complaints in a complete review of systems (except as listed in HPI  above).  Objective  Vitals:   12/11/17 0815  BP: 122/74  Pulse: 91  Resp: 16  Temp: 98 F (36.7 C)  TempSrc: Oral  SpO2: 98%  Weight: 253 lb (114.8 kg)  Height: _0  (1.753 m)    Body mass index is 37.36 kg/m.  Physical Exam  Constitutional: Patient appears well-developed and obese. No distress.  HENT: Head: Normocephalic and atraumatic. Ears: B TMs ok, no erythema or effusion; Nose: Nose normal. Mouth/Throat: Oropharynx is clear and moist. No oropharyngeal exudate.  Eyes: Conjunctivae and EOM are normal. Pupils are equal, round, and reactive to light. No scleral icterus.  Neck: Normal range of motion. Neck supple. No JVD present. No thyromegaly present.  Cardiovascular: Normal rate, regular rhythm and normal heart sounds.  No murmur heard. No BLE edema. Pulmonary/Chest: Effort normal and breath sounds normal. No respiratory distress. Abdominal: Soft. Bowel sounds are normal, no distension. There is no tenderness. no masses Breast: no lumps or masses, no nipple discharge or rashes FEMALE GENITALIA:  External genitalia normal External urethra normal Pelvic not done  RECTAL: not done Musculoskeletal:slow gait, antalgic, pain on both ankles with some swelling, no synovitis on hands, trigger point positive  Neurological: he is alert and oriented to person, place, and time. No cranial nerve deficit. Coordination, balance, strength, speech and gait are normal.  Skin: Skin is warm and dry. No rash noted. No erythema.  Psychiatric: Patient  has a normal mood and affect. behavior is normal. Judgment and thought content normal.   Recent Results (from the past 2160 hour(s))  Lipid panel     Status: Abnormal   Collection Time: 10/04/17 11:41 AM  Result Value Ref Range   Cholesterol 195 <200 mg/dL   HDL 45 (L) >50 mg/dL   Triglycerides 162 (H) <150 mg/dL   LDL Cholesterol (Calc) 122 (H) mg/dL (calc)    Comment: Reference range: <100 . Desirable range <100 mg/dL for primary  prevention;   <70 mg/dL for patients with CHD or diabetic patients  with > or = 2 CHD risk factors. Marland Kitchen LDL-C is now calculated using the Martin-Hopkins  calculation, which is a validated novel method providing  better accuracy than the Friedewald equation in the  estimation of LDL-C.  Cresenciano Genre et al. Annamaria Helling. 2951;884(16): 2061-2068  (http://education.[facility].com/faq/FAQ164)    Total CHOL/HDL Ratio 4.3 <5.0 (calc)   Non-HDL Cholesterol (Calc) 150 (H) <130 mg/dL (calc)    Comment: For patients with diabetes plus 1 major ASCVD risk  factor, treating to a non-HDL-C goal of <100 mg/dL  (LDL-C of <70 mg/dL) is considered a therapeutic  option.   Hemoglobin A1c     Status: None   Collection Time: 10/04/17 11:41 AM  Result Value Ref Range   Hgb A1c MFr Bld 5.6 <5.7 % of total Hgb    Comment: For the purpose of screening for the presence of diabetes: . <5.7%       Consistent with the absence of diabetes 5.7-6.4%    Consistent with increased risk for diabetes             (prediabetes) > or =6.5%  Consistent with diabetes . This assay result is consistent with a decreased risk of diabetes. . Currently, no consensus exists regarding use of hemoglobin A1c for diagnosis of diabetes in children. . According to American Diabetes Association (ADA) guidelines, hemoglobin A1c <7.0% represents optimal control in non-pregnant diabetic patients. Different metrics may apply to specific patient populations.  Standards of Medical Care in Diabetes(ADA). .    Mean Plasma Glucose 114 (calc)   eAG (mmol/L) 6.3 (calc)  CBC with Differential/Platelet     Status: Abnormal   Collection Time: 10/04/17 11:41 AM  Result Value Ref Range   WBC 8.0 3.8 - 10.8 Thousand/uL   RBC 4.12 3.80 - 5.10 Million/uL   Hemoglobin 8.6 (L) 11.7 - 15.5 g/dL   HCT 28.5 (L) 35.0 - 45.0 %   MCV 69.2 (L) 80.0 - 100.0 fL   MCH 20.9 (L) 27.0 - 33.0 pg   MCHC 30.2 (L) 32.0 - 36.0 g/dL   RDW 16.1 (H) 11.0 - 15.0 %    Platelets 408 (H) 140 - 400 Thousand/uL   MPV 10.5 7.5 - 12.5 fL   Neutro Abs 5,032 1,500 - 7,800 cells/uL   Lymphs Abs 2,416 850 - 3,900 cells/uL   WBC mixed population 440 200 - 950 cells/uL   Eosinophils Absolute 40 15 - 500 cells/uL   Basophils Absolute 72 0 - 200 cells/uL   Neutrophils Relative % 62.9 %   Total Lymphocyte 30.2 %   Monocytes Relative 5.5 %   Eosinophils Relative 0.5 %   Basophils Relative 0.9 %  COMPLETE METABOLIC PANEL WITH GFR     Status: Abnormal   Collection Time: 10/04/17 11:41 AM  Result Value Ref Range   Glucose, Bld 81 65 - 139 mg/dL    Comment: .  Non-fasting reference interval .    BUN 10 7 - 25 mg/dL   Creat 1.05 0.50 - 1.05 mg/dL    Comment: For patients >40 years of age, the reference limit for Creatinine is approximately 13% higher for people identified as African-American. .    GFR, Est Non African American 59 (L) > OR = 60 mL/min/1.26m   GFR, Est African American 68 > OR = 60 mL/min/1.722m  BUN/Creatinine Ratio NOT APPLICABLE 6 - 22 (calc)   Sodium 138 135 - 146 mmol/L   Potassium 4.1 3.5 - 5.3 mmol/L   Chloride 103 98 - 110 mmol/L   CO2 28 20 - 32 mmol/L   Calcium 9.1 8.6 - 10.4 mg/dL   Total Protein 7.0 6.1 - 8.1 g/dL   Albumin 3.9 3.6 - 5.1 g/dL   Globulin 3.1 1.9 - 3.7 g/dL (calc)   AG Ratio 1.3 1.0 - 2.5 (calc)   Total Bilirubin 0.3 0.2 - 1.2 mg/dL   Alkaline phosphatase (APISO) 113 33 - 130 U/L   AST 12 10 - 35 U/L   ALT 10 6 - 29 U/L  VITAMIN D 25 Hydroxy (Vit-D Deficiency, Fractures)     Status: Abnormal   Collection Time: 10/04/17 11:41 AM  Result Value Ref Range   Vit D, 25-Hydroxy 28 (L) 30 - 100 ng/mL    Comment: Vitamin D Status         25-OH Vitamin D: . Deficiency:                    <20 ng/mL Insufficiency:             20 - 29 ng/mL Optimal:                 > or = 30 ng/mL . For 25-OH Vitamin D testing on patients on  D2-supplementation and patients for whom quantitation  of D2 and D3 fractions is  required, the [facility]ureD(TM) 25-OH VIT D, (D2,D3), LC/MS/MS is recommended: order  code [id number] >2y69yr . For more information on this test, go to: http://education.[facility].com/faq/FAQ163 (This link is being provided for  informational/educational purposes only.)   Vitamin B12     Status: None   Collection Time: 10/04/17 11:41 AM  Result Value Ref Range   Vitamin B-12 431 200 - 1,100 pg/mL  TSH     Status: Abnormal   Collection Time: 10/04/17 11:41 AM  Result Value Ref Range   TSH 12.44 (H) 0.40 - 4.50 mIU/L  CBC MORPHOLOGY     Status: None   Collection Time: 10/04/17 11:41 AM  Result Value Ref Range   CBC MORPHOLOGY  NORMAL    Comment: Poikilocytosis 1 + Hypochromasia 1 +   SPEP with reflex to IFE     Status: None   Collection Time: 11/06/17  1:06 PM  Result Value Ref Range   Total Protein ELP 7.1 6.0 - 8.5 g/dL   Albumin ELP 3.4 2.9 - 4.4 g/dL   Alpha-1-Globulin 0.3 0.0 - 0.4 g/dL   Alpha-2-Globulin 0.7 0.4 - 1.0 g/dL   Beta Globulin 1.3 0.7 - 1.3 g/dL   Gamma Globulin 1.4 0.4 - 1.8 g/dL   M-Spike, % Not Observed Not Observed g/dL   GLOBULIN, TOTAL 3.7 2.2 - 3.9 g/dL   A/G Ratio 0.9 0.7 - 1.7   Comment Comment     Comment: (NOTE) Protein electrophoresis scan will follow via computer, mail, or courier delivery.    SPEP Interpretation Comment  Comment: (NOTE) The SPE pattern appears essentially unremarkable. Evidence of monoclonal protein is not apparent. Performed At: [facility] [place], [place] [id number] Rush Farmer MD [id number]   Folate, Serum     Status: None   Collection Time: 11/06/17  1:06 PM  Result Value Ref Range   Folate 12.7 >5.9 ng/mL    Comment: Performed at [facility], [place] [address] [place], [place] [address]  Vitamin B12     Status: None   Collection Time: 11/06/17  1:06 PM  Result Value Ref Range   Vitamin B-12 262 180 - 914 pg/mL    Comment: (NOTE) This assay is  not validated for testing neonatal or myeloproliferative syndrome specimens for Vitamin B12 levels. Performed at [facility], [place] [address] [place], [place] [address]   CMP ([place] only)     Status: Abnormal   Collection Time: 11/06/17  1:06 PM  Result Value Ref Range   Sodium 141 135 - 145 mmol/L   Potassium 4.0 3.5 - 5.1 mmol/L   Chloride 105 98 - 111 mmol/L   CO2 29 22 - 32 mmol/L   Glucose, Bld 86 70 - 99 mg/dL   BUN 11 6 - 20 mg/dL   Creatinine 0.99 0.44 - 1.00 mg/dL   Calcium 9.2 8.9 - 10.3 mg/dL   Total Protein 7.7 6.5 - 8.1 g/dL   Albumin 3.3 (L) 3.5 - 5.0 g/dL   AST 13 (L) 15 - 41 U/L   ALT 12 0 - 44 U/L   Alkaline Phosphatase 123 38 - 126 U/L   Total Bilirubin 0.3 0.3 - 1.2 mg/dL   GFR, Est Non Af Am >60 >60 mL/min   GFR, Est AFR Am >60 >60 mL/min    Comment: (NOTE) The eGFR has been calculated using the CKD EPI equation. This calculation has not been validated in all clinical situations. eGFR's persistently <60 mL/min signify possible Chronic Kidney Disease.    Anion gap 7 5 - 15    Comment: Performed at [facility], [address], [place], [place] [address]  CBC with Differential ([place] Only)     Status: Abnormal   Collection Time: 11/06/17  1:06 PM  Result Value Ref Range   WBC Count 6.3 3.9 - 10.3 K/uL   RBC 4.22 3.70 - 5.45 MIL/uL   Hemoglobin 8.8 (L) 11.6 - 15.9 g/dL   HCT 30.5 (L) 34.8 - 46.6 %   MCV 72.3 (L) 79.5 - 101.0 fL   MCH 20.9 (L) 25.1 - 34.0 pg   MCHC 28.9 (L) 31.5 - 36.0 g/dL   RDW 17.8 (H) 11.2 - 14.5 %   Platelet Count 359 145 - 400 K/uL   Neutrophils Relative % 65 %   Neutro Abs 4.1 1.5 - 6.5 K/uL   Lymphocytes Relative 28 %   Lymphs Abs 1.8 0.9 - 3.3 K/uL   Monocytes Relative 6 %   Monocytes Absolute 0.4 0.1 - 0.9 K/uL   Eosinophils Relative 0 %   Eosinophils Absolute 0.0 0.0 - 0.5 K/uL   Basophils Relative 1 %   Basophils Absolute 0.0 0.0 - 0.1 K/uL    Comment:  Performed at [facility], [place] [address], [place], [place] [address]  Iron and TIBC     Status: Abnormal   Collection Time: 11/06/17  1:07 PM  Result Value Ref Range   Iron 16 (L) 41 - 142 ug/dL  TIBC 366 236 - 444 ug/dL   Saturation Ratios 4 (L) 21 - 57 %   UIBC 350 ug/dL    Comment: Performed at [facility], [place] [address] [place], [place] [address]  Ferritin     Status: Abnormal   Collection Time: 11/06/17  1:07 PM  Result Value Ref Range   Ferritin <4 (L) 11 - 307 ng/mL    Comment: Performed at [facility], [place] [address], [place], [place] [address]      PHQ2/9: Depression screen [facility] 2/9 10/04/2017 07/06/2017 12/18/2016 06/29/2016 01/25/2016  Decreased Interest 2 0 0 2 0  Down, Depressed, Hopeless 1 1 0 - 0  PHQ - 2 Score 3 1 0 2 0  Altered sleeping 3 2 - 3 -  Tired, decreased energy 3 2 - - -  Change in appetite 3 2 - (No Data) -  Feeling bad or failure about yourself  1 1 - 0 -  Trouble concentrating 3 3 - 0 -  Moving slowly or fidgety/restless 0 1 - 0 -  Suicidal thoughts 0 0 - 0 -  PHQ-9 Score 16 12 - 5 -  Difficult doing work/chores Very difficult Somewhat difficult - Not difficult at all -     Fall Risk: Fall Risk  12/11/2017 10/04/2017 07/06/2017 12/18/2016 06/29/2016  Falls in the past year? Yes No No Yes No  Number falls in past yr: 2 or more - - 1 -  Injury with Fall? Yes - - No -  Comment Bruised her ribs-fell from a ladder - - - -     Functional Status Survey: Is the patient deaf or have difficulty hearing?: No Does the patient have difficulty seeing, even when wearing glasses/contacts?: No Does the patient have difficulty concentrating, remembering, or making decisions?: No Does the patient have difficulty walking or climbing stairs?: No Does the patient have difficulty dressing or bathing?: No Does the patient have difficulty doing errands alone such as visiting a doctor's office  or shopping?: No   Assessment & Plan  1. Well woman exam   2. Need for immunization against influenza  - Flu Vaccine QUAD 6+ mos PF IM (Fluarix Quad PF)  3. Major depression, recurrent, chronic (HCC)  - amphetamine-dextroamphetamine (ADDERALL XR) 15 MG 24 hr capsule; Take 1 capsule by mouth every morning.  Dispense: 30 capsule; Refill: 0 - buPROPion (WELLBUTRIN XL) 150 MG 24 hr tablet; TAKE 1 TABLET(150 MG) BY MOUTH DAILY  Dispense: 90 tablet; Refill: 1 - citalopram (CELEXA) 20 MG tablet; TAKE 1 TABLET(20 MG) BY MOUTH DAILY  Dispense: 90 tablet; Refill: 1 - amphetamine-dextroamphetamine (ADDERALL XR) 15 MG 24 hr capsule; Take 1 capsule by mouth every morning. Fill 01/10/2018  Dispense: 30 capsule; Refill: 0 - amphetamine-dextroamphetamine (ADDERALL XR) 15 MG 24 hr capsule; Take 1 capsule by mouth every morning. Fill 02/09/2018  Dispense: 30 capsule; Refill: 0  4. Gastro-esophageal reflux disease without esophagitis  - omeprazole (PRILOSEC) 20 MG capsule; take 1 capsule by mouth twice a day BEFORE A MEAL  Dispense: 180 capsule; Refill: 1  5. Adult hypothyroidism  - TSH  6. Fibromyalgia syndrome  - amphetamine-dextroamphetamine (ADDERALL XR) 15 MG 24 hr capsule; Take 1 capsule by mouth every morning.  Dispense: 30 capsule; Refill: 0 - amphetamine-dextroamphetamine (ADDERALL XR) 15 MG 24 hr capsule; Take 1 capsule by mouth every morning. Fill 01/10/2018  Dispense: 30 capsule; Refill: 0 - amphetamine-dextroamphetamine (ADDERALL XR) 15 MG 24 hr capsule; Take 1  capsule by mouth every morning. Fill 02/09/2018  Dispense: 30 capsule; Refill: 0 - acetaminophen (TYLENOL) 500 MG tablet; Take 1 tablet (500 mg total) by mouth every 6 (six) hours as needed.  Dispense: 30 tablet; Refill: 0  7. B12 deficiency  - cyanocobalamin ((VITAMIN B-12)) injection 1,000 mcg  8. Vitamin D deficiency   9. Need for hepatitis C screening test  - Hepatitis C Antibody  10. Arthralgia, unspecified  joint  - Rheumatoid Factor - ANA,IFA RA Diag Pnl w/rflx Tit/Patn - C-reactive protein - Sedimentation rate  11. Family history of ovarian cancer  - BRCAvantage, Comprehensive ([facility])  12. Family history of breast cancer  - BRCAvantage, Comprehensive ([facility])  13. Family history of colon cancer  - BRCAvantage, Comprehensive ([facility]) - Ambulatory referral to Gastroenterology  14. Change in bowel movement  - Ambulatory referral to Gastroenterology  -USPSTF grade A and B recommendations reviewed with patient; age-appropriate recommendations, preventive care, screening tests, etc discussed and encouraged; healthy living encouraged; see AVS for patient education given to patient -Discussed importance of 150 minutes of physical activity weekly, eat two servings of fish weekly, eat one serving of tree nuts ( cashews, pistachios, pecans, almonds.Marland Kitchen) every other day, eat 6 servings of fruit/vegetables daily and drink plenty of water and avoid sweet beverages.

## 2017-12-11 NOTE — Patient Instructions (Signed)
Preventive Care 40-64 Years, Female Preventive care refers to lifestyle choices and visits with your health care provider that can promote health and wellness. What does preventive care include?  A yearly physical exam. This is also called an annual well check.  Dental exams once or twice a year.  Routine eye exams. Ask your health care provider how often you should have your eyes checked.  Personal lifestyle choices, including: ? Daily care of your teeth and gums. ? Regular physical activity. ? Eating a healthy diet. ? Avoiding tobacco and drug use. ? Limiting alcohol use. ? Practicing safe sex. ? Taking low-dose aspirin daily starting at age 58. ? Taking vitamin and mineral supplements as recommended by your health care provider. What happens during an annual well check? The services and screenings done by your health care provider during your annual well check will depend on your age, overall health, lifestyle risk factors, and family history of disease. Counseling Your health care provider may ask you questions about your:  Alcohol use.  Tobacco use.  Drug use.  Emotional well-being.  Home and relationship well-being.  Sexual activity.  Eating habits.  Work and work Statistician.  Method of birth control.  Menstrual cycle.  Pregnancy history.  Screening You may have the following tests or measurements:  Height, weight, and BMI.  Blood pressure.  Lipid and cholesterol levels. These may be checked every 5 years, or more frequently if you are over 81 years old.  Skin check.  Lung cancer screening. You may have this screening every year starting at age 78 if you have a 30-pack-year history of smoking and currently smoke or have quit within the past 15 years.  Fecal occult blood test (FOBT) of the stool. You may have this test every year starting at age 65.  Flexible sigmoidoscopy or colonoscopy. You may have a sigmoidoscopy every 5 years or a colonoscopy  every 10 years starting at age 30.  Hepatitis C blood test.  Hepatitis B blood test.  Sexually transmitted disease (STD) testing.  Diabetes screening. This is done by checking your blood sugar (glucose) after you have not eaten for a while (fasting). You may have this done every 1-3 years.  Mammogram. This may be done every 1-2 years. Talk to your health care provider about when you should start having regular mammograms. This may depend on whether you have a family history of breast cancer.  BRCA-related cancer screening. This may be done if you have a family history of breast, ovarian, tubal, or peritoneal cancers.  Pelvic exam and Pap test. This may be done every 3 years starting at age 80. Starting at age 36, this may be done every 5 years if you have a Pap test in combination with an HPV test.  Bone density scan. This is done to screen for osteoporosis. You may have this scan if you are at high risk for osteoporosis.  Discuss your test results, treatment options, and if necessary, the need for more tests with your health care provider. Vaccines Your health care provider may recommend certain vaccines, such as:  Influenza vaccine. This is recommended every year.  Tetanus, diphtheria, and acellular pertussis (Tdap, Td) vaccine. You may need a Td booster every 10 years.  Varicella vaccine. You may need this if you have not been vaccinated.  Zoster vaccine. You may need this after age 5.  Measles, mumps, and rubella (MMR) vaccine. You may need at least one dose of MMR if you were born in  1957 or later. You may also need a second dose.  Pneumococcal 13-valent conjugate (PCV13) vaccine. You may need this if you have certain conditions and were not previously vaccinated.  Pneumococcal polysaccharide (PPSV23) vaccine. You may need one or two doses if you smoke cigarettes or if you have certain conditions.  Meningococcal vaccine. You may need this if you have certain  conditions.  Hepatitis A vaccine. You may need this if you have certain conditions or if you travel or work in places where you may be exposed to hepatitis A.  Hepatitis B vaccine. You may need this if you have certain conditions or if you travel or work in places where you may be exposed to hepatitis B.  Haemophilus influenzae type b (Hib) vaccine. You may need this if you have certain conditions.  Talk to your health care provider about which screenings and vaccines you need and how often you need them. This information is not intended to replace advice given to you by your health care provider. Make sure you discuss any questions you have with your health care provider. Document Released: 03/26/2015 Document Revised: 11/17/2015 Document Reviewed: 12/29/2014 Elsevier Interactive Patient Education  2018 [employer]  

## 2017-12-13 ENCOUNTER — Telehealth: Payer: Self-pay | Admitting: *Deleted

## 2017-12-13 NOTE — Telephone Encounter (Signed)
Message from [place], lab results difficult to read. Re-faxed to [phone]

## 2017-12-14 LAB — HEPATITIS C ANTIBODY
Hepatitis C Ab: NONREACTIVE
SIGNAL TO CUT-OFF: 0.03 (ref <1.00)

## 2017-12-14 LAB — TSH: TSH: 2.25 m[IU]/L (ref 0.40–4.50)

## 2017-12-14 LAB — C-REACTIVE PROTEIN: CRP: 14.2 mg/L — AB (ref <8.0)

## 2017-12-14 LAB — ANA,IFA RA DIAG PNL W/RFLX TIT/PATN
ANA: NEGATIVE
Cyclic Citrullin Peptide Ab: <16 UNITS
Rhuematoid fact SerPl-aCnc: <14 IU/mL (ref <14)

## 2017-12-14 LAB — SEDIMENTATION RATE: Sed Rate: 39 mm/h — ABNORMAL HIGH (ref 0–30)

## 2017-12-31 ENCOUNTER — Other Ambulatory Visit: Payer: 59

## 2017-12-31 ENCOUNTER — Ambulatory Visit: Payer: 59 | Admitting: Internal Medicine

## 2018-01-02 ENCOUNTER — Inpatient Hospital Stay: Payer: 59 | Attending: Internal Medicine

## 2018-01-02 ENCOUNTER — Encounter: Payer: Self-pay | Admitting: Internal Medicine

## 2018-01-02 ENCOUNTER — Inpatient Hospital Stay (HOSPITAL_BASED_OUTPATIENT_CLINIC_OR_DEPARTMENT_OTHER): Payer: 59 | Admitting: Internal Medicine

## 2018-01-02 VITALS — BP 121/86 | HR 79 | Resp 18 | Ht 69.0 in | Wt 249.5 lb

## 2018-01-02 DIAGNOSIS — D508 Other iron deficiency anemias: Secondary | ICD-10-CM

## 2018-01-02 DIAGNOSIS — D509 Iron deficiency anemia, unspecified: Secondary | ICD-10-CM | POA: Diagnosis not present

## 2018-01-02 LAB — IRON AND TIBC
Iron: 36 ug/dL — ABNORMAL LOW (ref 41–142)
Saturation Ratios: 12 % — ABNORMAL LOW (ref 21–57)
TIBC: 309 ug/dL (ref 236–444)
UIBC: 273 ug/dL

## 2018-01-02 LAB — CBC WITH DIFFERENTIAL (CANCER CENTER ONLY)
ABS IMMATURE GRANULOCYTES: 0.01 10*3/uL (ref 0.00–0.07)
BASOS ABS: 0.1 10*3/uL (ref 0.0–0.1)
Basophils Relative: 1 %
EOS PCT: 0 %
Eosinophils Absolute: 0 10*3/uL (ref 0.0–0.5)
HEMATOCRIT: 42.8 % (ref 36.0–46.0)
HEMOGLOBIN: 12.7 g/dL (ref 12.0–15.0)
IMMATURE GRANULOCYTES: 0 %
LYMPHS ABS: 2 10*3/uL (ref 0.7–4.0)
LYMPHS PCT: 27 %
MCH: 24.9 pg — AB (ref 26.0–34.0)
MCHC: 29.7 g/dL — AB (ref 30.0–36.0)
MCV: 83.8 fL (ref 80.0–100.0)
Monocytes Absolute: 0.5 10*3/uL (ref 0.1–1.0)
Monocytes Relative: 7 %
NEUTROS ABS: 4.8 10*3/uL (ref 1.7–7.7)
NEUTROS PCT: 65 %
NRBC: 0 % (ref 0.0–0.2)
Platelet Count: 304 10*3/uL (ref 150–400)
RBC: 5.11 MIL/uL (ref 3.87–5.11)
RDW: 23.3 % — ABNORMAL HIGH (ref 11.5–15.5)
WBC: 7.4 10*3/uL (ref 4.0–10.5)

## 2018-01-02 LAB — FERRITIN: Ferritin: 16 ng/mL (ref 11–307)

## 2018-01-02 NOTE — Progress Notes (Signed)
[facility] [phone]   [phone]  OFFICE PROGRESS NOTE  Steele Sizer, MD [address] [place] [address]  DIAGNOSIS: Persistent microcytic anemia likely iron deficiency anemia secondary to inadequate dietary intake and probably minor GI bleed from her gastritis.  PRIOR THERAPY: Feraheme infusion 510 mg IV weekly for 2 doses.  She is expected to receive the first infusion on November 09, 2017  CURRENT THERAPY: None.  INTERVAL HISTORY: Debra Soto 57 y.o. female returns to the clinic today for follow-up visit.  The patient is feeling fine today except for fatigue and arthralgia.  She denied having any recent chest pain, shortness breath, cough or hemoptysis.  She has no nausea, vomiting, diarrhea or constipation.  She tolerated the iron infusion well but she did not notice significant improvement in her general condition.  She has no fever or chills.  She is here today for evaluation with repeat CBC, iron study and ferritin.  MEDICAL HISTORY: Past Medical History:  Diagnosis Date  . Allergy   . Arthritis   . Depression   . Erythrodermic psoriasis   . Fibromyalgia   . GERD (gastroesophageal reflux disease)   . Hyperlipemia   . Insomnia   . Obesity   . Recurrent HSV (herpes simplex virus)   . Sleep apnea    pt states does not use CPAP  . Thyroid disease   . Vitamin D deficiency     ALLERGIES:  has No Known Allergies.  MEDICATIONS:  Current Outpatient Medications  Medication Sig Dispense Refill  . acetaminophen (TYLENOL) 500 MG tablet Take 1 tablet (500 mg total) by mouth every 6 (six) hours as needed. 30 tablet 0  . amphetamine-dextroamphetamine (ADDERALL XR) 15 MG 24 hr capsule Take 1 capsule by mouth every morning. Fill 02/09/2018 30 capsule 0  . betamethasone dipropionate (DIPROLENE) 0.05 % cream apply A THIN LAYER to affected area twice a day UNTIL CLEAR  0  . buPROPion (WELLBUTRIN XL) 150 MG 24 hr tablet TAKE  1 TABLET(150 MG) BY MOUTH DAILY 90 tablet 1  . Ca Phosphate-Cholecalciferol (CALCIUM/VITAMIN D3 GUMMIES) 200-200 MG-UNIT CHEW Chew 1 each by mouth daily.    . citalopram (CELEXA) 20 MG tablet TAKE 1 TABLET(20 MG) BY MOUTH DAILY 90 tablet 1  . hydrOXYzine (ATARAX/VISTARIL) 25 MG tablet   0  . levothyroxine (SYNTHROID, LEVOTHROID) 200 MCG tablet Take 1 tablet (200 mcg total) by mouth daily before breakfast. 90 tablet 0  . omeprazole (PRILOSEC) 20 MG capsule take 1 capsule by mouth twice a day BEFORE A MEAL 180 capsule 1  . valACYclovir (VALTREX) 1000 MG tablet TAKE 1 TABLET(1000 MG) BY MOUTH TWICE DAILY AS NEEDED 30 tablet 0   No current facility-administered medications for this visit.     SURGICAL HISTORY:  Past Surgical History:  Procedure Laterality Date  . ABDOMINAL HYSTERECTOMY  [id number]  . bladder tact    . BREAST BIOPSY Right 09/02/2015   US Guided biopsy - Ribbon shaped marker - negative  . CHOLECYSTECTOMY  [id number]  . COLONOSCOPY    . COLONOSCOPY WITH PROPOFOL N/A 02/10/2016   Procedure: COLONOSCOPY WITH PROPOFOL;  Surgeon: Lucilla Lame, MD;  Location: [facility];  Service: Endoscopy;  Laterality: N/A;  . ESOPHAGOGASTRODUODENOSCOPY    . ESOPHAGOGASTRODUODENOSCOPY (EGD) WITH PROPOFOL N/A 02/10/2016   Procedure: ESOPHAGOGASTRODUODENOSCOPY (EGD) WITH PROPOFOL;  Surgeon: Lucilla Lame, MD;  Location: [facility];  Service: Endoscopy;  Laterality: N/A;  sleep apnea  .  LIPOMA EXCISION    . POLYPECTOMY  02/10/2016   Procedure: POLYPECTOMY;  Surgeon: Lucilla Lame, MD;  Location: [facility];  Service: Endoscopy;;  . TONSILECTOMY, ADENOIDECTOMY, BILATERAL MYRINGOTOMY AND TUBES      REVIEW OF SYSTEMS:  A comprehensive review of systems was negative except for: Constitutional: positive for fatigue Musculoskeletal: positive for arthralgias   PHYSICAL EXAMINATION: General appearance: alert, cooperative, fatigued and no distress Head: Normocephalic, without obvious  abnormality, atraumatic Neck: no adenopathy, no JVD, supple, symmetrical, trachea midline and thyroid not enlarged, symmetric, no tenderness/mass/nodules Lymph nodes: Cervical, supraclavicular, and axillary nodes normal. Resp: clear to auscultation bilaterally Back: symmetric, no curvature. ROM normal. No CVA tenderness. Cardio: regular rate and rhythm, S1, S2 normal, no murmur, click, rub or gallop GI: soft, non-tender; bowel sounds normal; no masses,  no organomegaly Extremities: extremities normal, atraumatic, no cyanosis or edema  ECOG PERFORMANCE STATUS: 1 - Symptomatic but completely ambulatory  Blood pressure 121/86, pulse 79, resp. rate 18, height 5\' 9"  (1.753 m), weight 249 lb 8 oz (113.2 kg), SpO2 99 %.  LABORATORY DATA: Lab Results  Component Value Date   WBC 7.4 01/02/2018   HGB 12.7 01/02/2018   HCT 42.8 01/02/2018   MCV 83.8 01/02/2018   PLT 304 01/02/2018      Chemistry      Component Value Date/Time   NA 141 11/06/2017 1306   NA 142 07/22/2015 0943   NA 138 08/07/2013 0514   K 4.0 11/06/2017 1306   K 4.8 08/07/2013 0514   CL 105 11/06/2017 1306   CL 105 08/07/2013 0514   CO2 29 11/06/2017 1306   CO2 30 08/07/2013 0514   BUN 11 11/06/2017 1306   BUN 14 07/22/2015 0943   BUN 9 08/07/2013 0514   CREATININE 0.99 11/06/2017 1306   CREATININE 1.05 10/04/2017 1141      Component Value Date/Time   CALCIUM 9.2 11/06/2017 1306   CALCIUM 9.1 08/07/2013 0514   ALKPHOS 123 11/06/2017 1306   ALKPHOS 137 (H) 08/07/2013 0514   AST 13 (L) 11/06/2017 1306   ALT 12 11/06/2017 1306   ALT 35 08/07/2013 0514   BILITOT 0.3 11/06/2017 1306       RADIOGRAPHIC STUDIES: No results found.  ASSESSMENT AND PLAN: This is a very pleasant 57 years old white female with history of iron deficiency anemia secondary to an adequate iron intake.  The patient has intolerance to the oral iron tablets.  She was treated with Feraheme infusions several weeks ago. CBC today showed  significant improvement in her hemoglobin and hematocrit.  The patient continues to have mild fatigue. I recommended for her to continue on observation for now with repeat CBC, iron study and ferritin and 3 months.  I will consider The patient for Feraheme infusion in the future if needed. She was advised to call immediately if she has any concerning symptoms in the interval. The patient voices understanding of current disease status and treatment options and is in agreement with the current care plan.  All questions were answered. The patient knows to call the clinic with any problems, questions or concerns. We can certainly see the patient much sooner if necessary.  I spent 10 minutes counseling the patient face to face. The total time spent in the appointment was 15 minutes.  Disclaimer: This note was dictated with voice recognition software. Similar sounding words can inadvertently be transcribed and may not be corrected upon review.

## 2018-01-03 ENCOUNTER — Telehealth: Payer: Self-pay | Admitting: Internal Medicine

## 2018-01-03 NOTE — Telephone Encounter (Signed)
Scheduled appt per 10/23 los - lab and f/u in 3 months - reminder letter sent in the mail .

## 2018-01-04 ENCOUNTER — Ambulatory Visit: Payer: 59 | Admitting: Family Medicine

## 2018-01-10 ENCOUNTER — Ambulatory Visit: Payer: 59 | Admitting: Gastroenterology

## 2018-01-10 ENCOUNTER — Encounter: Payer: Self-pay | Admitting: Gastroenterology

## 2018-01-10 DIAGNOSIS — D509 Iron deficiency anemia, unspecified: Secondary | ICD-10-CM

## 2018-02-25 ENCOUNTER — Other Ambulatory Visit: Payer: Self-pay | Admitting: Family Medicine

## 2018-02-25 DIAGNOSIS — E559 Vitamin D deficiency, unspecified: Secondary | ICD-10-CM

## 2018-03-22 ENCOUNTER — Ambulatory Visit (INDEPENDENT_AMBULATORY_CARE_PROVIDER_SITE_OTHER): Payer: 59 | Admitting: Family Medicine

## 2018-03-22 ENCOUNTER — Encounter: Payer: Self-pay | Admitting: Family Medicine

## 2018-03-22 VITALS — BP 130/80 | HR 95 | Temp 97.9°F | Resp 16 | Ht 69.0 in | Wt 254.8 lb

## 2018-03-22 DIAGNOSIS — K219 Gastro-esophageal reflux disease without esophagitis: Secondary | ICD-10-CM

## 2018-03-22 DIAGNOSIS — D509 Iron deficiency anemia, unspecified: Secondary | ICD-10-CM | POA: Diagnosis not present

## 2018-03-22 DIAGNOSIS — R739 Hyperglycemia, unspecified: Secondary | ICD-10-CM

## 2018-03-22 DIAGNOSIS — E538 Deficiency of other specified B group vitamins: Secondary | ICD-10-CM

## 2018-03-22 DIAGNOSIS — F339 Major depressive disorder, recurrent, unspecified: Secondary | ICD-10-CM

## 2018-03-22 DIAGNOSIS — E039 Hypothyroidism, unspecified: Secondary | ICD-10-CM

## 2018-03-22 DIAGNOSIS — E785 Hyperlipidemia, unspecified: Secondary | ICD-10-CM

## 2018-03-22 DIAGNOSIS — M797 Fibromyalgia: Secondary | ICD-10-CM

## 2018-03-22 DIAGNOSIS — Z79899 Other long term (current) drug therapy: Secondary | ICD-10-CM

## 2018-03-22 MED ORDER — AMPHETAMINE-DEXTROAMPHET ER 15 MG PO CP24: 15.0000 mg | ORAL_CAPSULE | ORAL | 0 refills | Status: DC

## 2018-03-22 NOTE — Progress Notes (Signed)
Name: Debra Soto   MRN: [id number]    DOB: 03-11-1961   Date:03/22/2018       Progress Note  Subjective  Chief Complaint  Chief Complaint  Patient presents with  . Depression  . Sinusitis    HPI   Hypothyroidism: she was without medication for two weeks in December, but since mid December she has been taking it daily, mild weight change, she has chronic hair loss and dry skin  Major Depression: shewason Duloxetine but asked to go back onCitalopram and Wellbutrin08/2018.  She ran out of medication for about one week, but has been taking it most days. She is still adjusting to the move, house still having repairs and it affects her mood.   ATF:TDDUKGURKY Lyrica because it causes her to get moody. She has trigger point positive . She states she is always is pain, tramadol did not work in the past so she has been taking Aleve for aches and pains, explained drop of GFR back in Summer 2019.  Discussed Tylenol instead and try to exercise more.    Insomnia: took Trazodone but does not want to go back at this time.Hydroxyzine also made her tired, she is taking melatonin now and states seems to help. She states sometimes she sleeps for a long time , states her sleep pattern is not normal   Dyslipidemia: LDL is 140, seen by Dr. Beverely Pace a normal stress test, heart rate can go up intermittently/ Reviewed last labs with patient, we will recheck in 6 months .   GERD: on Omeprazol and stable, no heartburn or indigestion with medication, she is aware of long term risk of PPI. Still unable to wean self off   Morbid obesity:weight went up 4 lbs since last visit, she states some days she is not hungry but other days unable to stop eating.   Anemia iron deficiency: seeing hematologist, had two rounds of iron infusion. Ferritin went up to 16, still has follow up with hematologist. She also had low 12 and was given one injection.   Arthralgia: she has a history of elevated CRP and joint  pains, previous normal ANA and RF , mother has a history of inflammatory arthritis. She would like to hold off on recheck CRP    Patient Active Problem List   Diagnosis Date Noted  . Iron deficiency anemia secondary to inadequate dietary iron intake 11/06/2017  . Special screening for malignant neoplasms, colon   . Benign neoplasm of descending colon   . Heartburn   . Gastric polyp   . Psoriasis 01/25/2016  . 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 Endometriosis 12/31/2014  . Insomnia, persistent 09/29/2014  . IBS (irritable bowel syndrome) 09/29/2014  . 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 Dyslipidemia 09/29/2014  . Fibromyalgia syndrome 09/29/2014  . Gastro-esophageal reflux disease without esophagitis 09/29/2014  . Herpes labialis 09/29/2014  . Adult hypothyroidism 09/29/2014  . Family history of malignant neoplasm of breast 09/29/2014  . Morbid obesity, unspecified obesity type ([place]) 09/29/2014  . Major depression, recurrent, chronic ([place]) 09/29/2014  . Obstructive apnea 09/29/2014  . Abnormal presence of protein in urine 09/29/2014  . Allergic rhinitis 09/29/2014  . Vitamin D deficiency 09/29/2014  . Urge incontinence 09/29/2014  . Headache, tension-type 09/29/2014  . Menopausal symptom 09/29/2014    Past Surgical History:  Procedure Laterality Date  . ABDOMINAL HYSTERECTOMY  [id number]  . bladder tact    . BREAST BIOPSY Right 09/02/2015   US Guided biopsy - Ribbon shaped marker - negative  .  CHOLECYSTECTOMY  [id number]  . COLONOSCOPY    . COLONOSCOPY WITH PROPOFOL N/A 02/10/2016   Procedure: COLONOSCOPY WITH PROPOFOL;  Surgeon: Lucilla Lame, MD;  Location: [facility];  Service: Endoscopy;  Laterality: N/A;  . ESOPHAGOGASTRODUODENOSCOPY    . ESOPHAGOGASTRODUODENOSCOPY (EGD) WITH PROPOFOL N/A 02/10/2016   Procedure: ESOPHAGOGASTRODUODENOSCOPY (EGD) WITH PROPOFOL;  Surgeon: Lucilla Lame, MD;  Location: [facility];  Service: Endoscopy;  Laterality: N/A;   sleep apnea  . LIPOMA EXCISION    . POLYPECTOMY  02/10/2016   Procedure: POLYPECTOMY;  Surgeon: Lucilla Lame, MD;  Location: [facility];  Service: Endoscopy;;  . TONSILECTOMY, ADENOIDECTOMY, BILATERAL MYRINGOTOMY AND TUBES      Family History  Problem Relation Age of Onset  . Hypertension Mother   . Arthritis Mother   . Cancer Mother        ovarian,breast, lung, liver  . Breast cancer Mother 65  . Ovarian cancer Mother 19  . Lung cancer Mother 59  . Colon cancer Brother   . Other Father        lung problems  . Ovarian cancer Maternal Grandmother        pt thinks is was ovarian but not sure  . Heart disease Brother   . Cancer Maternal Grandfather     Social History   Socioeconomic History  . Marital status: Married    Spouse name: Elenore Rota  . Number of children: 2  . Years of education: Not on file  . Highest education level: 12th grade  Occupational History  . Occupation: Psychologist, occupational as Chiropodist     Comment: learning [employer]  Social Needs  . Financial resource strain: Not hard at all  . Food insecurity:    Worry: Never true    Inability: Never true  . Transportation needs:    Medical: No    Non-medical: No  Tobacco Use  . Smoking status: Never Smoker  . Smokeless tobacco: Never Used  Substance and Sexual Activity  . Alcohol use: Yes    Alcohol/week: 0.0 standard drinks    Comment: rarely  . Drug use: No  . Sexual activity: Yes    Partners: Male  Lifestyle  . Physical activity:    Days per week: 0 days    Minutes per session: 0 min  . Stress: Very much  Relationships  . Social connections:    Talks on phone: Twice a week    Gets together: More than three times a week    Attends religious service: More than 4 times per year    Active member of club or organization: No    Attends meetings of clubs or organizations: Never    Relationship status: Married  . Intimate partner violence:    Fear of current or ex partner: No    Emotionally  abused: No    Physically abused: No    Forced sexual activity: No  Other Topics Concern  . Not on file  Social History Narrative  . Not on file     Current Outpatient Medications:  .  acetaminophen (TYLENOL) 500 MG tablet, Take 1 tablet (500 mg total) by mouth every 6 (six) hours as needed., Disp: 30 tablet, Rfl: 0 .  amphetamine-dextroamphetamine (ADDERALL XR) 15 MG 24 hr capsule, Take 1 capsule by mouth every morning. Fill 02/09/2018, Disp: 30 capsule, Rfl: 0 .  betamethasone dipropionate (DIPROLENE) 0.05 % cream, apply A THIN LAYER to affected area twice a day UNTIL CLEAR, Disp: , Rfl: 0 .  buPROPion (WELLBUTRIN XL) 150 MG 24 hr tablet, TAKE 1 TABLET(150 MG) BY MOUTH DAILY, Disp: 90 tablet, Rfl: 1 .  Ca Phosphate-Cholecalciferol (CALCIUM/VITAMIN D3 GUMMIES) 200-200 MG-UNIT CHEW, Chew 1 each by mouth daily., Disp: , Rfl:  .  citalopram (CELEXA) 20 MG tablet, TAKE 1 TABLET(20 MG) BY MOUTH DAILY, Disp: 90 tablet, Rfl: 1 .  levothyroxine (SYNTHROID, LEVOTHROID) 200 MCG tablet, TAKE 1 TABLET BY MOUTH EVERY DAY BEFORE BREAKFAST, Disp: 90 tablet, Rfl: 0 .  omeprazole (PRILOSEC) 20 MG capsule, take 1 capsule by mouth twice a day BEFORE A MEAL, Disp: 180 capsule, Rfl: 1 .  valACYclovir (VALTREX) 1000 MG tablet, TAKE 1 TABLET(1000 MG) BY MOUTH TWICE DAILY AS NEEDED, Disp: 30 tablet, Rfl: 0 .  hydrOXYzine (ATARAX/VISTARIL) 25 MG tablet, , Disp: , Rfl: 0  No Known Allergies  I personally reviewed active problem list, medication list, allergies, family history, social history with the patient/caregiver today.   ROS  Constitutional: Negative for fever or significant weight change.  Respiratory: Negative for cough and shortness of breath.   Cardiovascular: Negative for chest pain or palpitations.  Gastrointestinal: Negative for abdominal pain, no bowel changes.  Musculoskeletal: Negative for gait problem or joint swelling.  Skin: Negative for rash.  Neurological: Negative for dizziness or  headache.  No other specific complaints in a complete review of systems (except as listed in HPI above).  Objective  Vitals:   03/22/18 0834  BP: 130/80  Pulse: 95  Resp: 16  Temp: 97.9 F (36.6 C)  TempSrc: Oral  SpO2: 97%  Weight: 254 lb 12.8 oz (115.6 kg)  Height: 5\' 9"  (1.753 m)    Body mass index is 37.63 kg/m.  Physical Exam  Constitutional: Patient appears well-developed and well-nourished. Obese  No distress.  HEENT: head atraumatic, normocephalic, pupils equal and reactive to light,  neck supple, throat within normal limits Cardiovascular: Normal rate, regular rhythm and normal heart sounds.  No murmur heard. No BLE edema. Pulmonary/Chest: Effort normal and breath sounds normal. No respiratory distress. Abdominal: Soft.  There is no tenderness. Psychiatric: Patient has a normal mood and affect. behavior is normal. Judgment and thought content normal.  Recent Results (from the past 2160 hour(s))  CBC with Differential (Cancer Center Only)     Status: Abnormal   Collection Time: 01/02/18  8:30 AM  Result Value Ref Range   WBC Count 7.4 4.0 - 10.5 K/uL   RBC 5.11 3.87 - 5.11 MIL/uL   Hemoglobin 12.7 12.0 - 15.0 g/dL   HCT 42.8 36.0 - 46.0 %   MCV 83.8 80.0 - 100.0 fL   MCH 24.9 (L) 26.0 - 34.0 pg   MCHC 29.7 (L) 30.0 - 36.0 g/dL   RDW 23.3 (H) 11.5 - 15.5 %   Platelet Count 304 150 - 400 K/uL   nRBC 0.0 0.0 - 0.2 %   Neutrophils Relative % 65 %   Neutro Abs 4.8 1.7 - 7.7 K/uL   Lymphocytes Relative 27 %   Lymphs Abs 2.0 0.7 - 4.0 K/uL   Monocytes Relative 7 %   Monocytes Absolute 0.5 0.1 - 1.0 K/uL   Eosinophils Relative 0 %   Eosinophils Absolute 0.0 0.0 - 0.5 K/uL   Basophils Relative 1 %   Basophils Absolute 0.1 0.0 - 0.1 K/uL   Immature Granulocytes 0 %   Abs Immature Granulocytes 0.01 0.00 - 0.07 K/uL    Comment: Performed at [facility], [address] [place], [place] [address]  Iron and TIBC     Status: Abnormal    Collection Time: 01/02/18  8:31 AM  Result Value Ref Range   Iron 36 (L) 41 - 142 ug/dL   TIBC 309 236 - 444 ug/dL   Saturation Ratios 12 (L) 21 - 57 %   UIBC 273 ug/dL    Comment: Performed at [facility], [place] [address], [place], [place] [address]  Ferritin     Status: None   Collection Time: 01/02/18  8:31 AM  Result Value Ref Range   Ferritin 16 11 - 307 ng/mL    Comment: Performed at [facility], [place] [address] [place], [place] [address]      PHQ2/9: Depression screen [facility] 2/9 03/22/2018 12/11/2017 10/04/2017 07/06/2017 12/18/2016  Decreased Interest 0 1 2 0 0  Down, Depressed, Hopeless 1 1 1 1  0  PHQ - 2 Score 1 2 3 1  0  Altered sleeping 3 3 3 2  -  Tired, decreased energy 2 3 3 2  -  Change in appetite 3 3 3 2  -  Feeling bad or failure about yourself  0 1 1 1  -  Trouble concentrating 2 2 3 3  -  Moving slowly or fidgety/restless 0 0 0 1 -  Suicidal thoughts 0 0 0 0 -  PHQ-9 Score 11 14 16 12  -  Difficult doing work/chores Somewhat difficult - Very difficult Somewhat difficult -     Fall Risk: Fall Risk  03/22/2018 12/11/2017 10/04/2017 07/06/2017 12/18/2016  Falls in the past year? 0 Yes No No Yes  Number falls in past yr: - 2 or more - - 1  Injury with Fall? 0 Yes - - No  Comment - Bruised her ribs-fell from a ladder - - -     Assessment & Plan  1. Major depression, recurrent, chronic (HCC)  She states feeling a little better, and does not want to change medication   2. B12 deficiency  - B12 and Folate Panel  3. Adult hypothyroidism  - TSH  4. Iron deficiency anemia, unspecified iron deficiency anemia type  Keep follow up with hematologist   5. Long-term use of high-risk medication  - COMPLETE METABOLIC PANEL WITH GFR   6. Gastro-esophageal reflux disease without esophagitis  Still on PPI   7. Fibromyalgia syndrome  - amphetamine-dextroamphetamine (ADDERALL XR) 15 MG 24 hr capsule; Take 1 capsule by  mouth every morning.  Dispense: 30 capsule; Refill: 0 - amphetamine-dextroamphetamine (ADDERALL XR) 15 MG 24 hr capsule; Take 1 capsule by mouth every morning.  Dispense: 30 capsule; Refill: 0 - amphetamine-dextroamphetamine (ADDERALL XR) 15 MG 24 hr capsule; Take 1 capsule by mouth every morning.  Dispense: 30 capsule; Refill: 0  8. Morbid obesity ([place])  She has co-morbidities such as arthralgia, GERD, dyslipidemia Discussed with the patient the risk posed by an increased BMI. Discussed importance of portion control, calorie counting and at least 150 minutes of physical activity weekly. Avoid sweet beverages and drink more water. Eat at least 6 servings of fruit and vegetables daily   9. Dyslipidemia  Recheck labs next visit   10. Hyperglycemia  Recheck labs next visit

## 2018-03-23 LAB — COMPLETE METABOLIC PANEL WITH GFR
AG Ratio: 1.1 (calc) (ref 1.0–2.5)
ALT: 17 U/L (ref 6–29)
AST: 14 U/L (ref 10–35)
Albumin: 3.7 g/dL (ref 3.6–5.1)
Alkaline phosphatase (APISO): 122 U/L (ref 33–130)
BUN: 12 mg/dL (ref 7–25)
CO2: 31 mmol/L (ref 20–32)
Calcium: 9.1 mg/dL (ref 8.6–10.4)
Chloride: 104 mmol/L (ref 98–110)
Creat: 0.93 mg/dL (ref 0.50–1.05)
GFR, Est African American: 79 mL/min/{1.73_m2} (ref >=60)
GFR, Est Non African American: 68 mL/min/{1.73_m2} (ref >=60)
Globulin: 3.4 g/dL (calc) (ref 1.9–3.7)
Glucose, Bld: 73 mg/dL (ref 65–99)
Potassium: 4.4 mmol/L (ref 3.5–5.3)
Sodium: 139 mmol/L (ref 135–146)
Total Bilirubin: 0.4 mg/dL (ref 0.2–1.2)
Total Protein: 7.1 g/dL (ref 6.1–8.1)

## 2018-03-23 LAB — B12 AND FOLATE PANEL
FOLATE: 7.1 ng/mL
VITAMIN B 12: 459 pg/mL (ref 200–1100)

## 2018-03-23 LAB — TSH: TSH: 2.44 mIU/L (ref 0.40–4.50)

## 2018-03-28 ENCOUNTER — Telehealth: Payer: Self-pay | Admitting: Internal Medicine

## 2018-03-28 NOTE — Telephone Encounter (Signed)
Called patient per MM - r/s 1/27 appt to following week - left  Message for patient and sent reminder letter in the mail.

## 2018-04-05 ENCOUNTER — Other Ambulatory Visit: Payer: Self-pay | Admitting: Family Medicine

## 2018-04-05 DIAGNOSIS — M797 Fibromyalgia: Secondary | ICD-10-CM

## 2018-04-05 NOTE — Telephone Encounter (Signed)
Refill request for general medication. Pain Reliever  Last office visit 03/22/2018  Follow up on 06/19/2018

## 2018-04-08 ENCOUNTER — Ambulatory Visit: Payer: 59 | Admitting: Internal Medicine

## 2018-04-08 ENCOUNTER — Other Ambulatory Visit: Payer: 59

## 2018-04-16 ENCOUNTER — Inpatient Hospital Stay: Payer: 59 | Admitting: Internal Medicine

## 2018-04-16 ENCOUNTER — Inpatient Hospital Stay: Payer: 59 | Attending: Family Medicine

## 2018-04-20 ENCOUNTER — Other Ambulatory Visit: Payer: Self-pay | Admitting: Family Medicine

## 2018-04-20 DIAGNOSIS — K219 Gastro-esophageal reflux disease without esophagitis: Secondary | ICD-10-CM

## 2018-06-02 ENCOUNTER — Other Ambulatory Visit: Payer: Self-pay | Admitting: Family Medicine

## 2018-06-02 DIAGNOSIS — E559 Vitamin D deficiency, unspecified: Secondary | ICD-10-CM

## 2018-06-19 ENCOUNTER — Encounter: Payer: Self-pay | Admitting: Family Medicine

## 2018-06-19 ENCOUNTER — Other Ambulatory Visit: Payer: Self-pay

## 2018-06-19 ENCOUNTER — Ambulatory Visit (INDEPENDENT_AMBULATORY_CARE_PROVIDER_SITE_OTHER): Payer: 59 | Admitting: Family Medicine

## 2018-06-19 ENCOUNTER — Other Ambulatory Visit: Payer: Self-pay | Admitting: Family Medicine

## 2018-06-19 VITALS — Temp 97.1°F | Wt 249.0 lb

## 2018-06-19 DIAGNOSIS — E785 Hyperlipidemia, unspecified: Secondary | ICD-10-CM

## 2018-06-19 DIAGNOSIS — K219 Gastro-esophageal reflux disease without esophagitis: Secondary | ICD-10-CM

## 2018-06-19 DIAGNOSIS — E559 Vitamin D deficiency, unspecified: Secondary | ICD-10-CM | POA: Diagnosis not present

## 2018-06-19 DIAGNOSIS — F339 Major depressive disorder, recurrent, unspecified: Secondary | ICD-10-CM

## 2018-06-19 DIAGNOSIS — M797 Fibromyalgia: Secondary | ICD-10-CM

## 2018-06-19 DIAGNOSIS — E039 Hypothyroidism, unspecified: Secondary | ICD-10-CM

## 2018-06-19 MED ORDER — LEVOTHYROXINE SODIUM 200 MCG PO TABS: ORAL_TABLET | ORAL | 0 refills | Status: DC

## 2018-06-19 MED ORDER — AMPHETAMINE-DEXTROAMPHET ER 15 MG PO CP24: 15.0000 mg | ORAL_CAPSULE | ORAL | 0 refills | Status: DC

## 2018-06-19 MED ORDER — OMEPRAZOLE 20 MG PO CPDR: DELAYED_RELEASE_CAPSULE | ORAL | 1 refills | Status: DC

## 2018-06-19 MED ORDER — BUPROPION HCL ER (XL) 150 MG PO TB24: ORAL_TABLET | ORAL | 0 refills | Status: DC

## 2018-06-19 MED ORDER — CITALOPRAM HYDROBROMIDE 20 MG PO TABS: ORAL_TABLET | ORAL | 0 refills | Status: DC

## 2018-06-19 NOTE — Progress Notes (Signed)
Name: Debra Soto   MRN: [id number]    DOB: 1961/02/21   Date:06/19/2018       Progress Note  Subjective  Chief Complaint  Chief Complaint  Patient presents with  . Fibromyalgia    Has had increased joint pain do to arthritis and fibromyalgia since the change in weather.  . Medication Refill    I connected with@ on 06/19/18 at  9:40 AM EDT by a video enabled telemedicine application and verified that I am speaking with the correct person using two identifiers.  I discussed the limitations of evaluation and management by telemedicine and the availability of in person appointments. The patient expressed understanding and agreed to proceed. Staff also discussed with the patient that there may be a patient responsible charge related to this service. Patient Location: at home  Provider Location: [facility] Additional Individuals present:none   HPI  Hypothyroidism: she has been taking medication consistently , when she skips she doubles the dose the following day, she has chronic hair loss and dry skin  Major Depression: shewason Duloxetine but asked to go back onCitalopram and Wellbutrin08/2018.  She states she is trying to stay busy at home, carrying for her 11 puppies and is staying busy in her yard.     OVF:IEPPIRJJOA Lyrica because it causes her to get moody. She states she is always is pain, she is trying to move around and walk to control symptoms , tramadol did not work in the past so she has been taking Aleve for aches and pains, explained drop of GFR back in Summer 2019, last GFR improved . She is now very seldom taking NSAID's, taking more Tylenol   Insomnia: took Trazodone but does not want to go back at this time.Hydroxyzine also made her tired, she is taking melatonin now and states seems to help.  Dyslipidemia: LDL was 122 , seen by Dr. Beverely Pace a normal stress test, heart rate can go up intermittently, but not lately, no chest pain   The  10-year ASCVD risk score Mikey Bussing DC Brooke Bonito., et al., 2013) is: 3.2%   Values used to calculate the score:     Age: 58 years     Sex: Female     Is Non-Hispanic African American: No     Diabetic: No     Tobacco smoker: No     Systolic Blood Pressure: 416 mmHg     Is BP treated: No     HDL Cholesterol: 45 mg/dL     Total Cholesterol: 195 mg/dL  GERD: on Omeprazol and stable, no heartburn or indigestion with medication, she is aware of long term risk of PPI. Still unable to wean self off . Unchanged   Morbid obesity:she states eating more since COVID-19, discussed mindfulness.     Patient Active Problem List   Diagnosis Date Noted  . Iron deficiency anemia secondary to inadequate dietary iron intake 11/06/2017  . Special screening for malignant neoplasms, colon   . Benign neoplasm of descending colon   . Heartburn   . Gastric polyp   . Psoriasis 01/25/2016  . 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 Endometriosis 12/31/2014  . Insomnia, persistent 09/29/2014  . IBS (irritable bowel syndrome) 09/29/2014  . 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 Dyslipidemia 09/29/2014  . Fibromyalgia syndrome 09/29/2014  . Gastro-esophageal reflux disease without esophagitis 09/29/2014  . Herpes labialis 09/29/2014  . Adult hypothyroidism 09/29/2014  . Family history of malignant neoplasm of breast 09/29/2014  . Morbid obesity, unspecified obesity  type ([place]) 09/29/2014  . Major depression, recurrent, chronic ([place]) 09/29/2014  . Obstructive apnea 09/29/2014  . Abnormal presence of protein in urine 09/29/2014  . Allergic rhinitis 09/29/2014  . Vitamin D deficiency 09/29/2014  . Urge incontinence 09/29/2014  . Headache, tension-type 09/29/2014  . Menopausal symptom 09/29/2014    Past Surgical History:  Procedure Laterality Date  . ABDOMINAL HYSTERECTOMY  [id number]  . bladder tact    . BREAST BIOPSY Right 09/02/2015   US Guided biopsy - Ribbon shaped marker - negative  . CHOLECYSTECTOMY   [id number]  . COLONOSCOPY    . COLONOSCOPY WITH PROPOFOL N/A 02/10/2016   Procedure: COLONOSCOPY WITH PROPOFOL;  Surgeon: Lucilla Lame, MD;  Location: [facility];  Service: Endoscopy;  Laterality: N/A;  . ESOPHAGOGASTRODUODENOSCOPY    . ESOPHAGOGASTRODUODENOSCOPY (EGD) WITH PROPOFOL N/A 02/10/2016   Procedure: ESOPHAGOGASTRODUODENOSCOPY (EGD) WITH PROPOFOL;  Surgeon: Lucilla Lame, MD;  Location: [facility];  Service: Endoscopy;  Laterality: N/A;  sleep apnea  . LIPOMA EXCISION    . POLYPECTOMY  02/10/2016   Procedure: POLYPECTOMY;  Surgeon: Lucilla Lame, MD;  Location: [facility];  Service: Endoscopy;;  . TONSILECTOMY, ADENOIDECTOMY, BILATERAL MYRINGOTOMY AND TUBES      Family History  Problem Relation Age of Onset  . Hypertension Mother   . Arthritis Mother   . Cancer Mother        ovarian,breast, lung, liver  . Breast cancer Mother 16  . Ovarian cancer Mother 31  . Lung cancer Mother 55  . Colon cancer Brother   . Other Father        lung problems  . Ovarian cancer Maternal Grandmother        pt thinks is was ovarian but not sure  . Heart disease Brother   . Cancer Maternal Grandfather     Social History   Socioeconomic History  . Marital status: Married    Spouse name: Elenore Rota  . Number of children: 2  . Years of education: Not on file  . Highest education level: 12th grade  Occupational History  . Occupation: Psychologist, occupational as Chiropodist     Comment: learning [employer]  Social Needs  . Financial resource strain: Not hard at all  . Food insecurity:    Worry: Never true    Inability: Never true  . Transportation needs:    Medical: No    Non-medical: No  Tobacco Use  . Smoking status: Never Smoker  . Smokeless tobacco: Never Used  Substance and Sexual Activity  . Alcohol use: Yes    Alcohol/week: 0.0 standard drinks    Comment: rarely  . Drug use: No  . Sexual activity: Yes    Partners: Male  Lifestyle  . Physical activity:     Days per week: 0 days    Minutes per session: 0 min  . Stress: Very much  Relationships  . Social connections:    Talks on phone: Twice a week    Gets together: More than three times a week    Attends religious service: More than 4 times per year    Active member of club or organization: No    Attends meetings of clubs or organizations: Never    Relationship status: Married  . Intimate partner violence:    Fear of current or ex partner: No    Emotionally abused: No    Physically abused: No    Forced sexual activity: No  Other Topics Concern  . Not on file  Social History Narrative  . Not on file     Current Outpatient Medications:  .  amphetamine-dextroamphetamine (ADDERALL XR) 15 MG 24 hr capsule, Take 1 capsule by mouth every morning., Disp: 30 capsule, Rfl: 0 .  amphetamine-dextroamphetamine (ADDERALL XR) 15 MG 24 hr capsule, Take 1 capsule by mouth every morning., Disp: 30 capsule, Rfl: 0 .  amphetamine-dextroamphetamine (ADDERALL XR) 15 MG 24 hr capsule, Take 1 capsule by mouth every morning., Disp: 30 capsule, Rfl: 0 .  betamethasone dipropionate (DIPROLENE) 0.05 % cream, apply A THIN LAYER to affected area twice a day UNTIL CLEAR, Disp: , Rfl: 0 .  buPROPion (WELLBUTRIN XL) 150 MG 24 hr tablet, TAKE 1 TABLET(150 MG) BY MOUTH DAILY, Disp: 90 tablet, Rfl: 1 .  Ca Phosphate-Cholecalciferol (CALCIUM/VITAMIN D3 GUMMIES) 200-200 MG-UNIT CHEW, Chew 1 each by mouth daily., Disp: , Rfl:  .  citalopram (CELEXA) 20 MG tablet, TAKE 1 TABLET(20 MG) BY MOUTH DAILY, Disp: 90 tablet, Rfl: 1 .  levothyroxine (SYNTHROID, LEVOTHROID) 200 MCG tablet, TAKE 1 TABLET BY MOUTH EVERY DAY BEFORE BREAKFAST, Disp: 90 tablet, Rfl: 0 .  omeprazole (PRILOSEC) 20 MG capsule, TAKE 1 CAPSULE BY MOUTH TWICE DAILY, BEFORE A MEAL, Disp: 180 capsule, Rfl: 0 .  PAIN RELIEVER EXTRA STRENGTH 500 MG tablet, TAKE 1 TABLET(500 MG) BY MOUTH EVERY 6 HOURS AS NEEDED, Disp: 30 tablet, Rfl: 0 .  valACYclovir (VALTREX) 1000  MG tablet, TAKE 1 TABLET(1000 MG) BY MOUTH TWICE DAILY AS NEEDED, Disp: 30 tablet, Rfl: 0  No Known Allergies  I personally reviewed active problem list, medication list, allergies, family history, social history with the patient/caregiver today.   ROS  Constitutional: Negative for fever or weight change.  Respiratory: Negative for cough and shortness of breath.   Cardiovascular: Negative for chest pain or palpitations.  Gastrointestinal: Negative for abdominal pain, no bowel changes.  Musculoskeletal: Negative for gait problem or joint swelling.  Skin: Negative for rash.  Neurological: Negative for dizziness or headache.  No other specific complaints in a complete review of systems (except as listed in HPI above).  Objective  Virtual encounter, vitals not obtained.  Body mass index is 36.77 kg/m.  Physical Exam  Awake, alert, well groomed    PHQ2/9: Depression screen [facility] 2/9 06/19/2018 03/22/2018 12/11/2017 10/04/2017 07/06/2017  Decreased Interest 0 0 1 2 0  Down, Depressed, Hopeless 1 1 1 1 1   PHQ - 2 Score 1 1 2 3 1   Altered sleeping 3 3 3 3 2   Tired, decreased energy 2 2 3 3 2   Change in appetite 1 3 3 3 2   Feeling bad or failure about yourself  0 0 1 1 1   Trouble concentrating 1 2 2 3 3   Moving slowly or fidgety/restless 1 0 0 0 1  Suicidal thoughts 0 0 0 0 0  PHQ-9 Score 9 11 14 16 12   Difficult doing work/chores Somewhat difficult Somewhat difficult - Very difficult Somewhat difficult   PHQ-2/9 Result is negative.    Fall Risk: Fall Risk  06/19/2018 03/22/2018 12/11/2017 10/04/2017 07/06/2017  Falls in the past year? 0 0 Yes No No  Number falls in past yr: 0 - 2 or more - -  Injury with Fall? 0 0 Yes - -  Comment - - Bruised her ribs-fell from a ladder - -     Assessment & Plan  1. Major depression, recurrent, chronic (HCC)  - amphetamine-dextroamphetamine (ADDERALL XR) 15 MG 24 hr capsule; Take 1 capsule by mouth every  morning.  Dispense: 30 capsule; Refill: 0  - amphetamine-dextroamphetamine (ADDERALL XR) 15 MG 24 hr capsule; Take 1 capsule by mouth every morning.  Dispense: 30 capsule; Refill: 0 - amphetamine-dextroamphetamine (ADDERALL XR) 15 MG 24 hr capsule; Take 1 capsule by mouth every morning.  Dispense: 30 capsule; Refill: 0 - buPROPion (WELLBUTRIN XL) 150 MG 24 hr tablet; TAKE 1 TABLET(150 MG) BY MOUTH DAILY  Dispense: 90 tablet; Refill: 1 - citalopram (CELEXA) 20 MG tablet; TAKE 1 TABLET(20 MG) BY MOUTH DAILY  Dispense: 90 tablet; Refill: 1  2. Fibromyalgia syndrome  - amphetamine-dextroamphetamine (ADDERALL XR) 15 MG 24 hr capsule; Take 1 capsule by mouth every morning.  Dispense: 30 capsule; Refill: 0 - amphetamine-dextroamphetamine (ADDERALL XR) 15 MG 24 hr capsule; Take 1 capsule by mouth every morning.  Dispense: 30 capsule; Refill: 0 - amphetamine-dextroamphetamine (ADDERALL XR) 15 MG 24 hr capsule; Take 1 capsule by mouth every morning.  Dispense: 30 capsule; Refill: 0  3. Vitamin D deficiency  - levothyroxine (SYNTHROID, LEVOTHROID) 200 MCG tablet; TAKE 1 TABLET BY MOUTH EVERY DAY BEFORE BREAKFAST  Dispense: 90 tablet; Refill: 1  4. Gastro-esophageal reflux disease without esophagitis  - omeprazole (PRILOSEC) 20 MG capsule; TAKE 1 CAPSULE BY MOUTH TWICE DAILY, BEFORE A MEAL  Dispense: 180 capsule; Refill: 1   I discussed the assessment and treatment plan with the patient. The patient was provided an opportunity to ask questions and all were answered. The patient agreed with the plan and demonstrated an understanding of the instructions.  The patient was advised to call back or seek an in-person evaluation if the symptoms worsen or if the condition fails to improve as anticipated.  I provided 25 minutes of non-face-to-face time during this encounter.

## 2018-10-05 ENCOUNTER — Other Ambulatory Visit: Payer: Self-pay | Admitting: Family Medicine

## 2018-10-05 DIAGNOSIS — F339 Major depressive disorder, recurrent, unspecified: Secondary | ICD-10-CM

## 2018-10-07 NOTE — Telephone Encounter (Signed)
Pt is scheduled °

## 2018-11-06 ENCOUNTER — Ambulatory Visit (INDEPENDENT_AMBULATORY_CARE_PROVIDER_SITE_OTHER): Payer: 59 | Admitting: Family Medicine

## 2018-11-06 ENCOUNTER — Other Ambulatory Visit: Payer: Self-pay

## 2018-11-06 ENCOUNTER — Encounter: Payer: Self-pay | Admitting: Family Medicine

## 2018-11-06 DIAGNOSIS — M797 Fibromyalgia: Secondary | ICD-10-CM

## 2018-11-06 DIAGNOSIS — E785 Hyperlipidemia, unspecified: Secondary | ICD-10-CM

## 2018-11-06 DIAGNOSIS — E559 Vitamin D deficiency, unspecified: Secondary | ICD-10-CM

## 2018-11-06 DIAGNOSIS — F339 Major depressive disorder, recurrent, unspecified: Secondary | ICD-10-CM

## 2018-11-06 DIAGNOSIS — E538 Deficiency of other specified B group vitamins: Secondary | ICD-10-CM | POA: Diagnosis not present

## 2018-11-06 DIAGNOSIS — E039 Hypothyroidism, unspecified: Secondary | ICD-10-CM | POA: Diagnosis not present

## 2018-11-06 DIAGNOSIS — R739 Hyperglycemia, unspecified: Secondary | ICD-10-CM

## 2018-11-06 DIAGNOSIS — Z79899 Other long term (current) drug therapy: Secondary | ICD-10-CM

## 2018-11-06 DIAGNOSIS — Z23 Encounter for immunization: Secondary | ICD-10-CM

## 2018-11-06 DIAGNOSIS — M2550 Pain in unspecified joint: Secondary | ICD-10-CM

## 2018-11-06 DIAGNOSIS — D509 Iron deficiency anemia, unspecified: Secondary | ICD-10-CM

## 2018-11-06 MED ORDER — AMPHETAMINE-DEXTROAMPHET ER 15 MG PO CP24: 15.0000 mg | ORAL_CAPSULE | ORAL | 0 refills | Status: DC

## 2018-11-06 MED ORDER — ARIPIPRAZOLE 2 MG PO TABS: 2.0000 mg | ORAL_TABLET | Freq: Every day | ORAL | 1 refills | Status: DC

## 2018-11-06 NOTE — Progress Notes (Signed)
Name: Debra [place]   MRN: [id number]    DOB: 03-08-1961   Date:11/06/2018       Progress Note  Subjective  Chief Complaint  Chief Complaint  Patient presents with   Medication Refill   Depression   Hypothyroidism   FMS    Worst in the morning   Insomnia    Has not been sleeping at all- sleeping 2 hour nightly on average   Dyslipidemia    I connected with  Amedeo Kinsman on 11/06/18 at 11:40 AM EDT by telephone and verified that I am speaking with the correct person using two identifiers.  I discussed the limitations, risks, security and privacy concerns of performing an evaluation and management service by telephone and the availability of in person appointments. Staff also discussed with the patient that there may be a patient responsible charge related to this service. Patient Location: at home  Provider Location: [facility]   HPI  Hypothyroidism: she has been taking medication consistently ,  she has chronic hair loss and dry skin, she has been feeling extremely tired   Major Depression: shewason Duloxetine but asked to go back onCitalopram and Wellbutrin08/2018, she has not been able to sleep well at night, takes long naps during the day, phq9 is much higher. Feels guilty at night for sleeping during the day. Discussed adding Abilify low dose, importance of routine, getting up at 7 am, and taking at most one hour nap and being up no later than 2 pm.   [id number] Lyrica because it caused her to get moody. She states she is always is pain, she has not been as active lately, pain has intensified, sleeping during the day and up at night, disrupted sleep and feeling more tired than usual   Insomnia: took Trazodone but does not want to go back at this time.Hydroxyzine also made her tired, she was taking melatonin but not consistently, she is having difficulty staying asleep, states husband wakes her up because he snores and goes to bed after her,  sleeping in the living room most nights.   Dyslipidemia: LDL was 122 , seen by Dr. Beverely Pace a normal stress test, heart rate can go up intermittently, but not lately, no chest pain , she will return later today for labs  GERD: on Omeprazol and stable, no heartburn or indigestion with medication, she is aware of long term risk of PPI. Still unable to wean self off. Unchanged  History of B12 deficiency and iron deficiency anemia and would like to have labs repeated    Patient Active Problem List   Diagnosis Date Noted   Iron deficiency anemia secondary to inadequate dietary iron intake 11/06/2017   Special screening for malignant neoplasms, colon    Benign neoplasm of descending colon    Heartburn    Gastric polyp    Psoriasis 01/25/2016   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/22/2015   Endometriosis 12/31/2014   Insomnia, persistent 09/29/2014   IBS (irritable bowel syndrome) 09/29/2014   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/2014   Dyslipidemia 09/29/2014   Fibromyalgia syndrome 09/29/2014   Gastro-esophageal reflux disease without esophagitis 09/29/2014   Herpes labialis 09/29/2014   Adult hypothyroidism 09/29/2014   Family history of malignant neoplasm of breast 09/29/2014   Morbid obesity, unspecified obesity type ([place]) 09/29/2014   Major depression, recurrent, chronic ([place]) 09/29/2014   Obstructive apnea 09/29/2014   Abnormal presence of protein in urine 09/29/2014   Allergic rhinitis 09/29/2014   Vitamin D deficiency 09/29/2014   Urge  incontinence 09/29/2014   Headache, tension-type 09/29/2014   Menopausal symptom 09/29/2014    Past Surgical History:  Procedure Laterality Date   ABDOMINAL HYSTERECTOMY  [id number]   bladder tact     BREAST BIOPSY Right 09/02/2015   US Guided biopsy - Ribbon shaped marker - negative   CHOLECYSTECTOMY  [id number]   COLONOSCOPY     COLONOSCOPY WITH PROPOFOL N/A 02/10/2016   Procedure: COLONOSCOPY WITH PROPOFOL;   Surgeon: Lucilla Lame, MD;  Location: [place];  Service: Endoscopy;  Laterality: N/A;   ESOPHAGOGASTRODUODENOSCOPY     ESOPHAGOGASTRODUODENOSCOPY (EGD) WITH PROPOFOL N/A 02/10/2016   Procedure: ESOPHAGOGASTRODUODENOSCOPY (EGD) WITH PROPOFOL;  Surgeon: Lucilla Lame, MD;  Location: [place];  Service: Endoscopy;  Laterality: N/A;  sleep apnea   LIPOMA EXCISION     POLYPECTOMY  02/10/2016   Procedure: POLYPECTOMY;  Surgeon: Lucilla Lame, MD;  Location: [place];  Service: Endoscopy;;   TONSILECTOMY, ADENOIDECTOMY, BILATERAL MYRINGOTOMY AND TUBES      Family History  Problem Relation Age of Onset   Hypertension Mother    Arthritis Mother    Cancer Mother        ovarian,breast, lung, liver   Breast cancer Mother 8   Ovarian cancer Mother 80   Lung cancer Mother 60   Colon cancer Brother    Other Father        lung problems   Ovarian cancer Maternal Grandmother        pt thinks is was ovarian but not sure   Heart disease Brother    Cancer Maternal Grandfather     Social History   Socioeconomic History   Marital status: Married    Spouse name: Elenore Rota   Number of children: 2   Years of education: Not on file   Highest education level: 12th grade  Occupational History   Occupation: Psychologist, occupational as Chiropodist     Comment: Location manager strain: Not hard at all   Food insecurity    Worry: Never true    Inability: Never true   Transportation needs    Medical: No    Non-medical: No  Tobacco Use   Smoking status: Never Smoker   Smokeless tobacco: Never Used  Substance and Sexual Activity   Alcohol use: Yes    Alcohol/week: 0.0 standard drinks    Comment: rarely   Drug use: No   Sexual activity: Yes    Partners: Male  Lifestyle   Physical activity    Days per week: 0 days    Minutes per session: 0 min   Stress: Very much  Relationships   Social connections     Talks on phone: Twice a week    Gets together: More than three times a week    Attends religious service: More than 4 times per year    Active member of club or organization: No    Attends meetings of clubs or organizations: Never    Relationship status: Married   Intimate partner violence    Fear of current or ex partner: No    Emotionally abused: No    Physically abused: No    Forced sexual activity: No  Other Topics Concern   Not on file  Social History Narrative   Not on file     Current Outpatient Medications:    amphetamine-dextroamphetamine (ADDERALL XR) 15 MG 24 hr capsule, Take 1 capsule by mouth every morning., Disp: 30 capsule, Rfl: 0  amphetamine-dextroamphetamine (ADDERALL XR) 15 MG 24 hr capsule, Take 1 capsule by mouth every morning., Disp: 30 capsule, Rfl: 0   amphetamine-dextroamphetamine (ADDERALL XR) 15 MG 24 hr capsule, Take 1 capsule by mouth every morning., Disp: 30 capsule, Rfl: 0   buPROPion (WELLBUTRIN XL) 150 MG 24 hr tablet, TAKE 1 TABLET(150 MG) BY MOUTH DAILY, Disp: 90 tablet, Rfl: 0   Ca Phosphate-Cholecalciferol (CALCIUM/VITAMIN D3 GUMMIES) 200-200 MG-UNIT CHEW, Chew 1 each by mouth daily., Disp: , Rfl:    citalopram (CELEXA) 20 MG tablet, TAKE 1 TABLET(20 MG) BY MOUTH DAILY, Disp: 90 tablet, Rfl: 0   levothyroxine (SYNTHROID, LEVOTHROID) 200 MCG tablet, TAKE 1 TABLET BY MOUTH EVERY DAY BEFORE BREAKFAST, Disp: 90 tablet, Rfl: 0   omeprazole (PRILOSEC) 20 MG capsule, TAKE 1 CAPSULE BY MOUTH TWICE DAILY, BEFORE A MEAL, Disp: 180 capsule, Rfl: 1   PAIN RELIEVER EXTRA STRENGTH 500 MG tablet, TAKE 1 TABLET(500 MG) BY MOUTH EVERY 6 HOURS AS NEEDED, Disp: 30 tablet, Rfl: 0   valACYclovir (VALTREX) 1000 MG tablet, TAKE 1 TABLET(1000 MG) BY MOUTH TWICE DAILY AS NEEDED, Disp: 30 tablet, Rfl: 0   betamethasone dipropionate (DIPROLENE) 0.05 % cream, apply A THIN LAYER to affected area twice a day UNTIL CLEAR, Disp: , Rfl: 0  No Known Allergies  I  personally reviewed active problem list, medication list, allergies, family history, social history, health maintenance with the patient/caregiver today.   ROS  Ten systems reviewed and is negative except as mentioned in HPI    Objective  Virtual encounter, vitals not obtained.  There is no height or weight on file to calculate BMI.  Physical Exam  Awake, alert or oriented   PHQ2/9: Depression screen [facility] 2/9 11/06/2018 06/19/2018 03/22/2018 12/11/2017 10/04/2017  Decreased Interest 1 0 0 1 2  Down, Depressed, Hopeless 2 1 1 1 1   PHQ - 2 Score 3 1 1 2 3   Altered sleeping 3 3 3 3 3   Tired, decreased energy 3 2 2 3 3   Change in appetite 3 1 3 3 3   Feeling bad or failure about yourself  3 0 0 1 1  Trouble concentrating 3 1 2 2 3   Moving slowly or fidgety/restless 2 1 0 0 0  Suicidal thoughts 1 0 0 0 0  PHQ-9 Score 21 9 11 14 16   Difficult doing work/chores Extremely dIfficult Somewhat difficult Somewhat difficult - Very difficult  Some recent data might be hidden   PHQ-2/9 Result is positive.    Fall Risk: Fall Risk  11/06/2018 06/19/2018 03/22/2018 12/11/2017 10/04/2017  Falls in the past year? 1 0 0 Yes No  Number falls in past yr: 1 0 - 2 or more -  Comment 6 - - - -  Injury with Fall? 0 0 0 Yes -  Comment - - - Bruised her ribs-fell from a ladder -  Risk for fall due to : History of fall(s) - - - -     Assessment & Plan  1. Fibromyalgia syndrome  - amphetamine-dextroamphetamine (ADDERALL XR) 15 MG 24 hr capsule; Take 1 capsule by mouth every morning.  Dispense: 30 capsule; Refill: 0 - amphetamine-dextroamphetamine (ADDERALL XR) 15 MG 24 hr capsule; Take 1 capsule by mouth every morning.  Dispense: 30 capsule; Refill: 0 - amphetamine-dextroamphetamine (ADDERALL XR) 15 MG 24 hr capsule; Take 1 capsule by mouth every morning.  Dispense: 30 capsule; Refill: 0  2. Vitamin D deficiency  Taking supplementation   3. Adult hypothyroidism  - TSH  4. B12 deficiency  -  Vitamin B12  5. Dyslipidemia  - Lipid panel  6. Hyperglycemia  - Hemoglobin A1c  7. Arthralgia, unspecified joint  - Sedimentation rate - C-reactive protein  8. Iron deficiency anemia, unspecified iron deficiency anemia type  - CBC with Differential/Platelet  9. Major depression, recurrent, chronic (HCC)  - COMPLETE METABOLIC PANEL WITH GFR - ARIPiprazole (ABILIFY) 2 MG tablet; Take 1 tablet (2 mg total) by mouth daily.  Dispense: 30 tablet; Refill: 1 - amphetamine-dextroamphetamine (ADDERALL XR) 15 MG 24 hr capsule; Take 1 capsule by mouth every morning.  Dispense: 30 capsule; Refill: 0 - amphetamine-dextroamphetamine (ADDERALL XR) 15 MG 24 hr capsule; Take 1 capsule by mouth every morning.  Dispense: 30 capsule; Refill: 0 - amphetamine-dextroamphetamine (ADDERALL XR) 15 MG 24 hr capsule; Take 1 capsule by mouth every morning.  Dispense: 30 capsule; Refill: 0  10. Long term current use of antipsychotic medication  - COMPLETE METABOLIC PANEL WITH GFR  I discussed the assessment and treatment plan with the patient. The patient was provided an opportunity to ask questions and all were answered. The patient agreed with the plan and demonstrated an understanding of the instructions.   The patient was advised to call back or seek an in-person evaluation if the symptoms worsen or if the condition fails to improve as anticipated.  11. Needs flu shot  - she will return for flu shot   I provided 25  minutes of non-face-to-face time during this encounter.  Loistine Chance, MD

## 2018-11-22 LAB — LIPID PANEL
Cholesterol: 226 mg/dL — ABNORMAL HIGH (ref <200)
HDL: 55 mg/dL (ref >=50)
LDL Cholesterol (Calc): 149 mg/dL (calc) — ABNORMAL HIGH
Non-HDL Cholesterol (Calc): 171 mg/dL (calc) — ABNORMAL HIGH (ref <130)
Total CHOL/HDL Ratio: 4.1 (calc) (ref <5.0)
Triglycerides: 106 mg/dL (ref <150)

## 2018-11-22 LAB — CBC WITH DIFFERENTIAL/PLATELET
Absolute Monocytes: 366 cells/uL (ref 200–950)
Basophils Absolute: 69 cells/uL (ref 0–200)
Basophils Relative: 1 %
Eosinophils Absolute: 7 cells/uL — ABNORMAL LOW (ref 15–500)
Eosinophils Relative: 0.1 %
HCT: 38.6 % (ref 35.0–45.0)
Hemoglobin: 12.1 g/dL (ref 11.7–15.5)
Lymphs Abs: 1904 cells/uL (ref 850–3900)
MCH: 25.9 pg — ABNORMAL LOW (ref 27.0–33.0)
MCHC: 31.3 g/dL — ABNORMAL LOW (ref 32.0–36.0)
MCV: 82.7 fL (ref 80.0–100.0)
MPV: 10.9 fL (ref 7.5–12.5)
Monocytes Relative: 5.3 %
Neutro Abs: 4554 cells/uL (ref 1500–7800)
Neutrophils Relative %: 66 %
Platelets: 351 10*3/uL (ref 140–400)
RBC: 4.67 10*6/uL (ref 3.80–5.10)
RDW: 13.8 % (ref 11.0–15.0)
Total Lymphocyte: 27.6 %
WBC: 6.9 10*3/uL (ref 3.8–10.8)

## 2018-11-22 LAB — COMPLETE METABOLIC PANEL WITH GFR
AG Ratio: 1.1 (calc) (ref 1.0–2.5)
ALT: 14 U/L (ref 6–29)
AST: 13 U/L (ref 10–35)
Albumin: 3.8 g/dL (ref 3.6–5.1)
Alkaline phosphatase (APISO): 135 U/L (ref 37–153)
BUN: 9 mg/dL (ref 7–25)
CO2: 28 mmol/L (ref 20–32)
Calcium: 9.4 mg/dL (ref 8.6–10.4)
Chloride: 102 mmol/L (ref 98–110)
Creat: 1 mg/dL (ref 0.50–1.05)
GFR, Est African American: 72 mL/min/{1.73_m2} (ref >=60)
GFR, Est Non African American: 62 mL/min/{1.73_m2} (ref >=60)
Globulin: 3.5 g/dL (calc) (ref 1.9–3.7)
Glucose, Bld: 82 mg/dL (ref 65–99)
Potassium: 3.9 mmol/L (ref 3.5–5.3)
Sodium: 140 mmol/L (ref 135–146)
Total Bilirubin: 0.4 mg/dL (ref 0.2–1.2)
Total Protein: 7.3 g/dL (ref 6.1–8.1)

## 2018-11-22 LAB — TSH: TSH: 0.23 mIU/L — ABNORMAL LOW (ref 0.40–4.50)

## 2018-11-22 LAB — HEMOGLOBIN A1C
Hgb A1c MFr Bld: 5.4 % of total Hgb (ref <5.7)
Mean Plasma Glucose: 108 (calc)
eAG (mmol/L): 6 (calc)

## 2018-11-22 LAB — SEDIMENTATION RATE: Sed Rate: 50 mm/h — ABNORMAL HIGH (ref 0–30)

## 2018-11-22 LAB — C-REACTIVE PROTEIN: CRP: 14.7 mg/L — ABNORMAL HIGH (ref <8.0)

## 2018-11-22 LAB — VITAMIN B12: Vitamin B-12: 398 pg/mL (ref 200–1100)

## 2018-11-24 ENCOUNTER — Other Ambulatory Visit: Payer: Self-pay | Admitting: Family Medicine

## 2018-11-24 DIAGNOSIS — M2550 Pain in unspecified joint: Secondary | ICD-10-CM

## 2018-11-24 DIAGNOSIS — R5383 Other fatigue: Secondary | ICD-10-CM

## 2018-11-26 ENCOUNTER — Ambulatory Visit: Payer: Self-pay | Admitting: *Deleted

## 2018-11-26 NOTE — Telephone Encounter (Signed)
Called patient she was still at ED. She had just had EKG done. Waiting on chest x-ray and labs next.

## 2018-11-26 NOTE — Telephone Encounter (Signed)
Pt thinks she had heart attack last night. She describes having pain on her left chest, arm, and neck.  She thinks it lasted about 6 mins. She was able to check her heart rate with her phone at this time and it was in the 200's. Today she is still having trouble swallowing, states hard to describe. Feels like a lump on the side of her neck that won't go down. Her HR now is in the 80's.  She denies weakness, chest pain now. She has a hx of GERD and fibromyalgia. Advised to go to the ED to rule out a heart attack. She voiced understanding. Will route to [facility] for review.  Reason for Disposition . Patient sounds very sick or weak to the triager  Answer Assessment - Initial Assessment Questions 1. DESCRIPTION: "Please describe your heart rate or heart beat that you are having" (e.g., fast/slow, regular/irregular, skipped or extra beats, "palpitations")     Was very fast last night 2. ONSET: "When did it start?" (Minutes, hours or days)     Last night 3. DURATION: "How long does it last" (e.g., seconds, minutes, hours)     Not sure, chest pain, lasted for about 6 mins 4. PATTERN "Does it come and go, or has it been constant since it started?"  "Does it get worse with exertion?"   "Are you feeling it now?"     Sometimes it will get up to 100's  5. TAP: "Using your hand, can you tap out what you are feeling on a chair or table in front of you, so that I can hear?" (Note: not all patients can do this)       See # 5 6. HEART RATE: "Can you tell me your heart rate?" "How many beats in 15 seconds?"  (Note: not all patients can do this)       80's per phone 7. RECURRENT SYMPTOM: "Have you ever had this before?" If so, ask: "When was the last time?" and "What happened that time?"      no 8. CAUSE: "What do you think is causing the palpitations?"    ? Heart attack 9. CARDIAC HISTORY: "Do you have any history of heart disease?" (e.g., heart attack, angina, bypass surgery,  angioplasty, arrhythmia)      no 10. OTHER SYMPTOMS: "Do you have any other symptoms?" (e.g., dizziness, chest pain, sweating, difficulty breathing)       Chest pain last night 11. PREGNANCY: "Is there any chance you are pregnant?" "When was your last menstrual period?"       n/a  Protocols used: HEART RATE AND HEARTBEAT QUESTIONS-A-AH

## 2018-12-09 ENCOUNTER — Other Ambulatory Visit: Payer: Self-pay

## 2018-12-09 ENCOUNTER — Ambulatory Visit (INDEPENDENT_AMBULATORY_CARE_PROVIDER_SITE_OTHER): Payer: 59 | Admitting: Family Medicine

## 2018-12-09 ENCOUNTER — Encounter: Payer: Self-pay | Admitting: Family Medicine

## 2018-12-09 VITALS — BP 112/78 | HR 104 | Temp 96.9°F | Resp 20 | Ht 69.0 in | Wt 262.9 lb

## 2018-12-09 DIAGNOSIS — R079 Chest pain, unspecified: Secondary | ICD-10-CM

## 2018-12-09 DIAGNOSIS — E039 Hypothyroidism, unspecified: Secondary | ICD-10-CM

## 2018-12-09 DIAGNOSIS — M2550 Pain in unspecified joint: Secondary | ICD-10-CM

## 2018-12-09 DIAGNOSIS — F5101 Primary insomnia: Secondary | ICD-10-CM

## 2018-12-09 DIAGNOSIS — Z23 Encounter for immunization: Secondary | ICD-10-CM | POA: Diagnosis not present

## 2018-12-09 DIAGNOSIS — R21 Rash and other nonspecific skin eruption: Secondary | ICD-10-CM

## 2018-12-09 DIAGNOSIS — E785 Hyperlipidemia, unspecified: Secondary | ICD-10-CM | POA: Diagnosis not present

## 2018-12-09 DIAGNOSIS — R700 Elevated erythrocyte sedimentation rate: Secondary | ICD-10-CM

## 2018-12-09 DIAGNOSIS — R7982 Elevated C-reactive protein (CRP): Secondary | ICD-10-CM

## 2018-12-09 DIAGNOSIS — J9811 Atelectasis: Secondary | ICD-10-CM

## 2018-12-09 MED ORDER — DOXYCYCLINE HYCLATE 100 MG PO TABS: 100.0000 mg | ORAL_TABLET | Freq: Two times a day (BID) | ORAL | 0 refills | Status: DC

## 2018-12-09 NOTE — Progress Notes (Signed)
Name: Debra Soto   MRN: [id number]    DOB: [birth year]   Date:12/09/2018       Progress Note  Subjective  Chief Complaint  Chief Complaint  Patient presents with  . Follow-up    Depression  . Depression    States the Adderall is helping with energy but unable to sleep and still unable to get her mind from running all night  . Fibromyalgia syndrome  . Mass    Onset-1 month ago, On her buttock and inner thighs. itchy and painful    HPI  Chest pain: she woke up around 2 am on 11/26/2018 with left side chest pain that radiated to left upper arm, left side of neck and difficulty swallowing, she called out office the morning of the 09/15 and we sent her to [facility]. Cardiac enzymes was negative , normal EKG, CXR had small atelectasis and possible early developing of infiltrate. Since that night she denies any other chest pain. However she has had episodes in the past and has seen cardiologist in the past and is due for follow up again  The 10-year ASCVD risk score Mikey Bussing DC Brooke Bonito., et al., 2013) is: 2.3%   Values used to calculate the score:     Age: 32 years     Sex: Female     Is Non-Hispanic African American: No     Diabetic: No     Tobacco smoker: No     Systolic Blood Pressure: [id number] mmHg     Is BP treated: No     HDL Cholesterol: 55 mg/dL     Total Cholesterol: 226 mg/dL   Hypothyroidism: last TSH was low, decreased dose one daily plus half once a week, she will return for TSH check in about one month  Arthralgia/fatigue elevated sed rate and CRP: we will recheck labs and send her to Rheumatologist   Rash: she noticed a bumpy, itchy and burning rash on buttocks and vulva area, she states she usually has just one or two in the past, but this time is all over buttocks and some on her groin. No oozing at this time, tender to touch, using Aloe Vera  Insomnia: she is taking melatonin, she is afraid to take medications for sleep, states not napping during the day. She has sleep apnea and has not  been using her CPAP machine. Discussed referral to pulmonologist  Obesity: she is cutting down on sodas and tea, she has been moving more and continues to gain weight. Discuss keto diet but she states not eating much.   Patient Active Problem List   Diagnosis Date Noted  . Iron deficiency anemia secondary to inadequate dietary iron intake 11/06/2017  . Special screening for malignant neoplasms, colon   . Benign neoplasm of descending colon   . Heartburn   . Gastric polyp   . Psoriasis 01/25/2016  . 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 Endometriosis 12/31/2014  . Insomnia, persistent 09/29/2014  . IBS (irritable bowel syndrome) 09/29/2014  . 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 Dyslipidemia 09/29/2014  . Fibromyalgia syndrome 09/29/2014  . Gastro-esophageal reflux disease without esophagitis 09/29/2014  . Herpes labialis 09/29/2014  . Adult hypothyroidism 09/29/2014  . Family history of malignant neoplasm of breast 09/29/2014  . Morbid obesity, unspecified obesity type ([place]) 09/29/2014  . Major depression, recurrent, chronic ([place]) 09/29/2014  . Obstructive apnea 09/29/2014  . Abnormal presence of protein in urine 09/29/2014  . Allergic rhinitis 09/29/2014  . Vitamin D deficiency 09/29/2014  . Urge  incontinence 09/29/2014  . Headache, tension-type 09/29/2014  . Menopausal symptom 09/29/2014    Past Surgical History:  Procedure Laterality Date  . ABDOMINAL HYSTERECTOMY  [id number]  . bladder tact    . BREAST BIOPSY Right 09/02/2015   US Guided biopsy - Ribbon shaped marker - negative  . CHOLECYSTECTOMY  [id number]  . COLONOSCOPY    . COLONOSCOPY WITH PROPOFOL N/A 02/10/2016   Procedure: COLONOSCOPY WITH PROPOFOL;  Surgeon: Lucilla Lame, MD;  Location: [place];  Service: Endoscopy;  Laterality: N/A;  . ESOPHAGOGASTRODUODENOSCOPY    . ESOPHAGOGASTRODUODENOSCOPY (EGD) WITH PROPOFOL N/A 02/10/2016   Procedure: ESOPHAGOGASTRODUODENOSCOPY (EGD) WITH PROPOFOL;   Surgeon: Lucilla Lame, MD;  Location: [place];  Service: Endoscopy;  Laterality: N/A;  sleep apnea  . LIPOMA EXCISION    . POLYPECTOMY  02/10/2016   Procedure: POLYPECTOMY;  Surgeon: Lucilla Lame, MD;  Location: [place];  Service: Endoscopy;;  . TONSILECTOMY, ADENOIDECTOMY, BILATERAL MYRINGOTOMY AND TUBES      Family History  Problem Relation Age of Onset  . Hypertension Mother   . Arthritis Mother   . Cancer Mother        ovarian,breast, lung, liver  . Breast cancer Mother 43  . Ovarian cancer Mother 55  . Lung cancer Mother 92  . Colon cancer Brother   . Other Father        lung problems  . Ovarian cancer Maternal Grandmother        pt thinks is was ovarian but not sure  . Heart disease Brother   . Cancer Maternal Grandfather     Social History   Socioeconomic History  . Marital status: Married    Spouse name: Elenore Rota  . Number of children: 2  . Years of education: Not on file  . Highest education level: 12th grade  Occupational History  . Occupation: Psychologist, occupational as Chiropodist     Comment: learning [employer]  Social Needs  . Financial resource strain: Not hard at all  . Food insecurity    Worry: Never true    Inability: Never true  . Transportation needs    Medical: No    Non-medical: No  Tobacco Use  . Smoking status: Never Smoker  . Smokeless tobacco: Never Used  Substance and Sexual Activity  . Alcohol use: Yes    Alcohol/week: 0.0 standard drinks    Comment: rarely  . Drug use: No  . Sexual activity: Yes    Partners: Male  Lifestyle  . Physical activity    Days per week: 0 days    Minutes per session: 0 min  . Stress: Very much  Relationships  . Social Herbalist on phone: Twice a week    Gets together: More than three times a week    Attends religious service: More than 4 times per year    Active member of club or organization: No    Attends meetings of clubs or organizations: Never    Relationship status:  Married  . Intimate partner violence    Fear of current or ex partner: No    Emotionally abused: No    Physically abused: No    Forced sexual activity: No  Other Topics Concern  . Not on file  Social History Narrative  . Not on file     Current Outpatient Medications:  .  amphetamine-dextroamphetamine (ADDERALL XR) 15 MG 24 hr capsule, Take 1 capsule by mouth every morning., Disp: 30 capsule, Rfl: 0 .  amphetamine-dextroamphetamine (ADDERALL XR) 15 MG 24 hr capsule, Take 1 capsule by mouth every morning., Disp: 30 capsule, Rfl: 0 .  amphetamine-dextroamphetamine (ADDERALL XR) 15 MG 24 hr capsule, Take 1 capsule by mouth every morning., Disp: 30 capsule, Rfl: 0 .  ARIPiprazole (ABILIFY) 2 MG tablet, Take 1 tablet (2 mg total) by mouth daily., Disp: 30 tablet, Rfl: 1 .  betamethasone dipropionate (DIPROLENE) 0.05 % cream, apply A THIN LAYER to affected area twice a day UNTIL CLEAR, Disp: , Rfl: 0 .  buPROPion (WELLBUTRIN XL) 150 MG 24 hr tablet, TAKE 1 TABLET(150 MG) BY MOUTH DAILY, Disp: 90 tablet, Rfl: 0 .  Ca Phosphate-Cholecalciferol (CALCIUM/VITAMIN D3 GUMMIES) 200-200 MG-UNIT CHEW, Chew 1 each by mouth daily., Disp: , Rfl:  .  citalopram (CELEXA) 20 MG tablet, TAKE 1 TABLET(20 MG) BY MOUTH DAILY, Disp: 90 tablet, Rfl: 0 .  levothyroxine (SYNTHROID, LEVOTHROID) 200 MCG tablet, TAKE 1 TABLET BY MOUTH EVERY DAY BEFORE BREAKFAST, Disp: 90 tablet, Rfl: 0 .  omeprazole (PRILOSEC) 20 MG capsule, TAKE 1 CAPSULE BY MOUTH TWICE DAILY, BEFORE A MEAL, Disp: 180 capsule, Rfl: 1 .  PAIN RELIEVER EXTRA STRENGTH 500 MG tablet, TAKE 1 TABLET(500 MG) BY MOUTH EVERY 6 HOURS AS NEEDED, Disp: 30 tablet, Rfl: 0 .  valACYclovir (VALTREX) 1000 MG tablet, TAKE 1 TABLET(1000 MG) BY MOUTH TWICE DAILY AS NEEDED, Disp: 30 tablet, Rfl: 0  No Known Allergies  I personally reviewed active problem list, medication list, allergies, family history, social history, health maintenance with the patient/caregiver  today.   ROS  Constitutional: Negative for fever positive for  weight change.  Respiratory: Negative for cough and shortness of breath.   Cardiovascular: Negative for chest pain or palpitations.  Gastrointestinal: Negative for abdominal pain, no bowel changes.  Musculoskeletal: Negative for gait problem or joint swelling.  Skin: Negative for rash.  Neurological: Negative for dizziness or headache.  No other specific complaints in a complete review of systems (except as listed in HPI above).  Objective  Vitals:   12/09/18 1138  BP: 112/78  Pulse: (!) 104  Resp: 20  Temp: (!) 96.9 F (36.1 C)  TempSrc: Temporal  SpO2: 96%  Weight: 262 lb 14.4 oz (119.3 kg)  Height: 5\' 9"  (1.753 m)    Body mass index is 38.82 kg/m.  Physical Exam  Constitutional: Patient appears well-developed and well-nourished. Obese  No distress.  HEENT: head atraumatic, normocephalic, pupils equal and reactive to light Cardiovascular: Normal rate, regular rhythm and normal heart sounds.  No murmur heard. No BLE edema. Pulmonary/Chest: Effort normal and breath sounds normal. No respiratory distress. Abdominal: Soft.  There is no tenderness. Skin: small ulcerations, no oozing on both sides of buttocks, in different stages, mostly dry, tender to touch, mild induration  Psychiatric: Patient has a normal mood and affect. behavior is normal. Judgment and thought content normal.  Recent Results (from the past 2160 hour(s))  Lipid panel     Status: Abnormal   Collection Time: 11/21/18 12:00 AM  Result Value Ref Range   Cholesterol 226 (H) <200 mg/dL   HDL 55 > OR = 50 mg/dL   Triglycerides 106 <150 mg/dL   LDL Cholesterol (Calc) 149 (H) mg/dL (calc)    Comment: Reference range: <100 . Desirable range <100 mg/dL for primary prevention;   <70 mg/dL for patients with CHD or diabetic patients  with > or = 2 CHD risk factors. Marland Kitchen LDL-C is now calculated using the Martin-Hopkins  calculation, which is a  validated novel method providing  better accuracy than the Friedewald equation in the  estimation of LDL-C.  Cresenciano Genre et al. Annamaria Helling. [id number]): 2061-2068  (http://education.[facility].com/faq/FAQ164)    Total CHOL/HDL Ratio 4.1 <5.0 (calc)   Non-HDL Cholesterol (Calc) 171 (H) <130 mg/dL (calc)    Comment: For patients with diabetes plus 1 major ASCVD risk  factor, treating to a non-HDL-C goal of <100 mg/dL  (LDL-C of <70 mg/dL) is considered a therapeutic  option.   Hemoglobin A1c     Status: None   Collection Time: 11/21/18 12:00 AM  Result Value Ref Range   Hgb A1c MFr Bld 5.4 <5.7 % of total Hgb    Comment: For the purpose of screening for the presence of diabetes: . <5.7%       Consistent with the absence of diabetes 5.7-6.4%    Consistent with increased risk for diabetes             (prediabetes) > or =6.5%  Consistent with diabetes . This assay result is consistent with a decreased risk of diabetes. . Currently, no consensus exists regarding use of hemoglobin A1c for diagnosis of diabetes in children. . According to American Diabetes Association (ADA) guidelines, hemoglobin A1c <7.0% represents optimal control in non-pregnant diabetic patients. Different metrics may apply to specific patient populations.  Standards of Medical Care in Diabetes(ADA). .    Mean Plasma Glucose 108 (calc)   eAG (mmol/L) 6.0 (calc)  TSH     Status: Abnormal   Collection Time: 11/21/18 12:00 AM  Result Value Ref Range   TSH 0.23 (L) 0.40 - 4.50 mIU/L  COMPLETE METABOLIC PANEL WITH GFR     Status: None   Collection Time: 11/21/18 12:00 AM  Result Value Ref Range   Glucose, Bld 82 65 - 99 mg/dL    Comment: .            Fasting reference interval .    BUN 9 7 - 25 mg/dL   Creat 1.00 0.50 - 1.05 mg/dL    Comment: For patients >61 years of age, the reference limit for Creatinine is approximately 13% higher for people identified as African-American. .    GFR, Est Non  African American 62 > OR = 60 mL/min/1.66m2   GFR, Est African American 72 > OR = 60 mL/min/1.15m2   BUN/Creatinine Ratio NOT APPLICABLE 6 - 22 (calc)   Sodium 140 135 - 146 mmol/L   Potassium 3.9 3.5 - 5.3 mmol/L   Chloride 102 98 - 110 mmol/L   CO2 28 20 - 32 mmol/L   Calcium 9.4 8.6 - 10.4 mg/dL   Total Protein 7.3 6.1 - 8.1 g/dL   Albumin 3.8 3.6 - 5.1 g/dL   Globulin 3.5 1.9 - 3.7 g/dL (calc)   AG Ratio 1.1 1.0 - 2.5 (calc)   Total Bilirubin 0.4 0.2 - 1.2 mg/dL   Alkaline phosphatase (APISO) 135 37 - 153 U/L   AST 13 10 - 35 U/L   ALT 14 6 - 29 U/L  CBC with Differential/Platelet     Status: Abnormal   Collection Time: 11/21/18 12:00 AM  Result Value Ref Range   WBC 6.9 3.8 - 10.8 Thousand/uL   RBC 4.67 3.80 - 5.10 Million/uL   Hemoglobin 12.1 11.7 - 15.5 g/dL   HCT 38.6 35.0 - 45.0 %   MCV 82.7 80.0 - 100.0 fL   MCH 25.9 (L) 27.0 - 33.0 pg   MCHC 31.3 (L) 32.0 - 36.0 g/dL  RDW 13.8 11.0 - 15.0 %   Platelets 351 140 - 400 Thousand/uL   MPV 10.9 7.5 - 12.5 fL   Neutro Abs 4,554 1,500 - 7,800 cells/uL   Lymphs Abs 1,904 850 - 3,900 cells/uL   Absolute Monocytes 366 200 - 950 cells/uL   Eosinophils Absolute 7 (L) 15 - 500 cells/uL   Basophils Absolute 69 0 - 200 cells/uL   Neutrophils Relative % 66 %   Total Lymphocyte 27.6 %   Monocytes Relative 5.3 %   Eosinophils Relative 0.1 %   Basophils Relative 1.0 %  Sedimentation rate     Status: Abnormal   Collection Time: 11/21/18 12:00 AM  Result Value Ref Range   Sed Rate 50 (H) 0 - 30 mm/h  C-reactive protein     Status: Abnormal   Collection Time: 11/21/18 12:00 AM  Result Value Ref Range   CRP 14.7 (H) <8.0 mg/L  Vitamin B12     Status: None   Collection Time: 11/21/18 12:00 AM  Result Value Ref Range   Vitamin B-12 398 200 - 1,100 pg/mL    Comment: . Please Note: Although the reference range for vitamin B12 is [phone] pg/mL, it has been reported that between 5 and 10% of patients with values between 200 and  400 pg/mL may experience neuropsychiatric and hematologic abnormalities due to occult B12 deficiency; less than 1% of patients with values above 400 pg/mL will have symptoms. .      PHQ2/9: Depression screen [facility] 2/9 12/09/2018 11/06/2018 06/19/2018 03/22/2018 12/11/2017  Decreased Interest 0 1 0 0 1  Down, Depressed, Hopeless 0 2 1 1 1   PHQ - 2 Score 0 3 1 1 2   Altered sleeping 3 3 3 3 3   Tired, decreased energy 3 3 2 2 3   Change in appetite 3 3 1 3 3   Feeling bad or failure about yourself  0 3 0 0 1  Trouble concentrating 1 3 1 2 2   Moving slowly or fidgety/restless 1 2 1  0 0  Suicidal thoughts 0 1 0 0 0  PHQ-9 Score 11 21 9 11 14   Difficult doing work/chores Somewhat difficult Extremely dIfficult Somewhat difficult Somewhat difficult -  Some recent data might be hidden    phq 9 is positive   Fall Risk: Fall Risk  12/09/2018 11/06/2018 06/19/2018 03/22/2018 12/11/2017  Falls in the past year? 1 1 0 0 Yes  Number falls in past yr: 1 1 0 - 2 or more  Comment - 6 - - -  Injury with Fall? 0 0 0 0 Yes  Comment - - - - Bruised her ribs-fell from a ladder  Risk for fall due to : - History of fall(s) - - -     Functional Status Survey: Is the patient deaf or have difficulty hearing?: No Does the patient have difficulty seeing, even when wearing glasses/contacts?: Yes Does the patient have difficulty concentrating, remembering, or making decisions?: No Does the patient have difficulty walking or climbing stairs?: No Does the patient have difficulty dressing or bathing?: No Does the patient have difficulty doing errands alone such as visiting a doctor's office or shopping?: No    Assessment & Plan  1. Arthralgia, unspecified joint  Check labs  2. Adult hypothyroidism  Return for labs in one month   3. Dyslipidemia  Low ASCVD , monitor for now  4. Chest pain at rest  - Ambulatory referral to Cardiology  5. Elevated sed rate   6. CRP  elevated  - ANA,IFA RA Diag Pnl  w/rflx Tit/Patn  7. Rash  - doxycycline (VIBRA-TABS) 100 MG tablet; Take 1 tablet (100 mg total) by mouth 2 (two) times daily.  Dispense: 14 tablet; Refill: 0  8. Atelectasis of left lung  - doxycycline (VIBRA-TABS) 100 MG tablet; Take 1 tablet (100 mg total) by mouth 2 (two) times daily.  Dispense: 14 tablet; Refill: 0  9. Needs flu shot  - Flu Vaccine MDCK QUAD PF   10. Primary insomnia  She refuses medication, she needs to use her CPAP machine

## 2018-12-12 LAB — ANTI-NUCLEAR AB-TITER (ANA TITER): ANA Titer 1: 1:40 {titer} — ABNORMAL HIGH

## 2018-12-12 LAB — ANA,IFA RA DIAG PNL W/RFLX TIT/PATN
Anti Nuclear Antibody (ANA): POSITIVE — AB
Cyclic Citrullin Peptide Ab: <16 UNITS
Rheumatoid fact SerPl-aCnc: <14 IU/mL (ref <14)

## 2018-12-19 ENCOUNTER — Ambulatory Visit: Payer: Self-pay | Admitting: *Deleted

## 2018-12-19 NOTE — Telephone Encounter (Signed)
Patient is calling to report she was out shopping and had another episode of chest pain- this pain lasted 20 minutes and was strong in back and chest. Patient would not let her brother call 911. Advised patient to go to ED per protocol to be checked- will notify office and also let them know that patient has not heard from referral. May need to follow up on that.  Reason for Disposition . [1] Chest pain (or "angina") comes and goes AND [2] is happening more often (increasing in frequency) or getting worse (increasing in severity)  Answer Assessment - Initial Assessment Questions 1. BLOOD PRESSURE: "What is the blood pressure?" "Did you take at least two measurements 5 minutes apart?"     152/102, 137/84 P 85 2. ONSET: "When did you take your blood pressure?"     10:45 3. HOW: "How did you obtain the blood pressure?" (e.g., visiting nurse, automatic home BP monitor)     Automatic cuff- arm 4. HISTORY: "Do you have a history of high blood pressure?"     no 5. MEDICATIONS: "Are you taking any medications for blood pressure?" "Have you missed any doses recently?"     No medication 6. OTHER SYMPTOMS: "Do you have any symptoms?" (e.g., headache, chest pain, blurred vision, difficulty breathing, weakness)     Chest pain 7. PREGNANCY: "Is there any chance you are pregnant?" "When was your last menstrual period?"     n/a  Answer Assessment - Initial Assessment Questions 1. LOCATION: "Where does it hurt?"       Deep pressure especially in the back 2. RADIATION: "Does the pain go anywhere else?" (e.g., into neck, jaw, arms, back)     Primarily chest and back 3. ONSET: "When did the chest pain begin?" (Minutes, hours or days)      This morning while shopping 4. PATTERN "Does the pain come and go, or has it been constant since it started?"  "Does it get worse with exertion?"      No pain now- comes and goes 5. DURATION: "How long does it last" (e.g., seconds, minutes, hours)     20 minutes 6.  SEVERITY: "How bad is the pain?"  (e.g., Scale 1-10; mild, moderate, or severe)    - MILD (1-3): doesn't interfere with normal activities     - MODERATE (4-7): interferes with normal activities or awakens from sleep    - SEVERE (8-10): excruciating pain, unable to do any normal activities       During episode-7-8 7. CARDIAC RISK FACTORS: "Do you have any history of heart problems or risk factors for heart disease?" (e.g., angina, prior heart attack; diabetes, high blood pressure, high cholesterol, smoker, or strong family history of heart disease)     family history, overweight,high cholesterol  8. PULMONARY RISK FACTORS: "Do you have any history of lung disease?"  (e.g., blood clots in lung, asthma, emphysema, birth control pills)     scar tissue on lungs 9. CAUSE: "What do you think is causing the chest pain?"     Unknown- mostly happens at night 10. OTHER SYMPTOMS: "Do you have any other symptoms?" (e.g., dizziness, nausea, vomiting, sweating, fever, difficulty breathing, cough)       Headache 11. PREGNANCY: "Is there any chance you are pregnant?" "When was your last menstrual period?"       n/a  Protocols used: CHEST PAIN-A-AH, HIGH BLOOD PRESSURE-A-AH

## 2018-12-20 NOTE — Telephone Encounter (Signed)
Called patient to see if she was evaluated at ED. No answer.

## 2018-12-24 ENCOUNTER — Other Ambulatory Visit: Payer: Self-pay | Admitting: Family Medicine

## 2018-12-24 DIAGNOSIS — R768 Other specified abnormal immunological findings in serum: Secondary | ICD-10-CM

## 2018-12-24 DIAGNOSIS — M2550 Pain in unspecified joint: Secondary | ICD-10-CM

## 2018-12-25 ENCOUNTER — Other Ambulatory Visit: Payer: Self-pay | Admitting: Family Medicine

## 2019-01-02 ENCOUNTER — Other Ambulatory Visit: Payer: Self-pay | Admitting: Family Medicine

## 2019-01-02 DIAGNOSIS — F339 Major depressive disorder, recurrent, unspecified: Secondary | ICD-10-CM

## 2019-01-02 NOTE — Telephone Encounter (Signed)
Requested medication (s) are due for refill today: yes  Requested medication (s) are on the active medication list: yes  Last refill:  12/06/2018  Future visit scheduled: yes  Notes to clinic:  Refill cannot be delegated    Requested Prescriptions  Pending Prescriptions Disp Refills   ARIPiprazole (ABILIFY) 2 MG tablet [Pharmacy Med Name: ARIPIPRAZOLE 2MG  TABLETS] 30 tablet 1    Sig: TAKE 1 TABLET(2 MG) BY MOUTH DAILY     Not Delegated - Psychiatry:  Antipsychotics - Second Generation (Atypical) - aripiprazole Failed - 01/02/2019  1:55 PM      Failed - This refill cannot be delegated      Passed - Valid encounter within last 6 months    Recent Outpatient Visits          3 weeks ago Arthralgia, unspecified joint   [facility] Steele Sizer, MD   1 month ago Fibromyalgia syndrome   [facility] Steele Sizer, MD   6 months ago Major depression, recurrent, chronic [facility])   [facility] Steele Sizer, MD   9 months ago Major depression, recurrent, chronic [facility])   [facility] Steele Sizer, MD   1 year ago Well woman exam   [facility] Steele Sizer, MD      Future Appointments            In 1 month Steele Sizer, MD [facility], [facility]

## 2019-01-18 ENCOUNTER — Other Ambulatory Visit: Payer: Self-pay | Admitting: Family Medicine

## 2019-01-18 DIAGNOSIS — E039 Hypothyroidism, unspecified: Secondary | ICD-10-CM

## 2019-01-18 DIAGNOSIS — F339 Major depressive disorder, recurrent, unspecified: Secondary | ICD-10-CM

## 2019-01-27 ENCOUNTER — Other Ambulatory Visit: Payer: Self-pay

## 2019-01-27 ENCOUNTER — Ambulatory Visit (INDEPENDENT_AMBULATORY_CARE_PROVIDER_SITE_OTHER): Payer: 59 | Admitting: Family Medicine

## 2019-01-27 ENCOUNTER — Encounter: Payer: Self-pay | Admitting: Family Medicine

## 2019-01-27 VITALS — BP 120/80 | HR 96 | Temp 97.3°F | Resp 16 | Ht 69.0 in | Wt 269.3 lb

## 2019-01-27 DIAGNOSIS — E039 Hypothyroidism, unspecified: Secondary | ICD-10-CM

## 2019-01-27 DIAGNOSIS — R7982 Elevated C-reactive protein (CRP): Secondary | ICD-10-CM | POA: Diagnosis not present

## 2019-01-27 DIAGNOSIS — M2550 Pain in unspecified joint: Secondary | ICD-10-CM | POA: Diagnosis not present

## 2019-01-27 DIAGNOSIS — R768 Other specified abnormal immunological findings in serum: Secondary | ICD-10-CM | POA: Diagnosis not present

## 2019-01-27 DIAGNOSIS — M797 Fibromyalgia: Secondary | ICD-10-CM

## 2019-01-27 DIAGNOSIS — F339 Major depressive disorder, recurrent, unspecified: Secondary | ICD-10-CM

## 2019-01-27 MED ORDER — ACETAMINOPHEN-CODEINE 300-30 MG PO TABS: 1.0000 | ORAL_TABLET | ORAL | 0 refills | Status: DC | PRN

## 2019-01-27 NOTE — Progress Notes (Signed)
Name: Debra Soto   MRN: [id number]    DOB: 1961-03-10   Date:01/27/2019       Progress Note  Subjective  Chief Complaint  Chief Complaint  Patient presents with  . Sleep Apnea  . Joint Pain  . Hypothyroidism  . Dyslipidemia  . Depression    HPI  Arthralgia: multiple joint pain, getting worse, also intermittent rashes, last labs showed positive ANA 1:40 - nuclear , speckled. We referred her to see a Rheumatologist and she has not gotten an appointment yet, we will give her the number so she can contact them directly   Atypical chest pain: see by Dr. Carlis Abbott and will have stress test tomorrow  Hypothyroidism: she is on lower dose of synthroid, one pill daily and half on Sundays and we will recheck labs today , she has been gaining weight, no change in bowel movements, but has noticed increase in dry skin.   Major Depression: shewason Duloxetine but asked to go back onCitalopram and Wellbutrin08/2018, she has not been able to sleep well at night, takes long naps during the day, phq9 is much higher. Feels guilty at night for sleeping during the day. We added Abilify about 6 weeks ago and she states it helped with her mood, but her ankles started to swell and she stopped medication 2 weeks ago, advised to try resuming it   [id number] Lyrica because it caused her to get moody. She states she is always is pain,she has not been as active lately, pain has intensified, sleeping during the day and up at night, disrupted sleep and feeling more tired than usual   Patient Active Problem List   Diagnosis Date Noted  . Morbid obesity ([place]) 12/27/2018  . Iron deficiency anemia secondary to inadequate dietary iron intake 11/06/2017  . Special screening for malignant neoplasms, colon   . Benign neoplasm of descending colon   . Heartburn   . Gastric polyp   . Psoriasis 01/25/2016  . 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 Endometriosis 12/31/2014  . Insomnia, persistent 09/29/2014   . IBS (irritable bowel syndrome) 09/29/2014  . 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 Dyslipidemia 09/29/2014  . Fibromyalgia syndrome 09/29/2014  . Gastro-esophageal reflux disease without esophagitis 09/29/2014  . Herpes labialis 09/29/2014  . Adult hypothyroidism 09/29/2014  . Family history of malignant neoplasm of breast 09/29/2014  . Major depression, recurrent, chronic ([place]) 09/29/2014  . Obstructive apnea 09/29/2014  . Abnormal presence of protein in urine 09/29/2014  . Allergic rhinitis 09/29/2014  . Vitamin D deficiency 09/29/2014  . Urge incontinence 09/29/2014  . Headache, tension-type 09/29/2014  . Menopausal symptom 09/29/2014    Past Surgical History:  Procedure Laterality Date  . ABDOMINAL HYSTERECTOMY  [id number]  . bladder tact    . BREAST BIOPSY Right 09/02/2015   US Guided biopsy - Ribbon shaped marker - negative  . CHOLECYSTECTOMY  [id number]  . COLONOSCOPY    . COLONOSCOPY WITH PROPOFOL N/A 02/10/2016   Procedure: COLONOSCOPY WITH PROPOFOL;  Surgeon: Lucilla Lame, MD;  Location: [place];  Service: Endoscopy;  Laterality: N/A;  . ESOPHAGOGASTRODUODENOSCOPY    . ESOPHAGOGASTRODUODENOSCOPY (EGD) WITH PROPOFOL N/A 02/10/2016   Procedure: ESOPHAGOGASTRODUODENOSCOPY (EGD) WITH PROPOFOL;  Surgeon: Lucilla Lame, MD;  Location: [place];  Service: Endoscopy;  Laterality: N/A;  sleep apnea  . LIPOMA EXCISION    . POLYPECTOMY  02/10/2016   Procedure: POLYPECTOMY;  Surgeon: Lucilla Lame, MD;  Location: [place];  Service: Endoscopy;;  . TONSILECTOMY, ADENOIDECTOMY, BILATERAL  MYRINGOTOMY AND TUBES      Family History  Problem Relation Age of Onset  . Hypertension Mother   . Arthritis Mother   . Cancer Mother        ovarian,breast, lung, liver  . Breast cancer Mother 31  . Ovarian cancer Mother 58  . Lung cancer Mother 2  . Colon cancer Brother   . Other Father        lung problems  . Ovarian cancer Maternal Grandmother        pt  thinks is was ovarian but not sure  . Heart disease Brother   . Cancer Maternal Grandfather     Social History   Socioeconomic History  . Marital status: Married    Spouse name: Elenore Rota  . Number of children: 2  . Years of education: Not on file  . Highest education level: 12th grade  Occupational History  . Occupation: Psychologist, occupational as Chiropodist     Comment: [employer]  Social Needs  . Financial resource strain: Not hard at all  . Food insecurity    Worry: Never true    Inability: Never true  . Transportation needs    Medical: No    Non-medical: No  Tobacco Use  . Smoking status: Never Smoker  . Smokeless tobacco: Never Used  Substance and Sexual Activity  . Alcohol use: Yes    Alcohol/week: 0.0 standard drinks    Comment: rarely  . Drug use: No  . Sexual activity: Yes    Partners: Male  Lifestyle  . Physical activity    Days per week: 0 days    Minutes per session: 0 min  . Stress: Very much  Relationships  . Social Herbalist on phone: Twice a week    Gets together: More than three times a week    Attends religious service: More than 4 times per year    Active member of club or organization: No    Attends meetings of clubs or organizations: Never    Relationship status: Married  . Intimate partner violence    Fear of current or ex partner: No    Emotionally abused: No    Physically abused: No    Forced sexual activity: No  Other Topics Concern  . Not on file  Social History Narrative  . Not on file     Current Outpatient Medications:  .  amphetamine-dextroamphetamine (ADDERALL XR) 15 MG 24 hr capsule, Take 1 capsule by mouth every morning., Disp: 30 capsule, Rfl: 0 .  amphetamine-dextroamphetamine (ADDERALL XR) 15 MG 24 hr capsule, Take 1 capsule by mouth every morning., Disp: 30 capsule, Rfl: 0 .  amphetamine-dextroamphetamine (ADDERALL XR) 15 MG 24 hr capsule, Take 1 capsule by mouth every morning., Disp: 30 capsule, Rfl: 0 .   ARIPiprazole (ABILIFY) 2 MG tablet, TAKE 1 TABLET(2 MG) BY MOUTH DAILY, Disp: 30 tablet, Rfl: 1 .  betamethasone dipropionate (DIPROLENE) 0.05 % cream, apply A THIN LAYER to affected area twice a day UNTIL CLEAR, Disp: , Rfl: 0 .  buPROPion (WELLBUTRIN XL) 150 MG 24 hr tablet, TAKE 1 TABLET(150 MG) BY MOUTH DAILY, Disp: 90 tablet, Rfl: 0 .  Ca Phosphate-Cholecalciferol (CALCIUM/VITAMIN D3 GUMMIES) 200-200 MG-UNIT CHEW, Chew 1 each by mouth daily., Disp: , Rfl:  .  citalopram (CELEXA) 20 MG tablet, TAKE 1 TABLET(20 MG) BY MOUTH DAILY, Disp: 90 tablet, Rfl: 0 .  levothyroxine (SYNTHROID) 200 MCG tablet, TAKE 1 TABLET BY MOUTH EVERY DAY BEFORE  BREAKFAST, Disp: 90 tablet, Rfl: 0 .  omeprazole (PRILOSEC) 20 MG capsule, TAKE 1 CAPSULE BY MOUTH TWICE DAILY, BEFORE A MEAL, Disp: 180 capsule, Rfl: 1 .  PAIN RELIEVER EXTRA STRENGTH 500 MG tablet, TAKE 1 TABLET(500 MG) BY MOUTH EVERY 6 HOURS AS NEEDED, Disp: 30 tablet, Rfl: 0 .  valACYclovir (VALTREX) 1000 MG tablet, TAKE 1 TABLET(1000 MG) BY MOUTH TWICE DAILY AS NEEDED, Disp: 30 tablet, Rfl: 0 .  Acetaminophen-Codeine (TYLENOL/CODEINE #3) 300-30 MG tablet, Take 1 tablet by mouth every 4 (four) hours as needed for pain., Disp: 30 tablet, Rfl: 0  No Known Allergies  I personally reviewed active problem list, medication list, allergies, family history, social history, health maintenance with the patient/caregiver today.   ROS  Ten systems reviewed and is negative except as mentioned in HPI   Objective  Vitals:   01/27/19 1130  BP: 120/80  Pulse: 96  Resp: 16  Temp: (!) 97.3 F (36.3 C)  TempSrc: Temporal  SpO2: 99%  Weight: 269 lb 4.8 oz (122.2 kg)  Height: 5\' 9"  (1.753 m)    Body mass index is 39.77 kg/m.  Physical Exam  Constitutional: Patient appears well-developed and well-nourished. Obese  No distress.  HEENT: head atraumatic, normocephalic, pupils equal and reactive to light Cardiovascular: Normal rate, regular rhythm and normal  heart sounds.  No murmur heard. No BLE edema. Pulmonary/Chest: Effort normal and breath sounds normal. No respiratory distress. Muscular Skeletal: trigger point positive, no erythema or effusion on either knee but crepitus with extension  Abdominal: Soft.  There is no tenderness. Psychiatric: Patient has a normal mood and affect. behavior is normal. Judgment and thought content normal.  Recent Results (from the past 2160 hour(s))  Lipid panel     Status: Abnormal   Collection Time: 11/21/18 12:00 AM  Result Value Ref Range   Cholesterol 226 (H) <200 mg/dL   HDL 55 > OR = 50 mg/dL   Triglycerides 106 <150 mg/dL   LDL Cholesterol (Calc) 149 (H) mg/dL (calc)    Comment: Reference range: <100 . Desirable range <100 mg/dL for primary prevention;   <70 mg/dL for patients with CHD or diabetic patients  with > or = 2 CHD risk factors. Marland Kitchen LDL-C is now calculated using the Martin-Hopkins  calculation, which is a validated novel method providing  better accuracy than the Friedewald equation in the  estimation of LDL-C.  Cresenciano Genre et al. Annamaria Helling. [id number]): 2061-2068  (http://education.[facility].com/faq/FAQ164)    Total CHOL/HDL Ratio 4.1 <5.0 (calc)   Non-HDL Cholesterol (Calc) 171 (H) <130 mg/dL (calc)    Comment: For patients with diabetes plus 1 major ASCVD risk  factor, treating to a non-HDL-C goal of <100 mg/dL  (LDL-C of <70 mg/dL) is considered a therapeutic  option.   Hemoglobin A1c     Status: None   Collection Time: 11/21/18 12:00 AM  Result Value Ref Range   Hgb A1c MFr Bld 5.4 <5.7 % of total Hgb    Comment: For the purpose of screening for the presence of diabetes: . <5.7%       Consistent with the absence of diabetes 5.7-6.4%    Consistent with increased risk for diabetes             (prediabetes) > or =6.5%  Consistent with diabetes . This assay result is consistent with a decreased risk of diabetes. . Currently, no consensus exists regarding use  of hemoglobin A1c for diagnosis of diabetes in children. . According to American Diabetes  Association (ADA) guidelines, hemoglobin A1c <7.0% represents optimal control in non-pregnant diabetic patients. Different metrics may apply to specific patient populations.  Standards of Medical Care in Diabetes(ADA). .    Mean Plasma Glucose 108 (calc)   eAG (mmol/L) 6.0 (calc)  TSH     Status: Abnormal   Collection Time: 11/21/18 12:00 AM  Result Value Ref Range   TSH 0.23 (L) 0.40 - 4.50 mIU/L  COMPLETE METABOLIC PANEL WITH GFR     Status: None   Collection Time: 11/21/18 12:00 AM  Result Value Ref Range   Glucose, Bld 82 65 - 99 mg/dL    Comment: .            Fasting reference interval .    BUN 9 7 - 25 mg/dL   Creat 1.00 0.50 - 1.05 mg/dL    Comment: For patients >43 years of age, the reference limit for Creatinine is approximately 13% higher for people identified as African-American. .    GFR, Est Non African American 62 > OR = 60 mL/min/1.7m2   GFR, Est African American 72 > OR = 60 mL/min/1.50m2   BUN/Creatinine Ratio NOT APPLICABLE 6 - 22 (calc)   Sodium 140 135 - 146 mmol/L   Potassium 3.9 3.5 - 5.3 mmol/L   Chloride 102 98 - 110 mmol/L   CO2 28 20 - 32 mmol/L   Calcium 9.4 8.6 - 10.4 mg/dL   Total Protein 7.3 6.1 - 8.1 g/dL   Albumin 3.8 3.6 - 5.1 g/dL   Globulin 3.5 1.9 - 3.7 g/dL (calc)   AG Ratio 1.1 1.0 - 2.5 (calc)   Total Bilirubin 0.4 0.2 - 1.2 mg/dL   Alkaline phosphatase (APISO) 135 37 - 153 U/L   AST 13 10 - 35 U/L   ALT 14 6 - 29 U/L  CBC with Differential/Platelet     Status: Abnormal   Collection Time: 11/21/18 12:00 AM  Result Value Ref Range   WBC 6.9 3.8 - 10.8 Thousand/uL   RBC 4.67 3.80 - 5.10 Million/uL   Hemoglobin 12.1 11.7 - 15.5 g/dL   HCT 38.6 35.0 - 45.0 %   MCV 82.7 80.0 - 100.0 fL   MCH 25.9 (L) 27.0 - 33.0 pg   MCHC 31.3 (L) 32.0 - 36.0 g/dL   RDW 13.8 11.0 - 15.0 %   Platelets 351 140 - 400 Thousand/uL   MPV 10.9 7.5 - 12.5 fL    Neutro Abs 4,554 1,500 - 7,800 cells/uL   Lymphs Abs 1,904 850 - 3,900 cells/uL   Absolute Monocytes 366 200 - 950 cells/uL   Eosinophils Absolute 7 (L) 15 - 500 cells/uL   Basophils Absolute 69 0 - 200 cells/uL   Neutrophils Relative % 66 %   Total Lymphocyte 27.6 %   Monocytes Relative 5.3 %   Eosinophils Relative 0.1 %   Basophils Relative 1.0 %  Sedimentation rate     Status: Abnormal   Collection Time: 11/21/18 12:00 AM  Result Value Ref Range   Sed Rate 50 (H) 0 - 30 mm/h  C-reactive protein     Status: Abnormal   Collection Time: 11/21/18 12:00 AM  Result Value Ref Range   CRP 14.7 (H) <8.0 mg/L  Vitamin B12     Status: None   Collection Time: 11/21/18 12:00 AM  Result Value Ref Range   Vitamin B-12 398 200 - 1,100 pg/mL    Comment: . Please Note: Although the reference range for vitamin B12 is [phone]  pg/mL, it has been reported that between 5 and 10% of patients with values between 200 and 400 pg/mL may experience neuropsychiatric and hematologic abnormalities due to occult B12 deficiency; less than 1% of patients with values above 400 pg/mL will have symptoms. .   ANA,IFA RA Diag Pnl w/rflx Tit/Patn     Status: Abnormal   Collection Time: 12/09/18 12:00 AM  Result Value Ref Range   Anti Nuclear Antibody (ANA) POSITIVE (A) NEGATIVE    Comment: ANA IFA is a first line screen for detecting the presence of up to approximately 150 autoantibodies in various autoimmune diseases. A positive ANA IFA result is suggestive of autoimmune disease and reflexes to titer and pattern. Further laboratory testing may be considered if clinically indicated. . For additional information, please refer to http://education.[facility].com/faq/FAQ177 (This link is being provided for informational/ educational purposes only.) .    Rhuematoid fact SerPl-aCnc [id number] IU/mL   Cyclic Citrullin Peptide Ab <16 UNITS    Comment: Reference Range Negative:            <20 Weak  Positive:       20-39 Moderate Positive:   40-59 Strong Positive:     >59 .    INTERPRETATION      Comment: . A positive ANA, IFA indicates that one or more  antibodies associated with connective tissue disease could be positive. The RF and CCP assays are each  65-70% sensitive for established rheumatoid arthritis.  While it is still possible that this patient has  rheumatoid arthritis, other connective tissue  diseases should be considered.  Veto Kemps ab-titer (ANA titer)     Status: Abnormal   Collection Time: 12/09/18 12:00 AM  Result Value Ref Range   ANA Titer 1 1:40 (H) titer    Comment: A low level ANA titer may be present in pre-clinical autoimmune diseases and normal individuals.                 Reference Range                 <1:40        Negative                 1:40-1:80    Low Antibody Level                 >1:80        Elevated Antibody Level .    ANA Pattern 1 Nuclear, Speckled (A)     Comment: Speckled pattern is associated with mixed connective tissue disease (MCTD), systemic lupus erythematosus (SLE), Sjogren's syndrome, dermatomyositis, and  systemic sclerosis/polymyositis overlap. . AC-2,4,5,29: Speckled . International Consensus on ANA Patterns (https://www.hernandez-brewer.com/)      PHQ2/9: Depression screen [facility] 2/9 01/27/2019 01/27/2019 12/09/2018 11/06/2018 06/19/2018  Decreased Interest 1 0 0 1 0  Down, Depressed, Hopeless 1 0 0 2 1  PHQ - 2 Score 2 0 0 3 1  Altered sleeping 3 0 3 3 3   Tired, decreased energy 2 0 3 3 2   Change in appetite 2 0 3 3 1   Feeling bad or failure about yourself  2 0 0 3 0  Trouble concentrating 2 0 1 3 1   Moving slowly or fidgety/restless 2 0 1 2 1   Suicidal thoughts 0 0 0 1 0  PHQ-9 Score 15 0 11 21 9   Difficult doing work/chores Somewhat difficult - Somewhat difficult Extremely dIfficult Somewhat difficult  Some recent data might be hidden  phq 9 is positive   Fall Risk: Fall Risk  01/27/2019  12/09/2018 11/06/2018 06/19/2018 03/22/2018  Falls in the past year? 0 1 1 0 0  Number falls in past yr: 0 1 1 0 -  Comment - - 6 - -  Injury with Fall? 0 0 0 0 0  Comment - - - - -  Risk for fall due to : - - History of fall(s) - -     Functional Status Survey: Is the patient deaf or have difficulty hearing?: No Does the patient have difficulty seeing, even when wearing glasses/contacts?: No Does the patient have difficulty concentrating, remembering, or making decisions?: Yes Does the patient have difficulty walking or climbing stairs?: Yes Does the patient have difficulty dressing or bathing?: No Does the patient have difficulty doing errands alone such as visiting a doctor's office or shopping?: No    Assessment & Plan  1. Arthralgia, unspecified joint  - C-reactive protein - Sedimentation rate - Acetaminophen-Codeine (TYLENOL/CODEINE #3) 300-30 MG tablet; Take 1 tablet by mouth every 4 (four) hours as needed for pain.  Dispense: 30 tablet; Refill: 0  2. Positive ANA (antinuclear antibody)  - C-reactive protein - Sedimentation rate  3. Adult hypothyroidism  - TSH  4. CRP elevated  - C-reactive protein - Sedimentation rate  5. Major depression, recurrent, chronic ([place])  She asked for refill of Adderal but should have one rx left   6. Fibromyalgia syndrome  Needs to contact rheumatologist

## 2019-01-28 LAB — TSH: TSH: 2.64 mIU/L (ref 0.40–4.50)

## 2019-01-28 LAB — SEDIMENTATION RATE: Sed Rate: 55 mm/h — ABNORMAL HIGH (ref 0–30)

## 2019-01-28 LAB — C-REACTIVE PROTEIN: CRP: 70 mg/L — ABNORMAL HIGH (ref <8.0)

## 2019-02-11 ENCOUNTER — Telehealth: Payer: Self-pay

## 2019-02-11 ENCOUNTER — Other Ambulatory Visit: Payer: Self-pay | Admitting: Family Medicine

## 2019-02-11 DIAGNOSIS — R768 Other specified abnormal immunological findings in serum: Secondary | ICD-10-CM

## 2019-02-11 DIAGNOSIS — M2550 Pain in unspecified joint: Secondary | ICD-10-CM

## 2019-02-11 NOTE — Telephone Encounter (Signed)
Copied from [place] [phone]. Topic: Referral - Request for Referral >> Feb 11, 2019  2:01 PM Alanda Slim E wrote: Has patient seen PCP for this complaint? Yes  *If NO, is insurance requiring patient see PCP for this issue before PCP can refer them? Referral for which specialty: Rheumatology  Preferred provider/office: [facility] rheumatology  fax#[phone] send to [facility] ([address] [place], [place]) Dr. Greggory Brandy Reason for referral: Scar tissue on lungs and ANA tested positive    Pt received a referral but for some reason it was cancelled / Pts appt is December 14th and they need the referral and her records before then.

## 2019-02-25 ENCOUNTER — Ambulatory Visit: Payer: 59 | Admitting: Family Medicine

## 2019-03-19 ENCOUNTER — Other Ambulatory Visit: Payer: Self-pay | Admitting: Family Medicine

## 2019-03-19 DIAGNOSIS — F339 Major depressive disorder, recurrent, unspecified: Secondary | ICD-10-CM

## 2019-03-19 NOTE — Telephone Encounter (Signed)
Requested medication (s) are due for refill today: yes  Requested medication (s) are on the active medication list: yes  Last refill:  02/17/2019  Future visit scheduled: yes  Notes to clinic:  This refill cannot be delegated   Requested Prescriptions  Pending Prescriptions Disp Refills   ARIPiprazole (ABILIFY) 2 MG tablet [Pharmacy Med Name: ARIPIPRAZOLE 2MG  TABLETS] 30 tablet 1    Sig: TAKE 1 TABLET(2 MG) BY MOUTH DAILY      Not Delegated - Psychiatry:  Antipsychotics - Second Generation (Atypical) - aripiprazole Failed - 03/19/2019  7:08 AM      Failed - This refill cannot be delegated      Passed - Valid encounter within last 6 months    Recent Outpatient Visits           1 month ago Arthralgia, unspecified joint   [facility] Steele Sizer, MD   3 months ago Arthralgia, unspecified joint   [facility] Steele Sizer, MD   4 months ago Fibromyalgia syndrome   [facility] Steele Sizer, MD   9 months ago Major depression, recurrent, chronic [facility])   [facility] Steele Sizer, MD   12 months ago Major depression, recurrent, chronic [facility])   [facility] Steele Sizer, MD       Future Appointments             In 6 days Steele Sizer, MD [facility], [facility]

## 2019-03-20 NOTE — Addendum Note (Signed)
Addended by: Steele Sizer F on: 03/20/2019 02:23 PM   Modules accepted: Orders

## 2019-03-20 NOTE — Telephone Encounter (Signed)
Pt called and stated that she is no longer taking medication. Please advise

## 2019-03-25 ENCOUNTER — Ambulatory Visit: Payer: 59 | Admitting: Family Medicine

## 2019-04-09 ENCOUNTER — Ambulatory Visit (INDEPENDENT_AMBULATORY_CARE_PROVIDER_SITE_OTHER): Payer: 59 | Admitting: Family Medicine

## 2019-04-09 ENCOUNTER — Encounter: Payer: Self-pay | Admitting: Family Medicine

## 2019-04-09 VITALS — HR 75 | Temp 97.0°F | Ht 69.0 in

## 2019-04-09 DIAGNOSIS — M797 Fibromyalgia: Secondary | ICD-10-CM

## 2019-04-09 DIAGNOSIS — E039 Hypothyroidism, unspecified: Secondary | ICD-10-CM

## 2019-04-09 DIAGNOSIS — E785 Hyperlipidemia, unspecified: Secondary | ICD-10-CM

## 2019-04-09 DIAGNOSIS — F339 Major depressive disorder, recurrent, unspecified: Secondary | ICD-10-CM

## 2019-04-09 DIAGNOSIS — K219 Gastro-esophageal reflux disease without esophagitis: Secondary | ICD-10-CM

## 2019-04-09 DIAGNOSIS — R768 Other specified abnormal immunological findings in serum: Secondary | ICD-10-CM | POA: Diagnosis not present

## 2019-04-09 MED ORDER — BUPROPION HCL ER (XL) 150 MG PO TB24: ORAL_TABLET | ORAL | 0 refills | Status: DC

## 2019-04-09 MED ORDER — AMPHETAMINE-DEXTROAMPHET ER 15 MG PO CP24: 15.0000 mg | ORAL_CAPSULE | ORAL | 0 refills | Status: DC

## 2019-04-09 MED ORDER — CITALOPRAM HYDROBROMIDE 20 MG PO TABS: ORAL_TABLET | ORAL | 0 refills | Status: DC

## 2019-04-09 MED ORDER — LEVOTHYROXINE SODIUM 200 MCG PO TABS: ORAL_TABLET | ORAL | 0 refills | Status: DC

## 2019-04-09 NOTE — Progress Notes (Signed)
Name: Debra Soto   MRN: [id number]    DOB: [birth year]   Date:04/09/2019       Progress Note  Subjective  Chief Complaint  Chief Complaint  Patient presents with  . Medication Refill  . Depression  . Fibromyalgia  . Hypothyroidism  . Insomnia  . Dyslipidemia  . Gastroesophageal Reflux    I connected with  Amedeo Kinsman  on 04/09/19 at  9:20 AM EST by a video enabled telemedicine application and verified that I am speaking with the correct person using two identifiers.  I discussed the limitations of evaluation and management by telemedicine and the availability of in person appointments. The patient expressed understanding and agreed to proceed. Staff also discussed with the patient that there may be a patient responsible charge related to this service. Patient Location: at home Provider Location: [facility]   HPI  Interstitial Lung Disease: seen by Dr. Brennan Bailey 03/2019 for SOB and abnormal CT chest, showed some granulomatous disease. She is going to follow up in Feb   CT chest, 02/28/2019: 1. Findings consistent with sequelae of a nonspecific granulomatous  disease including calcified mediastinal and hilar lymph nodes, calcified  lung granulomata, and splenic calcifications.  2. Scattered groundglass opacities throughout both lungs with areas of  tree-in-bud nodularity could reflect multifocal infection including  atypical etiologies, although chronic interstitial lung disease including  sarcoidosis cannot be excluded.  3. Nonspecific 3 mm solid pulmonary nodule in the right lower lobe.  4. Moderate-sized hiatal hernia.  Positive ANA and myalgia: seen Dr. Manuella Ghazi , only on Celebrex, she is doing better with medication, pain is not as intense now   Arthralgia: multiple joint pain, getting worse, also intermittent rashes, last labs showed positive ANA 1:40 - nuclear , speckled. We referred her to see a Rheumatologist and she has not gotten an appointment  yet, we will give her the number so she can contact them directly   Atypical chest pain: see by Dr. Carlis Abbott and will have stress test tomorrow  Hypothyroidism: she is on lower dose of synthroid, one pill daily and half on Sundays and we will recheck labs today , she has been gaining weight, no change in bowel movements, but has noticed increase in dry skin.   Major Depression: shewason Duloxetine but asked to go back onCitalopram and Wellbutrin08/2018, she has not been able to sleep well at night, takes long naps during the day, phq9 is much higher. Feels guilty at night for sleeping during the day. We added Abilify about 6 weeks ago and she states it helped with her mood, but her ankles started to swell and she stopped medication 2 weeks ago, advised to try resuming it   [id number] Lyrica because it causedher to get moody. She states she is always is pain,she states today is a good day, her pain is 3-4/10 right now. She has been on Celebrex   Exposure to COVID-19: she had body aches, chills yesterday, daughter was positive, she is going to get tested, she states she feels well today, not sure if from normal joint aches or if it is covid, advised self quarantine.   Morbid obesity: BMI above 35 with GERD, joint aches and dyslipidemia.   Patient Active Problem List   Diagnosis Date Noted  . Morbid obesity ([place]) 12/27/2018  . Iron deficiency anemia secondary to inadequate dietary iron intake 11/06/2017  . Special screening for malignant neoplasms, colon   . Benign neoplasm of descending colon   . Heartburn   .  Gastric polyp   . Psoriasis 01/25/2016  . 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 Endometriosis 12/31/2014  . Insomnia, persistent 09/29/2014  . IBS (irritable bowel syndrome) 09/29/2014  . 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 Dyslipidemia 09/29/2014  . Fibromyalgia syndrome 09/29/2014  . Gastro-esophageal reflux disease without esophagitis 09/29/2014  . Herpes  labialis 09/29/2014  . Adult hypothyroidism 09/29/2014  . Family history of malignant neoplasm of breast 09/29/2014  . Major depression, recurrent, chronic ([place]) 09/29/2014  . Obstructive apnea 09/29/2014  . Abnormal presence of protein in urine 09/29/2014  . Allergic rhinitis 09/29/2014  . Vitamin D deficiency 09/29/2014  . Urge incontinence 09/29/2014  . Headache, tension-type 09/29/2014  . Menopausal symptom 09/29/2014    Past Surgical History:  Procedure Laterality Date  . ABDOMINAL HYSTERECTOMY  [id number]  . bladder tact    . BREAST BIOPSY Right 09/02/2015   US Guided biopsy - Ribbon shaped marker - negative  . CHOLECYSTECTOMY  [id number]  . COLONOSCOPY    . COLONOSCOPY WITH PROPOFOL N/A 02/10/2016   Procedure: COLONOSCOPY WITH PROPOFOL;  Surgeon: Lucilla Lame, MD;  Location: [place];  Service: Endoscopy;  Laterality: N/A;  . ESOPHAGOGASTRODUODENOSCOPY    . ESOPHAGOGASTRODUODENOSCOPY (EGD) WITH PROPOFOL N/A 02/10/2016   Procedure: ESOPHAGOGASTRODUODENOSCOPY (EGD) WITH PROPOFOL;  Surgeon: Lucilla Lame, MD;  Location: [place];  Service: Endoscopy;  Laterality: N/A;  sleep apnea  . LIPOMA EXCISION    . POLYPECTOMY  02/10/2016   Procedure: POLYPECTOMY;  Surgeon: Lucilla Lame, MD;  Location: [place];  Service: Endoscopy;;  . TONSILECTOMY, ADENOIDECTOMY, BILATERAL MYRINGOTOMY AND TUBES      Family History  Problem Relation Age of Onset  . Hypertension Mother   . Arthritis Mother   . Cancer Mother        ovarian,breast, lung, liver  . Breast cancer Mother 82  . Ovarian cancer Mother 62  . Lung cancer Mother 24  . Colon cancer Brother   . Other Father        lung problems  . Ovarian cancer Maternal Grandmother        pt thinks is was ovarian but not sure  . Heart disease Brother   . Cancer Maternal Grandfather      Current Outpatient Medications:  .  Acetaminophen-Codeine (TYLENOL/CODEINE #3) 300-30 MG tablet, Take 1 tablet by mouth every  4 (four) hours as needed for pain., Disp: 30 tablet, Rfl: 0 .  amphetamine-dextroamphetamine (ADDERALL XR) 15 MG 24 hr capsule, Take 1 capsule by mouth every morning., Disp: 30 capsule, Rfl: 0 .  amphetamine-dextroamphetamine (ADDERALL XR) 15 MG 24 hr capsule, Take 1 capsule by mouth every morning., Disp: 30 capsule, Rfl: 0 .  amphetamine-dextroamphetamine (ADDERALL XR) 15 MG 24 hr capsule, Take 1 capsule by mouth every morning., Disp: 30 capsule, Rfl: 0 .  betamethasone dipropionate (DIPROLENE) 0.05 % cream, apply A THIN LAYER to affected area twice a day UNTIL CLEAR, Disp: , Rfl: 0 .  buPROPion (WELLBUTRIN XL) 150 MG 24 hr tablet, Every morning, Disp: 90 tablet, Rfl: 0 .  Ca Phosphate-Cholecalciferol (CALCIUM/VITAMIN D3 GUMMIES) 200-200 MG-UNIT CHEW, Chew 1 each by mouth daily., Disp: , Rfl:  .  celecoxib (CELEBREX) 100 MG capsule, Take by mouth., Disp: , Rfl:  .  Cholecalciferol 25 MCG (1000 UT) capsule, Take by mouth., Disp: , Rfl:  .  citalopram (CELEXA) 20 MG tablet, Daily, Disp: 90 tablet, Rfl: 0 .  levothyroxine (SYNTHROID) 200 MCG tablet, Daily, Disp: 90 tablet, Rfl: 0 .  Melatonin 5 MG CHEW, Chew 10 mg by mouth. , Disp: , Rfl:  .  omeprazole (PRILOSEC) 20 MG capsule, TAKE 1 CAPSULE BY MOUTH TWICE DAILY, BEFORE A MEAL, Disp: 180 capsule, Rfl: 1 .  phenylephrine (SUDAFED PE) 10 MG TABS tablet, Take by mouth., Disp: , Rfl:  .  Turmeric 500 MG CAPS, Take by mouth., Disp: , Rfl:  .  valACYclovir (VALTREX) 1000 MG tablet, TAKE 1 TABLET(1000 MG) BY MOUTH TWICE DAILY AS NEEDED, Disp: 30 tablet, Rfl: 0  No Known Allergies  I personally reviewed active problem list, medication list, allergies, family history, social history, health maintenance with the patient/caregiver today.   ROS  Ten systems reviewed and is negative except as mentioned in HPI   Objective  Virtual encounter, vitals obtained at home  Vitals:   04/09/19 0857  Pulse: 75  Temp: (!) 97 F (36.1 C)    Body mass  index is 39.77 kg/m.  Physical Exam  Awake, alert and oriented   PHQ2/9: Depression screen [facility] 2/9 04/09/2019 01/27/2019 01/27/2019 12/09/2018 11/06/2018  Decreased Interest 0 1 0 0 1  Down, Depressed, Hopeless 0 1 0 0 2  PHQ - 2 Score 0 2 0 0 3  Altered sleeping 2 3 0 3 3  Tired, decreased energy 1 2 0 3 3  Change in appetite 1 2 0 3 3  Feeling bad or failure about yourself  0 2 0 0 3  Trouble concentrating 2 2 0 1 3  Moving slowly or fidgety/restless 0 2 0 1 2  Suicidal thoughts 0 0 0 0 1  PHQ-9 Score 6 15 0 11 21  Difficult doing work/chores Somewhat difficult Somewhat difficult - Somewhat difficult Extremely dIfficult  Some recent data might be hidden   PHQ-2/9 Result is positive.    Fall Risk: Fall Risk  04/09/2019 01/27/2019 12/09/2018 11/06/2018 06/19/2018  Falls in the past year? 1 0 1 1 0  Number falls in past yr: 1 0 1 1 0  Comment - - - 6 -  Injury with Fall? 0 0 0 0 0  Comment - - - - -  Risk for fall due to : - - - History of fall(s) -     Assessment & Plan  1. Major depression, recurrent, chronic (HCC)  - amphetamine-dextroamphetamine (ADDERALL XR) 15 MG 24 hr capsule; Take 1 capsule by mouth every morning.  Dispense: 30 capsule; Refill: 0 - amphetamine-dextroamphetamine (ADDERALL XR) 15 MG 24 hr capsule; Take 1 capsule by mouth every morning.  Dispense: 30 capsule; Refill: 0 - amphetamine-dextroamphetamine (ADDERALL XR) 15 MG 24 hr capsule; Take 1 capsule by mouth every morning.  Dispense: 30 capsule; Refill: 0 - buPROPion (WELLBUTRIN XL) 150 MG 24 hr tablet; Every morning  Dispense: 90 tablet; Refill: 0 - citalopram (CELEXA) 20 MG tablet; Daily  Dispense: 90 tablet; Refill: 0  2. Fibromyalgia syndrome  - amphetamine-dextroamphetamine (ADDERALL XR) 15 MG 24 hr capsule; Take 1 capsule by mouth every morning.  Dispense: 30 capsule; Refill: 0 - amphetamine-dextroamphetamine (ADDERALL XR) 15 MG 24 hr capsule; Take 1 capsule by mouth every morning.  Dispense: 30  capsule; Refill: 0 - amphetamine-dextroamphetamine (ADDERALL XR) 15 MG 24 hr capsule; Take 1 capsule by mouth every morning.  Dispense: 30 capsule; Refill: 0  3. Adult hypothyroidism  - levothyroxine (SYNTHROID) 200 MCG tablet; Daily  Dispense: 90 tablet; Refill: 0  4. Positive ANA (antinuclear antibody)  Keep follow up with Dr. Manuella Ghazi   5. Dyslipidemia  6. Gastro-esophageal reflux disease without esophagitis  Controlled with medication  7. Morbid obesity ([place])  Discussed with the patient the risk posed by an increased BMI. Discussed importance of portion control, calorie counting and at least 150 minutes of physical activity weekly. Avoid sweet beverages and drink more water. Eat at least 6 servings of fruit and vegetables daily   I discussed the assessment and treatment plan with the patient. The patient was provided an opportunity to ask questions and all were answered. The patient agreed with the plan and demonstrated an understanding of the instructions.  The patient was advised to call back or seek an in-person evaluation if the symptoms worsen or if the condition fails to improve as anticipated.  I provided 25 minutes of non-face-to-face time during this encounter.

## 2019-04-22 ENCOUNTER — Other Ambulatory Visit: Payer: Self-pay | Admitting: Family Medicine

## 2019-04-22 DIAGNOSIS — K219 Gastro-esophageal reflux disease without esophagitis: Secondary | ICD-10-CM

## 2019-05-18 ENCOUNTER — Other Ambulatory Visit: Payer: Self-pay | Admitting: Family Medicine

## 2019-05-18 DIAGNOSIS — K219 Gastro-esophageal reflux disease without esophagitis: Secondary | ICD-10-CM

## 2019-05-18 DIAGNOSIS — F339 Major depressive disorder, recurrent, unspecified: Secondary | ICD-10-CM

## 2019-07-08 ENCOUNTER — Other Ambulatory Visit: Payer: Self-pay

## 2019-07-08 ENCOUNTER — Encounter: Payer: Self-pay | Admitting: Family Medicine

## 2019-07-08 ENCOUNTER — Other Ambulatory Visit (HOSPITAL_COMMUNITY)
Admission: RE | Admit: 2019-07-08 | Discharge: 2019-07-08 | Disposition: A | Discharge status: Home / Self Care | Payer: 59 | Source: Ambulatory Visit | Attending: Family Medicine | Admitting: Family Medicine

## 2019-07-08 ENCOUNTER — Ambulatory Visit (INDEPENDENT_AMBULATORY_CARE_PROVIDER_SITE_OTHER): Payer: 59 | Admitting: Family Medicine

## 2019-07-08 VITALS — BP 132/84 | HR 96 | Temp 97.3°F | Resp 16 | Ht 69.0 in | Wt 272.9 lb

## 2019-07-08 DIAGNOSIS — F339 Major depressive disorder, recurrent, unspecified: Secondary | ICD-10-CM

## 2019-07-08 DIAGNOSIS — R310 Gross hematuria: Secondary | ICD-10-CM

## 2019-07-08 DIAGNOSIS — E039 Hypothyroidism, unspecified: Secondary | ICD-10-CM | POA: Diagnosis not present

## 2019-07-08 DIAGNOSIS — M2550 Pain in unspecified joint: Secondary | ICD-10-CM

## 2019-07-08 DIAGNOSIS — R339 Retention of urine, unspecified: Secondary | ICD-10-CM

## 2019-07-08 DIAGNOSIS — M797 Fibromyalgia: Secondary | ICD-10-CM

## 2019-07-08 DIAGNOSIS — E785 Hyperlipidemia, unspecified: Secondary | ICD-10-CM

## 2019-07-08 DIAGNOSIS — E538 Deficiency of other specified B group vitamins: Secondary | ICD-10-CM

## 2019-07-08 DIAGNOSIS — R7689 Other specified abnormal immunological findings in serum: Secondary | ICD-10-CM

## 2019-07-08 DIAGNOSIS — Z113 Encounter for screening for infections with a predominantly sexual mode of transmission: Secondary | ICD-10-CM | POA: Diagnosis not present

## 2019-07-08 DIAGNOSIS — E559 Vitamin D deficiency, unspecified: Secondary | ICD-10-CM

## 2019-07-08 DIAGNOSIS — R768 Other specified abnormal immunological findings in serum: Secondary | ICD-10-CM

## 2019-07-08 DIAGNOSIS — F5101 Primary insomnia: Secondary | ICD-10-CM

## 2019-07-08 DIAGNOSIS — K219 Gastro-esophageal reflux disease without esophagitis: Secondary | ICD-10-CM

## 2019-07-08 MED ORDER — AMPHETAMINE-DEXTROAMPHET ER 15 MG PO CP24: 15.0000 mg | ORAL_CAPSULE | ORAL | 0 refills | Status: DC

## 2019-07-08 MED ORDER — OMEPRAZOLE 20 MG PO CPDR: DELAYED_RELEASE_CAPSULE | ORAL | 1 refills | Status: DC

## 2019-07-08 MED ORDER — CITALOPRAM HYDROBROMIDE 20 MG PO TABS: ORAL_TABLET | ORAL | 1 refills | Status: DC

## 2019-07-08 MED ORDER — BUPROPION HCL ER (XL) 150 MG PO TB24: ORAL_TABLET | ORAL | 1 refills | Status: DC

## 2019-07-08 MED ORDER — MELOXICAM 15 MG PO TABS: 15.0000 mg | ORAL_TABLET | Freq: Every day | ORAL | 0 refills | Status: DC | PRN

## 2019-07-08 NOTE — Progress Notes (Signed)
Name: Debra Soto   MRN: [id number]    DOB: 04/08/1960   Date:07/08/2019       Progress Note  Subjective  Chief Complaint  Chief Complaint  Patient presents with  . Medication Refill  . Depression  . Fibromyalgia  . Hypothyroidism    HPI  Interstitial Lung Disease: seen by Dr. Brennan Bailey 03/2019 for SOB and abnormal CT chest, showed some granulomatous disease. She went back for repeat CT that showed improvement. She states SOB only with moderate activity. She is not on any inhalers , she has a follow up in one year   CT chest, 02/28/2019: 1. Findings consistent with sequelae of a nonspecific granulomatous  disease including calcified mediastinal and hilar lymph nodes, calcified  lung granulomata, and splenic calcifications.  2. Scattered groundglass opacities throughout both lungs with areas of  tree-in-bud nodularity could reflect multifocal infection including  atypical etiologies, although chronic interstitial lung disease including  sarcoidosis cannot be excluded.  3. Nonspecific 3 mm solid pulmonary nodule in the right lower lobe.  4. Moderate-sized hiatal hernia.  Repeat CT chest done 05/2019   Redemonstration of stigmata of chronic granulomatous disease including calcified mediastinal lymph nodes and calcified pulmonary nodules.  Previously noted groundglass opacities and micronodular disease has largely resolved on today's exam with only minimal residual groundglass micronodular disease noted involving the right lower lobe favored to represent a mild inflammatory/infectious process.  Scattered subcentimeter pulmonary nodules not significant changed relative to the prior exam, the largest measuring 8 x 4 mm involving the left lower lobe in which continued imaging surveillance should be considered with repeat CT examination in approximately one year. Normal PFT   Moderate mixed type hiatal hernia redemonstrated.   Positive ANA and myalgia: seen Dr. Manuella Ghazi -  Rheumatologist , discussed possible lymphonodi biopsy and also immunomodulator but she is doing well on Tumeric  and will follow up in one year. On Dr. Trena Platt note they had mentioned either Celebrex of Meloxicam. Patient states Celebrex not covered by insurance and never got rx of Meloxicam, I will send it today   Atypical chest pain: see by Dr. Carlis Abbott back in October, on his note seems like he thought SOB secondary to deconditioning  Hypothyroidism: she is on lower dose of synthroid, one pill daily over the past 3 months.  She has been gaining weight, no change in bowel movements, but has noticed increase in dry skin.  Major Depression: shewason Duloxetine but asked to go back onCitalopram and Wellbutrin08/2018, she recently found out her husband has been cheating on her again, she is not crying, just feels angry at him. They are roommate now, she is happy her daughter is still at home and goes for walks with her. She has been taking benadryl prn for sleep   [id number] Lyrica because it causedher to get moody. She states she is always is pain,she states today is a good day, her pain is 3-4/10 right now. She states insurance did not pay for celebrex so we will try meloxicam prn   Morbid obesity: BMI above 35 with GERD, joint aches and dyslipidemia. She has gained more weight, discussed life style modification .   Hematuria: she states that about one week ago she saw blood when she wiped after voiding, followed by dysuria for about 24 hours, still has a sensation of incomplete void. We will send for urine culture. She had a hysterectomy   Patient Active Problem List   Diagnosis Date Noted  . Morbid obesity ([place]) 12/27/2018  .  Iron deficiency anemia secondary to inadequate dietary iron intake 11/06/2017  . Special screening for malignant neoplasms, colon   . Benign neoplasm of descending colon   . Heartburn   . Gastric polyp   . Psoriasis 01/25/2016  . Fibroadenoma of right  breast 10/22/2015  . Endometriosis 12/31/2014  . Insomnia, persistent 09/29/2014  . IBS (irritable bowel syndrome) 09/29/2014  . 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 Dyslipidemia 09/29/2014  . Fibromyalgia syndrome 09/29/2014  . Gastro-esophageal reflux disease without esophagitis 09/29/2014  . Herpes labialis 09/29/2014  . Adult hypothyroidism 09/29/2014  . Family history of malignant neoplasm of breast 09/29/2014  . Major depression, recurrent, chronic ([place]) 09/29/2014  . Obstructive apnea 09/29/2014  . Abnormal presence of protein in urine 09/29/2014  . Allergic rhinitis 09/29/2014  . Vitamin D deficiency 09/29/2014  . Urge incontinence 09/29/2014  . Headache, tension-type 09/29/2014  . Menopausal symptom 09/29/2014    Past Surgical History:  Procedure Laterality Date  . ABDOMINAL HYSTERECTOMY  [id number]  . bladder tact    . BREAST BIOPSY Right 09/02/2015   US Guided biopsy - Ribbon shaped marker - negative  . CHOLECYSTECTOMY  [id number]  . COLONOSCOPY    . COLONOSCOPY WITH PROPOFOL N/A 02/10/2016   Procedure: COLONOSCOPY WITH PROPOFOL;  Surgeon: Lucilla Lame, MD;  Location: [place];  Service: Endoscopy;  Laterality: N/A;  . ESOPHAGOGASTRODUODENOSCOPY    . ESOPHAGOGASTRODUODENOSCOPY (EGD) WITH PROPOFOL N/A 02/10/2016   Procedure: ESOPHAGOGASTRODUODENOSCOPY (EGD) WITH PROPOFOL;  Surgeon: Lucilla Lame, MD;  Location: [place];  Service: Endoscopy;  Laterality: N/A;  sleep apnea  . LIPOMA EXCISION    . POLYPECTOMY  02/10/2016   Procedure: POLYPECTOMY;  Surgeon: Lucilla Lame, MD;  Location: [place];  Service: Endoscopy;;  . TONSILECTOMY, ADENOIDECTOMY, BILATERAL MYRINGOTOMY AND TUBES      Family History  Problem Relation Age of Onset  . Hypertension Mother   . Arthritis Mother   . Cancer Mother        ovarian,breast, lung, liver  . Breast cancer Mother 7  . Ovarian cancer Mother 24  . Lung cancer Mother 39  . Colon cancer Brother   .  Other Father        lung problems  . Ovarian cancer Maternal Grandmother        pt thinks is was ovarian but not sure  . Heart disease Brother   . Cancer Maternal Grandfather     Social History   Tobacco Use  . Smoking status: Never Smoker  . Smokeless tobacco: Never Used  Substance Use Topics  . Alcohol use: Yes    Alcohol/week: 0.0 standard drinks    Comment: rarely     Current Outpatient Medications:  .  amphetamine-dextroamphetamine (ADDERALL XR) 15 MG 24 hr capsule, Take 1 capsule by mouth every morning., Disp: 30 capsule, Rfl: 0 .  amphetamine-dextroamphetamine (ADDERALL XR) 15 MG 24 hr capsule, Take 1 capsule by mouth every morning., Disp: 30 capsule, Rfl: 0 .  amphetamine-dextroamphetamine (ADDERALL XR) 15 MG 24 hr capsule, Take 1 capsule by mouth every morning., Disp: 30 capsule, Rfl: 0 .  buPROPion (WELLBUTRIN XL) 150 MG 24 hr tablet, Every morning, Disp: 90 tablet, Rfl: 0 .  Ca Phosphate-Cholecalciferol (CALCIUM/VITAMIN D3 GUMMIES) 200-200 MG-UNIT CHEW, Chew 1 each by mouth daily., Disp: , Rfl:  .  Cholecalciferol 25 MCG (1000 UT) capsule, Take by mouth., Disp: , Rfl:  .  citalopram (CELEXA) 20 MG tablet, Daily, Disp: 90 tablet, Rfl: 0 .  levothyroxine (SYNTHROID)  200 MCG tablet, Daily, Disp: 90 tablet, Rfl: 0 .  Melatonin 5 MG CHEW, Chew 10 mg by mouth. , Disp: , Rfl:  .  omeprazole (PRILOSEC) 20 MG capsule, TAKE 1 CAPSULE BY MOUTH TWICE DAILY BEFORE A MEAL, Disp: 180 capsule, Rfl: 0 .  phenylephrine (SUDAFED PE) 10 MG TABS tablet, Take by mouth., Disp: , Rfl:  .  Turmeric 500 MG CAPS, Take by mouth., Disp: , Rfl:  .  valACYclovir (VALTREX) 1000 MG tablet, TAKE 1 TABLET(1000 MG) BY MOUTH TWICE DAILY AS NEEDED, Disp: 30 tablet, Rfl: 0 .  Acetaminophen-Codeine (TYLENOL/CODEINE #3) 300-30 MG tablet, Take 1 tablet by mouth every 4 (four) hours as needed for pain. (Patient not taking: Reported on 07/08/2019), Disp: 30 tablet, Rfl: 0 .  betamethasone dipropionate (DIPROLENE)  0.05 % cream, apply A THIN LAYER to affected area twice a day UNTIL CLEAR, Disp: , Rfl: 0  No Known Allergies  I personally reviewed active problem list, medication list, allergies, family history, social history, health maintenance with the patient/caregiver today.   ROS  Constitutional: Negative for fever , positive for weight change.  Respiratory: Negative for cough and shortness of breath.   Cardiovascular: Negative for chest pain or palpitations.  Gastrointestinal: Negative for abdominal pain, no bowel changes.  Musculoskeletal: Negative for gait problem or joint swelling.  Skin: Negative for rash.  Neurological: Negative for dizziness or headache.  No other specific complaints in a complete review of systems (except as listed in HPI above).  Objective  Vitals:   07/08/19 1057  BP: 132/84  Pulse: 96  Resp: 16  Temp: (!) 97.3 F (36.3 C)  TempSrc: Temporal  SpO2: 96%  Weight: 272 lb 14.4 oz (123.8 kg)  Height: 5\' 9"  (1.753 m)    Body mass index is 40.3 kg/m.  Physical Exam  Constitutional: Patient appears well-developed and well-nourished. Obese  No distress.  HEENT: head atraumatic, normocephalic, pupils equal and reactive to light Cardiovascular: Normal rate, regular rhythm and normal heart sounds.  No murmur heard. No BLE edema. Pulmonary/Chest: Effort normal and breath sounds normal. No respiratory distress. Abdominal: Soft.  There is no tenderness. Muscular Skeletal: trigger point positive  Psychiatric: Patient has a normal mood and affect. behavior is normal. Judgment and thought content normal.  PHQ2/9: Depression screen [facility] 2/9 07/08/2019 04/09/2019 01/27/2019 01/27/2019 12/09/2018  Decreased Interest 1 0 1 0 0  Down, Depressed, Hopeless 1 0 1 0 0  PHQ - 2 Score 2 0 2 0 0  Altered sleeping 3 2 3  0 3  Tired, decreased energy 1 1 2  0 3  Change in appetite 1 1 2  0 3  Feeling bad or failure about yourself  0 0 2 0 0  Trouble concentrating 2 2 2  0 1  Moving  slowly or fidgety/restless 0 0 2 0 1  Suicidal thoughts 0 0 0 0 0  PHQ-9 Score 9 6 15  0 11  Difficult doing work/chores Somewhat difficult Somewhat difficult Somewhat difficult - Somewhat difficult  Some recent data might be hidden    phq 9 is positive   Fall Risk: Fall Risk  07/08/2019 04/09/2019 01/27/2019 12/09/2018 11/06/2018  Falls in the past year? 1 1 0 1 1  Number falls in past yr: 1 1 0 1 1  Comment - - - - 6  Injury with Fall? 0 0 0 0 0  Comment - - - - -  Risk for fall due to : Other (Comment) - - - History of  fall(s)  Risk for fall due to: Comment Gets dizzy and loses her footing - - - -      Functional Status Survey: Is the patient deaf or have difficulty hearing?: No Does the patient have difficulty seeing, even when wearing glasses/contacts?: No Does the patient have difficulty concentrating, remembering, or making decisions?: No Does the patient have difficulty walking or climbing stairs?: No Does the patient have difficulty dressing or bathing?: No Does the patient have difficulty doing errands alone such as visiting a doctor's office or shopping?: No    Assessment & Plan  1. Adult hypothyroidism  - TSH  2. Fibromyalgia syndrome  - amphetamine-dextroamphetamine (ADDERALL XR) 15 MG 24 hr capsule; Take 1 capsule by mouth every morning.  Dispense: 30 capsule; Refill: 0 - amphetamine-dextroamphetamine (ADDERALL XR) 15 MG 24 hr capsule; Take 1 capsule by mouth every morning.  Dispense: 30 capsule; Refill: 0 - amphetamine-dextroamphetamine (ADDERALL XR) 15 MG 24 hr capsule; Take 1 capsule by mouth every morning.  Dispense: 30 capsule; Refill: 0  3. Major depression, recurrent, chronic (HCC)  - citalopram (CELEXA) 20 MG tablet; Daily  Dispense: 90 tablet; Refill: 1 - buPROPion (WELLBUTRIN XL) 150 MG 24 hr tablet; Every morning  Dispense: 90 tablet; Refill: 1 - amphetamine-dextroamphetamine (ADDERALL XR) 15 MG 24 hr capsule; Take 1 capsule by mouth every morning.   Dispense: 30 capsule; Refill: 0 - amphetamine-dextroamphetamine (ADDERALL XR) 15 MG 24 hr capsule; Take 1 capsule by mouth every morning.  Dispense: 30 capsule; Refill: 0 - amphetamine-dextroamphetamine (ADDERALL XR) 15 MG 24 hr capsule; Take 1 capsule by mouth every morning.  Dispense: 30 capsule; Refill: 0  4. Morbid obesity ([place])  Discussed with the patient the risk posed by an increased BMI. Discussed importance of portion control, calorie counting and at least 150 minutes of physical activity weekly. Avoid sweet beverages and drink more water. Eat at least 6 servings of fruit and vegetables daily   5. Gastro-esophageal reflux disease without esophagitis  - omeprazole (PRILOSEC) 20 MG capsule; Twice daily  Dispense: 180 capsule; Refill: 1  6. Dyslipidemia   7. Primary insomnia  Taking benadryl prn   8. Vitamin D deficiency  Continue otc supplementation   9. B12 deficiency  Needs to resume b12 SL  10. Arthralgia, unspecified joint  - meloxicam (MOBIC) 15 MG tablet; Take 1 tablet (15 mg total) by mouth daily as needed for pain.  Dispense: 90 tablet; Refill: 0  11. Positive ANA (antinuclear antibody)  Seen by Dr. Manuella Ghazi - meloxicam (MOBIC) 15 MG tablet; Take 1 tablet (15 mg total) by mouth daily as needed for pain.  Dispense: 90 tablet; Refill: 0   12. Routine screening for STI (sexually transmitted infection)  - RPR - HIV Antibody (routine testing w rflx) - Cervicovaginal ancillary only   14. Incomplete bladder emptying  - CULTURE, URINE COMPREHENSIVE

## 2019-07-09 LAB — CERVICOVAGINAL ANCILLARY ONLY
Chlamydia: NEGATIVE
Comment: NEGATIVE
Comment: NORMAL
Neisseria Gonorrhea: NEGATIVE

## 2019-07-10 LAB — CULTURE, URINE COMPREHENSIVE
MICRO NUMBER:: 10414787
SPECIMEN QUALITY:: ADEQUATE

## 2019-07-10 LAB — HIV ANTIBODY (ROUTINE TESTING W REFLEX): HIV 1&2 Ab, 4th Generation: NONREACTIVE

## 2019-07-10 LAB — RPR: RPR Ser Ql: NONREACTIVE

## 2019-07-10 LAB — TSH: TSH: 0.78 mIU/L (ref 0.40–4.50)

## 2019-08-01 ENCOUNTER — Other Ambulatory Visit: Payer: Self-pay | Admitting: Family Medicine

## 2019-08-01 DIAGNOSIS — E039 Hypothyroidism, unspecified: Secondary | ICD-10-CM

## 2019-09-18 ENCOUNTER — Other Ambulatory Visit: Payer: Self-pay | Admitting: Family Medicine

## 2019-09-18 DIAGNOSIS — F339 Major depressive disorder, recurrent, unspecified: Secondary | ICD-10-CM

## 2019-09-18 DIAGNOSIS — M797 Fibromyalgia: Secondary | ICD-10-CM

## 2019-09-18 NOTE — Telephone Encounter (Signed)
Requested medication (s) are due for refill today: Yes  Requested medication (s) are on the active medication list: Yes  Last refill:  07/08/19  Future visit scheduled: Yes  Notes to clinic:  See request.    Requested Prescriptions  Pending Prescriptions Disp Refills   amphetamine-dextroamphetamine (ADDERALL XR) 15 MG 24 hr capsule 30 capsule 0    Sig: Take 1 capsule by mouth every morning.      Not Delegated - Psychiatry:  Stimulants/ADHD Failed - 09/18/2019  3:43 PM      Failed - This refill cannot be delegated      Failed - Urine Drug Screen completed in last 360 days.      Passed - Valid encounter within last 3 months    Recent Outpatient Visits           2 months ago Adult hypothyroidism   [facility] Steele Sizer, MD   5 months ago Positive ANA (antinuclear antibody)   [facility] Steele Sizer, MD   7 months ago Arthralgia, unspecified joint   [facility] Steele Sizer, MD   9 months ago Arthralgia, unspecified joint   [facility] Steele Sizer, MD   10 months ago Fibromyalgia syndrome   [facility] Steele Sizer, MD       Future Appointments             In 4 weeks Steele Sizer, MD [facility]

## 2019-09-18 NOTE — Telephone Encounter (Signed)
amphetamine-dextroamphetamine (ADDERALL XR) 15 MG 24 hr capsule  [id number] - [place], [address] AT [place] Phone:  [phone]  Fax:  [phone]     [facility] has called on behalf of PT wanting refill of above pt last visit was 07/08/19 and nxt visit is sch already for 10/17/19 Pharmacy request refill

## 2019-09-22 MED ORDER — AMPHETAMINE-DEXTROAMPHET ER 15 MG PO CP24: 15.0000 mg | ORAL_CAPSULE | ORAL | 0 refills | Status: DC

## 2019-10-17 ENCOUNTER — Ambulatory Visit (INDEPENDENT_AMBULATORY_CARE_PROVIDER_SITE_OTHER): Payer: 59 | Admitting: Family Medicine

## 2019-10-17 ENCOUNTER — Other Ambulatory Visit: Payer: Self-pay

## 2019-10-17 ENCOUNTER — Encounter: Payer: Self-pay | Admitting: Family Medicine

## 2019-10-17 VITALS — BP 122/82 | HR 90 | Temp 98.1°F | Resp 18 | Ht 69.0 in | Wt 263.5 lb

## 2019-10-17 DIAGNOSIS — F339 Major depressive disorder, recurrent, unspecified: Secondary | ICD-10-CM | POA: Diagnosis not present

## 2019-10-17 DIAGNOSIS — R768 Other specified abnormal immunological findings in serum: Secondary | ICD-10-CM

## 2019-10-17 DIAGNOSIS — R5383 Other fatigue: Secondary | ICD-10-CM

## 2019-10-17 DIAGNOSIS — Z79899 Other long term (current) drug therapy: Secondary | ICD-10-CM

## 2019-10-17 DIAGNOSIS — M797 Fibromyalgia: Secondary | ICD-10-CM | POA: Diagnosis not present

## 2019-10-17 DIAGNOSIS — K219 Gastro-esophageal reflux disease without esophagitis: Secondary | ICD-10-CM

## 2019-10-17 DIAGNOSIS — E538 Deficiency of other specified B group vitamins: Secondary | ICD-10-CM

## 2019-10-17 DIAGNOSIS — E039 Hypothyroidism, unspecified: Secondary | ICD-10-CM | POA: Diagnosis not present

## 2019-10-17 DIAGNOSIS — B001 Herpesviral vesicular dermatitis: Secondary | ICD-10-CM

## 2019-10-17 DIAGNOSIS — G4733 Obstructive sleep apnea (adult) (pediatric): Secondary | ICD-10-CM

## 2019-10-17 DIAGNOSIS — N399 Disorder of urinary system, unspecified: Secondary | ICD-10-CM

## 2019-10-17 DIAGNOSIS — E785 Hyperlipidemia, unspecified: Secondary | ICD-10-CM

## 2019-10-17 DIAGNOSIS — E559 Vitamin D deficiency, unspecified: Secondary | ICD-10-CM

## 2019-10-17 MED ORDER — CITALOPRAM HYDROBROMIDE 20 MG PO TABS: ORAL_TABLET | ORAL | 1 refills | Status: DC

## 2019-10-17 MED ORDER — AMPHETAMINE-DEXTROAMPHET ER 15 MG PO CP24: 15.0000 mg | ORAL_CAPSULE | ORAL | 0 refills | Status: DC

## 2019-10-17 MED ORDER — VALACYCLOVIR HCL 1 G PO TABS: 1000.0000 mg | ORAL_TABLET | Freq: Two times a day (BID) | ORAL | 0 refills | Status: DC

## 2019-10-17 MED ORDER — LEVOTHYROXINE SODIUM 200 MCG PO TABS: ORAL_TABLET | ORAL | 0 refills | Status: DC

## 2019-10-17 MED ORDER — BUPROPION HCL ER (XL) 150 MG PO TB24: ORAL_TABLET | ORAL | 1 refills | Status: DC

## 2019-10-17 MED ORDER — OMEPRAZOLE 20 MG PO CPDR: DELAYED_RELEASE_CAPSULE | ORAL | 1 refills | Status: DC

## 2019-10-17 NOTE — Progress Notes (Signed)
Name: Debra Soto   MRN: [id number]    DOB: [birth year]   Date:10/17/2019       Progress Note  Subjective  Chief Complaint  Chief Complaint  Patient presents with  . Follow-up  . Depression  . Medication Refill    HPI  Interstitial Lung Disease: seen by Dr. Brennan Bailey 03/2019 for SOB and abnormal CT chest, showed some granulomatous disease. She went back for repeat CT that showed improvement. She states SOB only with moderate activity. She is not on any inhalers , reminded her to schedule follow up appointment   CT chest, 02/28/2019: 1. Findings consistent with sequelae of a nonspecific granulomatous  disease including calcified mediastinal and hilar lymph nodes, calcified  lung granulomata, and splenic calcifications.  2. Scattered groundglass opacities throughout both lungs with areas of  tree-in-bud nodularity could reflect multifocal infection including  atypical etiologies, although chronic interstitial lung disease including  sarcoidosis cannot be excluded.  3. Nonspecific 3 mm solid pulmonary nodule in the right lower lobe.  4. Moderate-sized hiatal hernia.  Repeat CT chest done 05/2019   Redemonstration of stigmata of chronic granulomatous disease including calcified mediastinal lymph nodes and calcified pulmonary nodules.  Previously noted groundglass opacities and micronodular disease has largely resolved on today's exam with only minimal residual groundglass micronodular disease noted involving the right lower lobe favored to represent a mild inflammatory/infectious process.  Scattered subcentimeter pulmonary nodules not significant changed relative to the prior exam, the largest measuring 8 x 4 mm involving the left lower lobe in which continued imaging surveillance should be considered with repeat CT examination in approximately one year. Normal PFT   Moderate mixed type hiatal hernia redemonstrated.   Positive ANA and myalgia: seen Dr. Manuella Ghazi - Rheumatologist  , discussed possible lymphonodi biopsy and also immunomodulator but she is doing well on Tumeric  and will follow up in one year. On Dr. Trena Platt note they had mentioned either Celebrex or Meloxicam. Patient states Celebrex not covered by insurance so we gave her Meloxicam on her last visit but she states doing better and only taking medicaiton prn   Atypical chest pain: see by Dr. Carlis Abbott back in October, on his note seems like he thought SOB secondary to deconditioning, unchanged and she is doing better now   Hypothyroidism: she is on lower dose of synthroid, one pill daily , last TSH at goal.   She has been losing weight, no change in bowel movements, but has noticed increase in hair loss, in clumps , we will recheck level   Major Depression: shewason Duloxetine but asked to go back onCitalopram and Wellbutrin08/2018, she is doing better now, daughter is pregnant , pain is under better control. She states marital issues are still present, but coping better - feels like roommates . She feels lonely at times.  HYQ:MVHQIONGEX Lyrica because it causedher to get moody. She states she is always is pain,she states today is a good day, her pain is 3-4/10 right now. She states insurance did not pay for celebrex, she is taking Meloxicam prn.   Morbid obesity: BMI above 35 with GERD, joint aches and dyslipidemia. She lost 9 lbs since last visit, she states walking more often   Urinary problems : she states over the past 4 months she continues to have bright blood when wiping intermittently, s/p hysterectomy , no dysuria. She does not see urine mixed with blood. She also has hesitancy intermittently and incomplete void , she has to shift to empty bladder completely ,  last urine culture was negative   Headaches: she has noticed increase in headaches, almost daily, she is also feeling tired all the time, sleeping all night and feels tired during the day, she has OSA but cannot tolerate CPAP machine,  explained we will check labs, it may all be related to non compliance with CPAP machine     Patient Active Problem List   Diagnosis Date Noted  . Morbid obesity ([place]) 12/27/2018  . Iron deficiency anemia secondary to inadequate dietary iron intake 11/06/2017  . Special screening for malignant neoplasms, colon   . Benign neoplasm of descending colon   . Heartburn   . Gastric polyp   . Psoriasis 01/25/2016  . 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 Endometriosis 12/31/2014  . Insomnia, persistent 09/29/2014  . IBS (irritable bowel syndrome) 09/29/2014  . 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 Dyslipidemia 09/29/2014  . Fibromyalgia syndrome 09/29/2014  . Gastro-esophageal reflux disease without esophagitis 09/29/2014  . Herpes labialis 09/29/2014  . Adult hypothyroidism 09/29/2014  . Family history of malignant neoplasm of breast 09/29/2014  . Major depression, recurrent, chronic ([place]) 09/29/2014  . Obstructive apnea 09/29/2014  . Abnormal presence of protein in urine 09/29/2014  . Allergic rhinitis 09/29/2014  . Vitamin D deficiency 09/29/2014  . Urge incontinence 09/29/2014  . Headache, tension-type 09/29/2014  . Menopausal symptom 09/29/2014    Past Surgical History:  Procedure Laterality Date  . ABDOMINAL HYSTERECTOMY  [id number]  . bladder tact    . BREAST BIOPSY Right 09/02/2015   US Guided biopsy - Ribbon shaped marker - negative  . CHOLECYSTECTOMY  [id number]  . COLONOSCOPY    . COLONOSCOPY WITH PROPOFOL N/A 02/10/2016   Procedure: COLONOSCOPY WITH PROPOFOL;  Surgeon: Lucilla Lame, MD;  Location: [place];  Service: Endoscopy;  Laterality: N/A;  . ESOPHAGOGASTRODUODENOSCOPY    . ESOPHAGOGASTRODUODENOSCOPY (EGD) WITH PROPOFOL N/A 02/10/2016   Procedure: ESOPHAGOGASTRODUODENOSCOPY (EGD) WITH PROPOFOL;  Surgeon: Lucilla Lame, MD;  Location: [place];  Service: Endoscopy;  Laterality: N/A;  sleep apnea  . LIPOMA EXCISION    . POLYPECTOMY   02/10/2016   Procedure: POLYPECTOMY;  Surgeon: Lucilla Lame, MD;  Location: [place];  Service: Endoscopy;;  . TONSILECTOMY, ADENOIDECTOMY, BILATERAL MYRINGOTOMY AND TUBES      Family History  Problem Relation Age of Onset  . Hypertension Mother   . Arthritis Mother   . Cancer Mother        ovarian,breast, lung, liver  . Breast cancer Mother 26  . Ovarian cancer Mother 49  . Lung cancer Mother 68  . Colon cancer Brother   . Other Father        lung problems  . Ovarian cancer Maternal Grandmother        pt thinks is was ovarian but not sure  . Heart disease Brother   . Cancer Maternal Grandfather     Social History   Tobacco Use  . Smoking status: Never Smoker  . Smokeless tobacco: Never Used  Substance Use Topics  . Alcohol use: Yes    Alcohol/week: 0.0 standard drinks    Comment: rarely     Current Outpatient Medications:  .  amphetamine-dextroamphetamine (ADDERALL XR) 15 MG 24 hr capsule, Take 1 capsule by mouth every morning., Disp: 30 capsule, Rfl: 0 .  buPROPion (WELLBUTRIN XL) 150 MG 24 hr tablet, Every morning, Disp: 90 tablet, Rfl: 1 .  Ca Phosphate-Cholecalciferol (CALCIUM/VITAMIN D3 GUMMIES) 200-200 MG-UNIT CHEW, Chew 1 each by mouth daily., Disp: ,  Rfl:  .  citalopram (CELEXA) 20 MG tablet, Daily, Disp: 90 tablet, Rfl: 1 .  levothyroxine (SYNTHROID) 200 MCG tablet, TAKE 1 TABLET BY MOUTH ONCE DAILY., Disp: 90 tablet, Rfl: 0 .  Melatonin 5 MG CHEW, Chew 10 mg by mouth. , Disp: , Rfl:  .  omeprazole (PRILOSEC) 20 MG capsule, Twice daily, Disp: 180 capsule, Rfl: 1 .  phenylephrine (SUDAFED PE) 10 MG TABS tablet, Take by mouth., Disp: , Rfl:  .  Turmeric 500 MG CAPS, Take by mouth., Disp: , Rfl:  .  valACYclovir (VALTREX) 1000 MG tablet, TAKE 1 TABLET(1000 MG) BY MOUTH TWICE DAILY AS NEEDED, Disp: 30 tablet, Rfl: 0 .  amphetamine-dextroamphetamine (ADDERALL XR) 15 MG 24 hr capsule, Take 1 capsule by mouth every morning. (Patient not taking: Reported on  10/17/2019), Disp: 30 capsule, Rfl: 0 .  amphetamine-dextroamphetamine (ADDERALL XR) 15 MG 24 hr capsule, Take 1 capsule by mouth every morning. (Patient not taking: Reported on 10/17/2019), Disp: 30 capsule, Rfl: 0 .  betamethasone dipropionate (DIPROLENE) 0.05 % cream, apply A THIN LAYER to affected area twice a day UNTIL CLEAR (Patient not taking: Reported on 10/17/2019), Disp: , Rfl: 0 .  Cholecalciferol 25 MCG (1000 UT) capsule, Take by mouth. (Patient not taking: Reported on 10/17/2019), Disp: , Rfl:  .  meloxicam (MOBIC) 15 MG tablet, Take 1 tablet (15 mg total) by mouth daily as needed for pain. (Patient not taking: Reported on 10/17/2019), Disp: 90 tablet, Rfl: 0  No Known Allergies  I personally reviewed active problem list, medication list, allergies, family history, social history, health maintenance with the patient/caregiver today.   ROS  Constitutional: Negative for fever , positive for  weight change.  Respiratory: Negative for cough and shortness of breath.   Cardiovascular: Negative for chest pain or palpitations.  Gastrointestinal: Negative for abdominal pain, no bowel changes.  Musculoskeletal: Negative for gait problem or joint swelling.  Skin: Negative for rash.  Neurological: Negative for dizziness or headache.  No other specific complaints in a complete review of systems (except as listed in HPI above).  Objective  Vitals:   10/17/19 1113  BP: 122/82  Pulse: 90  Resp: 18  Temp: 98.1 F (36.7 C)  TempSrc: Oral  SpO2: 97%  Weight: 263 lb 8 oz (119.5 kg)  Height: 5\' 9"  (1.753 m)    Body mass index is 38.91 kg/m.  Physical Exam  Constitutional: Patient appears well-developed and well-nourished. Obese  No distress.  HEENT: head atraumatic, normocephalic, pupils equal and reactive to light, neck supple, throat within normal limits, no thyromegaly  Cardiovascular: Normal rate, regular rhythm and normal heart sounds.  No murmur heard. No BLE edema. Pulmonary/Chest:  Effort normal and breath sounds normal. No respiratory distress. Abdominal: Soft.  There is no tenderness. Psychiatric: Patient has a normal mood and affect. behavior is normal. Judgment and thought content normal.   PHQ2/9: Depression screen [facility] 2/9 10/17/2019 07/08/2019 04/09/2019 01/27/2019 01/27/2019  Decreased Interest 0 1 0 1 0  Down, Depressed, Hopeless 0 1 0 1 0  PHQ - 2 Score 0 2 0 2 0  Altered sleeping 1 3 2 3  0  Tired, decreased energy 3 1 1 2  0  Change in appetite 0 1 1 2  0  Feeling bad or failure about yourself  0 0 0 2 0  Trouble concentrating 3 2 2 2  0  Moving slowly or fidgety/restless 0 0 0 2 0  Suicidal thoughts 0 0 0 0 0  PHQ-9 Score  7 9 6 15  0  Difficult doing work/chores Not difficult at all Somewhat difficult Somewhat difficult Somewhat difficult -  Some recent data might be hidden    phq 9 is negative    Fall Risk: Fall Risk  10/17/2019 07/08/2019 04/09/2019 01/27/2019 12/09/2018  Falls in the past year? 1 1 1  0 1  Number falls in past yr: 0 1 1 0 1  Comment - - - - -  Injury with Fall? 0 0 0 0 0  Comment - - - - -  Risk for fall due to : Impaired balance/gait;History of fall(s) Other (Comment) - - -  Risk for fall due to: Comment - Gets dizzy and loses her footing - - -     Functional Status Survey: Is the patient deaf or have difficulty hearing?: No Does the patient have difficulty seeing, even when wearing glasses/contacts?: No Does the patient have difficulty concentrating, remembering, or making decisions?: No Does the patient have difficulty walking or climbing stairs?: No Does the patient have difficulty dressing or bathing?: No Does the patient have difficulty doing errands alone such as visiting a doctor's office or shopping?: No    Assessment & Plan  1. Adult hypothyroidism  - levothyroxine (SYNTHROID) 200 MCG tablet; TAKE 1 TABLET BY MOUTH ONCE DAILY.  Dispense: 90 tablet; Refill: 0 - TSH  2. Fibromyalgia syndrome  -  amphetamine-dextroamphetamine (ADDERALL XR) 15 MG 24 hr capsule; Take 1 capsule by mouth every morning.  Dispense: 30 capsule; Refill: 0 - amphetamine-dextroamphetamine (ADDERALL XR) 15 MG 24 hr capsule; Take 1 capsule by mouth every morning.  Dispense: 30 capsule; Refill: 0 - amphetamine-dextroamphetamine (ADDERALL XR) 15 MG 24 hr capsule; Take 1 capsule by mouth every morning.  Dispense: 30 capsule; Refill: 0  3. Gastro-esophageal reflux disease without esophagitis  - omeprazole (PRILOSEC) 20 MG capsule; Twice daily  Dispense: 180 capsule; Refill: 1  4. Major depression, recurrent, chronic (HCC)  - buPROPion (WELLBUTRIN XL) 150 MG 24 hr tablet; Every morning  Dispense: 90 tablet; Refill: 1 - citalopram (CELEXA) 20 MG tablet; Daily  Dispense: 90 tablet; Refill: 1 - amphetamine-dextroamphetamine (ADDERALL XR) 15 MG 24 hr capsule; Take 1 capsule by mouth every morning.  Dispense: 30 capsule; Refill: 0 - amphetamine-dextroamphetamine (ADDERALL XR) 15 MG 24 hr capsule; Take 1 capsule by mouth every morning.  Dispense: 30 capsule; Refill: 0 - amphetamine-dextroamphetamine (ADDERALL XR) 15 MG 24 hr capsule; Take 1 capsule by mouth every morning.  Dispense: 30 capsule; Refill: 0  5. Vitamin D deficiency  - VITAMIN D 25 Hydroxy (Vit-D Deficiency, Fractures)  6. Morbid obesity (HCC)  Lost 9 lbs over the past few months   7. Dyslipidemia  - Lipid panel  8. B12 deficiency  - Vitamin B12  9. Positive ANA (antinuclear antibody)  Not having joint pains today   10. Fever blister  - valACYclovir (VALTREX) 1000 MG tablet; Take 1 tablet (1,000 mg total) by mouth 2 (two) times daily.  Dispense: 30 tablet; Refill: 0  11. Long-term use of high-risk medication  - COMPLETE METABOLIC PANEL WITH GFR  12. Other fatigue  - COMPLETE METABOLIC PANEL WITH GFR - CBC with Differential/Platelet   13. OSA (obstructive sleep apnea)  Unable to tolerate CPAP machine   Reminded her to get mammogram  done

## 2019-10-18 LAB — COMPLETE METABOLIC PANEL WITH GFR
AG Ratio: 1.1 (calc) (ref 1.0–2.5)
ALT: 14 U/L (ref 6–29)
AST: 14 U/L (ref 10–35)
Albumin: 3.8 g/dL (ref 3.6–5.1)
Alkaline phosphatase (APISO): 128 U/L (ref 37–153)
BUN: 12 mg/dL (ref 7–25)
CO2: 25 mmol/L (ref 20–32)
Calcium: 9.4 mg/dL (ref 8.6–10.4)
Chloride: 104 mmol/L (ref 98–110)
Creat: 0.97 mg/dL (ref 0.50–1.05)
GFR, Est African American: 74 mL/min/{1.73_m2} (ref >=60)
GFR, Est Non African American: 64 mL/min/{1.73_m2} (ref >=60)
Globulin: 3.5 g/dL (calc) (ref 1.9–3.7)
Glucose, Bld: 79 mg/dL (ref 65–99)
Potassium: 4.4 mmol/L (ref 3.5–5.3)
Sodium: 141 mmol/L (ref 135–146)
Total Bilirubin: 0.3 mg/dL (ref 0.2–1.2)
Total Protein: 7.3 g/dL (ref 6.1–8.1)

## 2019-10-18 LAB — CBC WITH DIFFERENTIAL/PLATELET
Absolute Monocytes: 333 cells/uL (ref 200–950)
Basophils Absolute: 67 cells/uL (ref 0–200)
Basophils Relative: 0.9 %
Eosinophils Absolute: 30 cells/uL (ref 15–500)
Eosinophils Relative: 0.4 %
HCT: 38.9 % (ref 35.0–45.0)
Hemoglobin: 11.9 g/dL (ref 11.7–15.5)
Lymphs Abs: 2035 cells/uL (ref 850–3900)
MCH: 23.9 pg — ABNORMAL LOW (ref 27.0–33.0)
MCHC: 30.6 g/dL — ABNORMAL LOW (ref 32.0–36.0)
MCV: 78.1 fL — ABNORMAL LOW (ref 80.0–100.0)
MPV: 10.1 fL (ref 7.5–12.5)
Monocytes Relative: 4.5 %
Neutro Abs: 4936 cells/uL (ref 1500–7800)
Neutrophils Relative %: 66.7 %
Platelets: 401 10*3/uL — ABNORMAL HIGH (ref 140–400)
RBC: 4.98 10*6/uL (ref 3.80–5.10)
RDW: 15.8 % — ABNORMAL HIGH (ref 11.0–15.0)
Total Lymphocyte: 27.5 %
WBC: 7.4 10*3/uL (ref 3.8–10.8)

## 2019-10-18 LAB — LIPID PANEL
Cholesterol: 223 mg/dL — ABNORMAL HIGH (ref <200)
HDL: 53 mg/dL (ref >=50)
LDL Cholesterol (Calc): 146 mg/dL (calc) — ABNORMAL HIGH
Non-HDL Cholesterol (Calc): 170 mg/dL (calc) — ABNORMAL HIGH (ref <130)
Total CHOL/HDL Ratio: 4.2 (calc) (ref <5.0)
Triglycerides: 118 mg/dL (ref <150)

## 2019-10-18 LAB — VITAMIN B12: Vitamin B-12: 404 pg/mL (ref 200–1100)

## 2019-10-18 LAB — TSH: TSH: 1.6 mIU/L (ref 0.40–4.50)

## 2019-10-18 LAB — VITAMIN D 25 HYDROXY (VIT D DEFICIENCY, FRACTURES): Vit D, 25-Hydroxy: 23 ng/mL — ABNORMAL LOW (ref 30–100)

## 2020-01-13 ENCOUNTER — Other Ambulatory Visit: Payer: Self-pay | Admitting: Family Medicine

## 2020-01-13 DIAGNOSIS — E039 Hypothyroidism, unspecified: Secondary | ICD-10-CM

## 2020-01-14 ENCOUNTER — Other Ambulatory Visit: Payer: Self-pay

## 2020-01-14 ENCOUNTER — Encounter: Payer: Self-pay | Admitting: Family Medicine

## 2020-01-14 ENCOUNTER — Ambulatory Visit (INDEPENDENT_AMBULATORY_CARE_PROVIDER_SITE_OTHER): Payer: 59 | Admitting: Family Medicine

## 2020-01-14 VITALS — BP 128/84 | HR 99 | Temp 98.1°F | Resp 18 | Ht 69.0 in | Wt 265.0 lb

## 2020-01-14 DIAGNOSIS — K219 Gastro-esophageal reflux disease without esophagitis: Secondary | ICD-10-CM

## 2020-01-14 DIAGNOSIS — E559 Vitamin D deficiency, unspecified: Secondary | ICD-10-CM

## 2020-01-14 DIAGNOSIS — F339 Major depressive disorder, recurrent, unspecified: Secondary | ICD-10-CM

## 2020-01-14 DIAGNOSIS — E039 Hypothyroidism, unspecified: Secondary | ICD-10-CM

## 2020-01-14 DIAGNOSIS — F5101 Primary insomnia: Secondary | ICD-10-CM

## 2020-01-14 DIAGNOSIS — Z23 Encounter for immunization: Secondary | ICD-10-CM | POA: Diagnosis not present

## 2020-01-14 DIAGNOSIS — M797 Fibromyalgia: Secondary | ICD-10-CM

## 2020-01-14 DIAGNOSIS — G44209 Tension-type headache, unspecified, not intractable: Secondary | ICD-10-CM

## 2020-01-14 DIAGNOSIS — Z1231 Encounter for screening mammogram for malignant neoplasm of breast: Secondary | ICD-10-CM

## 2020-01-14 DIAGNOSIS — G4733 Obstructive sleep apnea (adult) (pediatric): Secondary | ICD-10-CM

## 2020-01-14 DIAGNOSIS — J849 Interstitial pulmonary disease, unspecified: Secondary | ICD-10-CM

## 2020-01-14 DIAGNOSIS — E785 Hyperlipidemia, unspecified: Secondary | ICD-10-CM

## 2020-01-14 DIAGNOSIS — M542 Cervicalgia: Secondary | ICD-10-CM

## 2020-01-14 DIAGNOSIS — E538 Deficiency of other specified B group vitamins: Secondary | ICD-10-CM

## 2020-01-14 MED ORDER — AMPHETAMINE-DEXTROAMPHET ER 15 MG PO CP24: 15.0000 mg | ORAL_CAPSULE | ORAL | 0 refills | Status: DC

## 2020-01-14 NOTE — Addendum Note (Signed)
Addended by: Royal Hawthorn on: 01/14/2020 12:24 PM   Modules accepted: Orders

## 2020-01-14 NOTE — Patient Instructions (Addendum)
Mercie Eon, MD - Rheumatologist  Palakshappa, Herby Abraham, MD - Pulmonologist    Please call for mammogram appointment

## 2020-01-14 NOTE — Progress Notes (Addendum)
Name: Debra Soto   MRN: [id number]    DOB: 1960-11-07   Date:01/14/2020       Progress Note  Subjective  Chief Complaint  Chief Complaint  Patient presents with  . Follow-up  . Hypothyroidism  . Headache  . Depression    HPI   Interstitial Lung Disease: seen by Dr. Brennan Bailey 03/2019 for SOB and abnormal CT chest 02/2019 , showed some granulomatous disease also a nodule, . She went back for repeat CT. She states SOB only with moderate activity. She is not on any inhalers , she was supposed to go back for follow up in 6 months but did not get a reminded phone call, advised her to reach out to them   CT chest, 02/28/2019: 1. Findings consistent with sequelae of a nonspecific granulomatous  disease including calcified mediastinal and hilar lymph nodes, calcified  lung granulomata, and splenic calcifications.  2. Scattered groundglass opacities throughout both lungs with areas of  tree-in-bud nodularity could reflect multifocal infection including  atypical etiologies, although chronic interstitial lung disease including  sarcoidosis cannot be excluded.  3. Nonspecific 3 mm solid pulmonary nodule in the right lower lobe.  4. Moderate-sized hiatal hernia.  Repeat CT chest done 05/2019   Redemonstration of stigmata of chronic granulomatous disease including calcified mediastinal lymph nodes and calcified pulmonary nodules.  Previously noted groundglass opacities and micronodular disease has largely resolved on today's exam with only minimal residual groundglass micronodular disease noted involving the right lower lobe favored to represent a mild inflammatory/infectious process.  Scattered subcentimeter pulmonary nodules not significant changed relative to the prior exam, the largest measuring 8 x 4 mm involving the left lower lobe in which continued imaging surveillance should be considered with repeat CT examination in approximately one year. Normal PFT   Moderate mixed type  hiatal hernia redemonstrated.   Positive ANA and myalgia: seen Dr. Manuella Ghazi - Rheumatologist at [facility] , she was supposed to go back in 6 months, reminded her to call for follow up, she is taking Celebrex, she still has body aches and joint aches, difficulty being physically active   Atypical chest pain: see by Dr. Carlis Abbott back in October 2020 , on his note seems like he thought SOB secondary to deconditioning, unchanged and she is doing better now   Hypothyroidism: she is on 200 mcg synthroid, one pill daily , last TSH at goal.   Denies change in bowel movements, skin is dry but stable.   Major Depression: shewason Duloxetine but asked to go back onCitalopram and Wellbutrin08/2018, she still has memory difficulties but stable - has mental fogginess from [place] and when the pain is high it is worse. She states her daughter is having her baby January 2022, she recently moved out of their basement into her own house. She states symptoms are stable, but phq 9 is high   HAL:[id number] Lyrica because it causedher to get moody. She states she is always is pain,she states pain comes and goes on Meloxicam because Celebrex is not covered by insurance   Morbid obesity: BMI above 35 with GERD, joint aches and dyslipidemia.   Urinary problems : she states over the past 4 months she continues to have bright blood when wiping intermittently, s/p hysterectomy , no dysuria. She does not see urine mixed with blood. She also has hesitancy intermittently and incomplete void , she has to shift to empty bladder completely , last urine culture was negative . Unchanged   Headaches: still present , now daily,  usually on nuchal area, and radiates to back of her headache. Sometimes the pain is frontal also and radiates to the nuchal area. She has a history of DDD cervical spine. She states pain is described as aching like, intermittent, improves with otc medication, discussed referral to neurologist or headache center,  she prefers not taking any more medications.    Patient Active Problem List   Diagnosis Date Noted  . Morbid obesity ([place]) 12/27/2018  . Iron deficiency anemia secondary to inadequate dietary iron intake 11/06/2017  . Special screening for malignant neoplasms, colon   . Benign neoplasm of descending colon   . Heartburn   . Gastric polyp   . Psoriasis 01/25/2016  . 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 Endometriosis 12/31/2014  . Insomnia, persistent 09/29/2014  . IBS (irritable bowel syndrome) 09/29/2014  . 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 Dyslipidemia 09/29/2014  . Fibromyalgia syndrome 09/29/2014  . Gastro-esophageal reflux disease without esophagitis 09/29/2014  . Herpes labialis 09/29/2014  . Adult hypothyroidism 09/29/2014  . Family history of malignant neoplasm of breast 09/29/2014  . Major depression, recurrent, chronic ([place]) 09/29/2014  . Obstructive apnea 09/29/2014  . Abnormal presence of protein in urine 09/29/2014  . Allergic rhinitis 09/29/2014  . Vitamin D deficiency 09/29/2014  . Urge incontinence 09/29/2014  . Headache, tension-type 09/29/2014  . Menopausal symptom 09/29/2014    Past Surgical History:  Procedure Laterality Date  . ABDOMINAL HYSTERECTOMY  [id number]  . bladder tact    . BREAST BIOPSY Right 09/02/2015   US Guided biopsy - Ribbon shaped marker - negative  . CHOLECYSTECTOMY  [id number]  . COLONOSCOPY    . COLONOSCOPY WITH PROPOFOL N/A 02/10/2016   Procedure: COLONOSCOPY WITH PROPOFOL;  Surgeon: Lucilla Lame, MD;  Location: [place];  Service: Endoscopy;  Laterality: N/A;  . ESOPHAGOGASTRODUODENOSCOPY    . ESOPHAGOGASTRODUODENOSCOPY (EGD) WITH PROPOFOL N/A 02/10/2016   Procedure: ESOPHAGOGASTRODUODENOSCOPY (EGD) WITH PROPOFOL;  Surgeon: Lucilla Lame, MD;  Location: [place];  Service: Endoscopy;  Laterality: N/A;  sleep apnea  . LIPOMA EXCISION    . POLYPECTOMY  02/10/2016   Procedure: POLYPECTOMY;  Surgeon: Lucilla Lame, MD;  Location: [place];  Service: Endoscopy;;  . TONSILECTOMY, ADENOIDECTOMY, BILATERAL MYRINGOTOMY AND TUBES      Family History  Problem Relation Age of Onset  . Hypertension Mother   . Arthritis Mother   . Cancer Mother        ovarian,breast, lung, liver  . Breast cancer Mother 37  . Ovarian cancer Mother 49  . Lung cancer Mother 1  . Colon cancer Brother   . Other Father        lung problems  . Ovarian cancer Maternal Grandmother        pt thinks is was ovarian but not sure  . Heart disease Brother   . Cancer Maternal Grandfather     Social History   Tobacco Use  . Smoking status: Never Smoker  . Smokeless tobacco: Never Used  Substance Use Topics  . Alcohol use: Yes    Alcohol/week: 0.0 standard drinks    Comment: rarely     Current Outpatient Medications:  .  amphetamine-dextroamphetamine (ADDERALL XR) 15 MG 24 hr capsule, Take 1 capsule by mouth every morning., Disp: 30 capsule, Rfl: 0 .  buPROPion (WELLBUTRIN XL) 150 MG 24 hr tablet, Every morning, Disp: 90 tablet, Rfl: 1 .  Ca Phosphate-Cholecalciferol (CALCIUM/VITAMIN D3 GUMMIES) 200-200 MG-UNIT CHEW, Chew 1 each by mouth daily., Disp: ,  Rfl:  .  citalopram (CELEXA) 20 MG tablet, Daily, Disp: 90 tablet, Rfl: 1 .  levothyroxine (SYNTHROID) 200 MCG tablet, TAKE 1 TABLET BY MOUTH EVERY DAY, Disp: 90 tablet, Rfl: 0 .  Melatonin 5 MG CHEW, Chew 10 mg by mouth. , Disp: , Rfl:  .  meloxicam (MOBIC) 15 MG tablet, Take 1 tablet (15 mg total) by mouth daily as needed for pain., Disp: 90 tablet, Rfl: 0 .  omeprazole (PRILOSEC) 20 MG capsule, Twice daily, Disp: 180 capsule, Rfl: 1 .  phenylephrine (SUDAFED PE) 10 MG TABS tablet, Take by mouth., Disp: , Rfl:  .  Turmeric 500 MG CAPS, Take by mouth., Disp: , Rfl:  .  valACYclovir (VALTREX) 1000 MG tablet, Take 1 tablet (1,000 mg total) by mouth 2 (two) times daily., Disp: 30 tablet, Rfl: 0 .  amphetamine-dextroamphetamine (ADDERALL XR) 15 MG 24 hr capsule,  Take 1 capsule by mouth every morning. (Patient not taking: Reported on 01/14/2020), Disp: 30 capsule, Rfl: 0 .  amphetamine-dextroamphetamine (ADDERALL XR) 15 MG 24 hr capsule, Take 1 capsule by mouth every morning. (Patient not taking: Reported on 01/14/2020), Disp: 30 capsule, Rfl: 0  No Known Allergies  I personally reviewed active problem list, medication list, allergies, family history, social history, health maintenance with the patient/caregiver today.   ROS  Constitutional: Negative for fever or weight change.  Respiratory: Negative for cough and shortness of breath.   Cardiovascular: Negative for chest pain or palpitations.  Gastrointestinal: Negative for abdominal pain, no bowel changes.  Musculoskeletal: Negative for gait problem or joint swelling.  Skin: Negative for rash.  Neurological: Negative for dizziness , positive for headache.  No other specific complaints in a complete review of systems (except as listed in HPI above).  Objective  Vitals:   01/14/20 1125  BP: 128/84  Pulse: 99  Resp: 18  Temp: 98.1 F (36.7 C)  TempSrc: Oral  SpO2: 100%  Weight: 265 lb (120.2 kg)  Height: 5\' 9"  (1.753 m)    Body mass index is 39.13 kg/m.  Physical Exam  Constitutional: Patient appears well-developed and well-nourished. Obese  No distress.  HEENT: head atraumatic, normocephalic, pupils equal and reactive to light, neck supple Cardiovascular: Normal rate, regular rhythm and normal heart sounds.  No murmur heard. No BLE edema. Pulmonary/Chest: Effort normal and breath sounds normal. No respiratory distress. Abdominal: Soft.  There is no tenderness. Muscular Skeletal: trigger point positive  Neurologist : no focal findings  Psychiatric: Patient has a normal mood and affect. behavior is normal. Judgment and thought content normal.  Recent Results (from the past 2160 hour(s))  COMPLETE METABOLIC PANEL WITH GFR     Status: None   Collection Time: 10/17/19 11:59 AM   Result Value Ref Range   Glucose, Bld 79 65 - 99 mg/dL    Comment: .            Fasting reference interval .    BUN 12 7 - 25 mg/dL   Creat 0.97 0.50 - 1.05 mg/dL    Comment: For patients >64 years of age, the reference limit for Creatinine is approximately 13% higher for people identified as African-American. .    GFR, Est Non African American 64 > OR = 60 mL/min/1.73m2   GFR, Est African American 74 > OR = 60 mL/min/1.32m2   BUN/Creatinine Ratio NOT APPLICABLE 6 - 22 (calc)   Sodium 141 135 - 146 mmol/L   Potassium 4.4 3.5 - 5.3 mmol/L   Chloride 104 98 -  110 mmol/L   CO2 25 20 - 32 mmol/L   Calcium 9.4 8.6 - 10.4 mg/dL   Total Protein 7.3 6.1 - 8.1 g/dL   Albumin 3.8 3.6 - 5.1 g/dL   Globulin 3.5 1.9 - 3.7 g/dL (calc)   AG Ratio 1.1 1.0 - 2.5 (calc)   Total Bilirubin 0.3 0.2 - 1.2 mg/dL   Alkaline phosphatase (APISO) 128 37 - 153 U/L   AST 14 10 - 35 U/L   ALT 14 6 - 29 U/L  CBC with Differential/Platelet     Status: Abnormal   Collection Time: 10/17/19 11:59 AM  Result Value Ref Range   WBC 7.4 3.8 - 10.8 Thousand/uL   RBC 4.98 3.80 - 5.10 Million/uL   Hemoglobin 11.9 11.7 - 15.5 g/dL   HCT 38.9 35 - 45 %   MCV 78.1 (L) 80.0 - 100.0 fL   MCH 23.9 (L) 27.0 - 33.0 pg   MCHC 30.6 (L) 32.0 - 36.0 g/dL   RDW 15.8 (H) 11.0 - 15.0 %   Platelets 401 (H) 140 - 400 Thousand/uL   MPV 10.1 7.5 - 12.5 fL   Neutro Abs 4,936 1,500 - 7,800 cells/uL   Lymphs Abs 2,035 850 - 3,900 cells/uL   Absolute Monocytes 333 200 - 950 cells/uL   Eosinophils Absolute 30 15.0 - 500.0 cells/uL   Basophils Absolute 67 0.0 - 200.0 cells/uL   Neutrophils Relative % 66.7 %   Total Lymphocyte 27.5 %   Monocytes Relative 4.5 %   Eosinophils Relative 0.4 %   Basophils Relative 0.9 %  Vitamin B12     Status: None   Collection Time: 10/17/19 11:59 AM  Result Value Ref Range   Vitamin B-12 404 200 - 1,100 pg/mL  VITAMIN D 25 Hydroxy (Vit-D Deficiency, Fractures)     Status: Abnormal   Collection  Time: 10/17/19 11:59 AM  Result Value Ref Range   Vit D, 25-Hydroxy 23 (L) 30 - 100 ng/mL    Comment: Vitamin D Status         25-OH Vitamin D: . Deficiency:                    <20 ng/mL Insufficiency:             20 - 29 ng/mL Optimal:                 > or = 30 ng/mL . For 25-OH Vitamin D testing on patients on  D2-supplementation and patients for whom quantitation  of D2 and D3 fractions is required, the [facility](TM) 25-OH VIT D, (D2,D3), LC/MS/MS is recommended: order  code [phone] (patients >19yrs). See Note 1 . Note 1 . For additional information, please refer to  http://education.[facility].com/faq/FAQ199  (This link is being provided for informational/ educational purposes only.)   TSH     Status: None   Collection Time: 10/17/19 11:59 AM  Result Value Ref Range   TSH 1.60 0.40 - 4.50 mIU/L  Lipid panel     Status: Abnormal   Collection Time: 10/17/19 11:59 AM  Result Value Ref Range   Cholesterol 223 (H) <200 mg/dL   HDL 53 > OR = 50 mg/dL   Triglycerides 118 <150 mg/dL   LDL Cholesterol (Calc) 146 (H) mg/dL (calc)    Comment: Reference range: <100 . Desirable range <100 mg/dL for primary prevention;   <70 mg/dL for patients with CHD or diabetic patients  with > or = 2 CHD risk factors. Marland Kitchen  LDL-C is now calculated using the Martin-Hopkins  calculation, which is a validated novel method providing  better accuracy than the Friedewald equation in the  estimation of LDL-C.  Cresenciano Genre et al. Annamaria Helling. 1941;740(81): 2061-2068  (http://education.[facility].com/faq/FAQ164)    Total CHOL/HDL Ratio 4.2 <5.0 (calc)   Non-HDL Cholesterol (Calc) 170 (H) <130 mg/dL (calc)    Comment: For patients with diabetes plus 1 major ASCVD risk  factor, treating to a non-HDL-C goal of <100 mg/dL  (LDL-C of <70 mg/dL) is considered a therapeutic  option.     PHQ2/9: Depression screen [facility] 2/9 01/14/2020 10/17/2019 07/08/2019 04/09/2019 01/27/2019  Decreased Interest 1 0 1 0 1   Down, Depressed, Hopeless 1 0 1 0 1  PHQ - 2 Score 2 0 2 0 2  Altered sleeping 3 1 3 2 3   Tired, decreased energy 2 3 1 1 2   Change in appetite 2 0 1 1 2   Feeling bad or failure about yourself  0 0 0 0 2  Trouble concentrating 1 3 2 2 2   Moving slowly or fidgety/restless 0 0 0 0 2  Suicidal thoughts 0 0 0 0 0  PHQ-9 Score 10 7 9 6 15   Difficult doing work/chores Somewhat difficult Not difficult at all Somewhat difficult Somewhat difficult Somewhat difficult  Some recent data might be hidden    phq 9 is positive   Fall Risk: Fall Risk  01/14/2020 10/17/2019 07/08/2019 04/09/2019 01/27/2019  Falls in the past year? 1 1 1 1  0  Number falls in past yr: 1 0 1 1 0  Comment - - - - -  Injury with Fall? 0 0 0 0 0  Comment - - - - -  Risk for fall due to : History of fall(s) Impaired balance/gait;History of fall(s) Other (Comment) - -  Risk for fall due to: Comment - - Gets dizzy and loses her footing - -     Functional Status Survey: Is the patient deaf or have difficulty hearing?: Yes Does the patient have difficulty seeing, even when wearing glasses/contacts?: Yes Does the patient have difficulty concentrating, remembering, or making decisions?: Yes Does the patient have difficulty walking or climbing stairs?: Yes Does the patient have difficulty dressing or bathing?: No Does the patient have difficulty doing errands alone such as visiting a doctor's office or shopping?: No    Assessment & Plan  1. Major depression, recurrent, chronic (HCC)  - amphetamine-dextroamphetamine (ADDERALL XR) 15 MG 24 hr capsule; Take 1 capsule by mouth every morning.  Dispense: 30 capsule; Refill: 0 - amphetamine-dextroamphetamine (ADDERALL XR) 15 MG 24 hr capsule; Take 1 capsule by mouth every morning.  Dispense: 30 capsule; Refill: 0 - amphetamine-dextroamphetamine (ADDERALL XR) 15 MG 24 hr capsule; Take 1 capsule by mouth every morning.  Dispense: 30 capsule; Refill: 0  2. Adult  hypothyroidism   3. Fibromyalgia syndrome  - amphetamine-dextroamphetamine (ADDERALL XR) 15 MG 24 hr capsule; Take 1 capsule by mouth every morning.  Dispense: 30 capsule; Refill: 0 - amphetamine-dextroamphetamine (ADDERALL XR) 15 MG 24 hr capsule; Take 1 capsule by mouth every morning.  Dispense: 30 capsule; Refill: 0 - amphetamine-dextroamphetamine (ADDERALL XR) 15 MG 24 hr capsule; Take 1 capsule by mouth every morning.  Dispense: 30 capsule; Refill: 0  4. Gastro-esophageal reflux disease without esophagitis   5. Breast cancer screening by mammogram  She will make the appointment   6. Dyslipidemia   7. OSA (obstructive sleep apnea)  Not compliant   8. B12 deficiency  Needs to take supplementation   9. Morbid obesity ([place])  Discussed with the patient the risk posed by an increased BMI. Discussed importance of portion control, calorie counting and at least 150 minutes of physical activity weekly. Avoid sweet beverages and drink more water. Eat at least 6 servings of fruit and vegetables daily   10. Vitamin D deficiency  Needs to take otc vitamin D supplementation   11. Primary insomnia   12. Interstitial lung disease ([place])  Needs to follow up with pulmonologist   13. Posterior neck pain  She is wiling to go to a headache center  14. Tension headache  Referral neurologist   15. Need for shingles vaccine  - Varicella-zoster vaccine IM (Shingrix)  16. Need for immunization against influenza  - Flu Vaccine QUAD 36+ mos IM

## 2020-04-26 NOTE — Progress Notes (Deleted)
Name: Debra Soto   MRN: [id number]    DOB: 1961-02-14   Date:04/26/2020       Progress Note  Subjective  Chief Complaint  Follow up  HPI  Interstitial Lung Disease: seen by Dr. Brennan Bailey 03/2019 for SOB and abnormal CT chest 02/2019 , showed some granulomatous disease also a nodule, . She went back for repeat CT. She states SOB only with moderate activity. She is not on any inhalers , she was supposed to go back for follow up in 6 months but did not get a reminded phone call, advised her to reach out to them   CT chest, 02/28/2019: 1. Findings consistent with sequelae of a nonspecific granulomatous  disease including calcified mediastinal and hilar lymph nodes, calcified  lung granulomata, and splenic calcifications.  2. Scattered groundglass opacities throughout both lungs with areas of  tree-in-bud nodularity could reflect multifocal infection including  atypical etiologies, although chronic interstitial lung disease including  sarcoidosis cannot be excluded.  3. Nonspecific 3 mm solid pulmonary nodule in the right lower lobe.  4. Moderate-sized hiatal hernia.  Repeat CT chest done 05/2019   Redemonstration of stigmata of chronic granulomatous disease including calcified mediastinal lymph nodes and calcified pulmonary nodules.  Previously noted groundglass opacities and micronodular disease has largely resolved on today's exam with only minimal residual groundglass micronodular disease noted involving the right lower lobe favored to represent a mild inflammatory/infectious process.  Scattered subcentimeter pulmonary nodules not significant changed relative to the prior exam, the largest measuring 8 x 4 mm involving the left lower lobe in which continued imaging surveillance should be considered with repeat CT examination in approximately one year. Normal PFT   Moderate mixed type hiatal hernia redemonstrated.   Positive ANA and myalgia: seen Dr. Manuella Ghazi - Rheumatologist at  [facility] , she was supposed to go back in 6 months, reminded her to call for follow up, she is taking Celebrex, she still has body aches and joint aches, difficulty being physically active   Atypical chest pain: see by Dr. Carlis Abbott back in October 2020 , on his note seems like he thought SOB secondary to deconditioning, unchanged and she is doing better now   Hypothyroidism: she is on 200 mcg synthroid, one pill daily , last TSH at goal.   Denies change in bowel movements, skin is dry but stable.   Major Depression: shewason Duloxetine but asked to go back onCitalopram and Wellbutrin08/2018, she still has memory difficulties but stable - has mental fogginess from [place] and when the pain is high it is worse. She states her daughter is having her baby January 2022, she recently moved out of their basement into her own house. She states symptoms are stable, but phq 9 is high   YIR:SWNIOEVOJJ Lyrica because it causedher to get moody. She states she is always is pain,she states pain comes and goes on Meloxicam because Celebrex is not covered by insurance   Morbid obesity: BMI above 35 with GERD, joint aches and dyslipidemia.   Urinary problems : she states over the past 4 months she continues to have bright blood when wiping intermittently, s/p hysterectomy , no dysuria. She does not see urine mixed with blood. She also has hesitancy intermittently and incomplete void , she has to shift to empty bladder completely , last urine culture was negative . Unchanged   Headaches: still present , now daily, usually on nuchal area, and radiates to back of her headache. Sometimes the pain is frontal also and radiates  to the nuchal area. She has a history of DDD cervical spine. She states pain is described as aching like, intermittent, improves with otc medication, discussed referral to neurologist or headache center, she prefers not taking any more medications.  Patient Active Problem List   Diagnosis Date  Noted  . Morbid obesity ([place]) 12/27/2018  . Iron deficiency anemia secondary to inadequate dietary iron intake 11/06/2017  . Special screening for malignant neoplasms, colon   . Benign neoplasm of descending colon   . Heartburn   . Gastric polyp   . Psoriasis 01/25/2016  . 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 Endometriosis 12/31/2014  . Insomnia, persistent 09/29/2014  . IBS (irritable bowel syndrome) 09/29/2014  . 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 Dyslipidemia 09/29/2014  . Fibromyalgia syndrome 09/29/2014  . Gastro-esophageal reflux disease without esophagitis 09/29/2014  . Herpes labialis 09/29/2014  . Adult hypothyroidism 09/29/2014  . Family history of malignant neoplasm of breast 09/29/2014  . Major depression, recurrent, chronic ([place]) 09/29/2014  . Obstructive apnea 09/29/2014  . Abnormal presence of protein in urine 09/29/2014  . Allergic rhinitis 09/29/2014  . Vitamin D deficiency 09/29/2014  . Urge incontinence 09/29/2014  . Headache, tension-type 09/29/2014  . Menopausal symptom 09/29/2014    Past Surgical History:  Procedure Laterality Date  . ABDOMINAL HYSTERECTOMY  [id number]  . bladder tact    . BREAST BIOPSY Right 09/02/2015   US Guided biopsy - Ribbon shaped marker - negative  . CHOLECYSTECTOMY  [id number]  . COLONOSCOPY    . COLONOSCOPY WITH PROPOFOL N/A 02/10/2016   Procedure: COLONOSCOPY WITH PROPOFOL;  Surgeon: Lucilla Lame, MD;  Location: [place];  Service: Endoscopy;  Laterality: N/A;  . ESOPHAGOGASTRODUODENOSCOPY    . ESOPHAGOGASTRODUODENOSCOPY (EGD) WITH PROPOFOL N/A 02/10/2016   Procedure: ESOPHAGOGASTRODUODENOSCOPY (EGD) WITH PROPOFOL;  Surgeon: Lucilla Lame, MD;  Location: [place];  Service: Endoscopy;  Laterality: N/A;  sleep apnea  . LIPOMA EXCISION    . POLYPECTOMY  02/10/2016   Procedure: POLYPECTOMY;  Surgeon: Lucilla Lame, MD;  Location: [place];  Service: Endoscopy;;  . TONSILECTOMY, ADENOIDECTOMY,  BILATERAL MYRINGOTOMY AND TUBES      Family History  Problem Relation Age of Onset  . Hypertension Mother   . Arthritis Mother   . Cancer Mother        ovarian,breast, lung, liver  . Breast cancer Mother 4  . Ovarian cancer Mother 19  . Lung cancer Mother 39  . Colon cancer Brother   . Other Father        lung problems  . Ovarian cancer Maternal Grandmother        pt thinks is was ovarian but not sure  . Heart disease Brother   . Cancer Maternal Grandfather     Social History   Tobacco Use  . Smoking status: Never Smoker  . Smokeless tobacco: Never Used  Substance Use Topics  . Alcohol use: Yes    Alcohol/week: 0.0 standard drinks    Comment: rarely     Current Outpatient Medications:  .  amphetamine-dextroamphetamine (ADDERALL XR) 15 MG 24 hr capsule, Take 1 capsule by mouth every morning., Disp: 30 capsule, Rfl: 0 .  amphetamine-dextroamphetamine (ADDERALL XR) 15 MG 24 hr capsule, Take 1 capsule by mouth every morning., Disp: 30 capsule, Rfl: 0 .  amphetamine-dextroamphetamine (ADDERALL XR) 15 MG 24 hr capsule, Take 1 capsule by mouth every morning., Disp: 30 capsule, Rfl: 0 .  buPROPion (WELLBUTRIN XL) 150 MG 24 hr tablet, Every morning,  Disp: 90 tablet, Rfl: 1 .  Ca Phosphate-Cholecalciferol (CALCIUM/VITAMIN D3 GUMMIES) 200-200 MG-UNIT CHEW, Chew 1 each by mouth daily., Disp: , Rfl:  .  citalopram (CELEXA) 20 MG tablet, Daily, Disp: 90 tablet, Rfl: 1 .  levothyroxine (SYNTHROID) 200 MCG tablet, TAKE 1 TABLET BY MOUTH EVERY DAY, Disp: 90 tablet, Rfl: 0 .  Melatonin 5 MG CHEW, Chew 10 mg by mouth. , Disp: , Rfl:  .  meloxicam (MOBIC) 15 MG tablet, Take 1 tablet (15 mg total) by mouth daily as needed for pain., Disp: 90 tablet, Rfl: 0 .  omeprazole (PRILOSEC) 20 MG capsule, Twice daily, Disp: 180 capsule, Rfl: 1 .  phenylephrine (SUDAFED PE) 10 MG TABS tablet, Take by mouth., Disp: , Rfl:  .  Turmeric 500 MG CAPS, Take by mouth., Disp: , Rfl:  .  valACYclovir (VALTREX)  1000 MG tablet, Take 1 tablet (1,000 mg total) by mouth 2 (two) times daily., Disp: 30 tablet, Rfl: 0  No Known Allergies  I personally reviewed {Reviewed:14835} with the patient/caregiver today.   ROS  ***  Objective  There were no vitals filed for this visit.  There is no height or weight on file to calculate BMI.  Physical Exam ***  No results found for this or any previous visit (from the past 2160 hour(s)).  Diabetic Foot Exam: Diabetic Foot Exam - Simple   No data filed    ***  PHQ2/9: Depression screen [facility] 2/9 01/14/2020 10/17/2019 07/08/2019 04/09/2019 01/27/2019  Decreased Interest 1 0 1 0 1  Down, Depressed, Hopeless 1 0 1 0 1  PHQ - 2 Score 2 0 2 0 2  Altered sleeping 3 1 3 2 3   Tired, decreased energy 2 3 1 1 2   Change in appetite 2 0 1 1 2   Feeling bad or failure about yourself  0 0 0 0 2  Trouble concentrating 1 3 2 2 2   Moving slowly or fidgety/restless 0 0 0 0 2  Suicidal thoughts 0 0 0 0 0  PHQ-9 Score 10 7 9 6 15   Difficult doing work/chores Somewhat difficult Not difficult at all Somewhat difficult Somewhat difficult Somewhat difficult  Some recent data might be hidden    phq 9 is {gen pos GXQ:119417} ***  Fall Risk: Fall Risk  01/14/2020 10/17/2019 07/08/2019 04/09/2019 01/27/2019  Falls in the past year? 1 1 1 1  0  Number falls in past yr: 1 0 1 1 0  Comment - - - - -  Injury with Fall? 0 0 0 0 0  Comment - - - - -  Risk for fall due to : History of fall(s) Impaired balance/gait;History of fall(s) Other (Comment) - -  Risk for fall due to: Comment - - Gets dizzy and loses her footing - -   ***   Functional Status Survey:   ***   Assessment & Plan  *** There are no diagnoses linked to this encounter.

## 2020-04-27 ENCOUNTER — Ambulatory Visit: Payer: 59 | Admitting: Family Medicine

## 2020-04-27 DIAGNOSIS — Z1231 Encounter for screening mammogram for malignant neoplasm of breast: Secondary | ICD-10-CM

## 2020-05-03 NOTE — Progress Notes (Unsigned)
Name: Debra Soto   MRN: [id number]    DOB: 1960-07-12   Date:05/04/2020       Progress Note  Subjective  Chief Complaint  Follow Up  HPI  Interstitial Lung Disease: seen by Dr. Brennan Bailey 03/2019 for SOB and abnormal CT chest 02/2019 , showed some granulomatous disease also a nodule, . She went back for repeat CT. She states SOB only with moderate activity. She is not on any inhalers, she lost to follow up with pulmonologist   CT chest, 02/28/2019: 1. Findings consistent with sequelae of a nonspecific granulomatous  disease including calcified mediastinal and hilar lymph nodes, calcified  lung granulomata, and splenic calcifications.  2. Scattered groundglass opacities throughout both lungs with areas of  tree-in-bud nodularity could reflect multifocal infection including  atypical etiologies, although chronic interstitial lung disease including  sarcoidosis cannot be excluded.  3. Nonspecific 3 mm solid pulmonary nodule in the right lower lobe.  4. Moderate-sized hiatal hernia.  Repeat CT chest done 05/2019   Redemonstration of stigmata of chronic granulomatous disease including calcified mediastinal lymph nodes and calcified pulmonary nodules.  Previously noted groundglass opacities and micronodular disease has largely resolved on today's exam with only minimal residual groundglass micronodular disease noted involving the right lower lobe favored to represent a mild inflammatory/infectious process.  Scattered subcentimeter pulmonary nodules not significant changed relative to the prior exam, the largest measuring 8 x 4 mm involving the left lower lobe in which continued imaging surveillance should be considered with repeat CT examination in approximately one year. Normal PFT   Moderate mixed type hiatal hernia redemonstrated.   Positive ANA and myalgia: seen Dr. Manuella Ghazi - Rheumatologist at [facility] , she was supposed to go back in 6 months, but she lost to follow up, she is now  on Meloxicam and not celebrex . She is having more pain all over her body also on neck and back, she is taking care of her infant grandson.   Hypothyroidism: she is on 200 mcg synthroid, one pill daily , last TSH at goal, she has lost weight and discussed rechecking labs but she states skipping meals. .   Denies change in bowel movements, skin is dry but stable.   Major Depression: shewason Duloxetine but asked to go back onCitalopram and Wellbutrin08/2018, she still has memory difficulties but stable - has mental fogginess from [place] and when the pain is high symptoms gets worse  DVV:[id number] Lyrica because it causedher to get moody. She states she is always is pain,she states pain comes and goes , she takes meloxicam prn. She has noticed increase pain lately, pain level today is 4-5/10 , but can go up. Aching muscle pain.   Morbid obesity: BMI above 35 with GERD, joint aches and dyslipidemia. Discussed importance of continue weight loss   Urinary problems : she states about 7 months she noticed bright blood when wiping intermittently, s/p hysterectomy , no dysuria. She does not see urine mixed with blood. She also has hesitancy intermittently and incomplete void , she has to shift to empty bladder completely , last urine culture was negative . She states symptoms resolved, but back again lately, not seen by Urologist   Headaches: still present , now daily, usually on nuchal area, and radiates to back of her headache. Sometimes the pain is frontal also and radiates to the nuchal area. She has a history of DDD cervical spine. She states pain is described as aching like, intermittent, improves with otc medication. Discussed cutting down on  caffeine, discussed massage therapy.   Hyperlipidemia: she will try to eat healthier, she states skips meals , and drinks sodas during the day   The 10-year ASCVD risk score Mikey Bussing DC Brooke Bonito., et al., 2013) is: 3%   Values used to calculate the score:      Age: 60 years     Sex: Female     Is Non-Hispanic African American: No     Diabetic: No     Tobacco smoker: No     Systolic Blood Pressure: 272 mmHg     Is BP treated: No     HDL Cholesterol: 53 mg/dL     Total Cholesterol: 223 mg/dL    Patient Active Problem List   Diagnosis Date Noted  . Morbid obesity ([place]) 12/27/2018  . Iron deficiency anemia secondary to inadequate dietary iron intake 11/06/2017  . Special screening for malignant neoplasms, colon   . Benign neoplasm of descending colon   . Heartburn   . Gastric polyp   . Psoriasis 01/25/2016  . 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 Endometriosis 12/31/2014  . Insomnia, persistent 09/29/2014  . IBS (irritable bowel syndrome) 09/29/2014  . 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 Dyslipidemia 09/29/2014  . Fibromyalgia syndrome 09/29/2014  . Gastro-esophageal reflux disease without esophagitis 09/29/2014  . Herpes labialis 09/29/2014  . Adult hypothyroidism 09/29/2014  . Family history of malignant neoplasm of breast 09/29/2014  . Major depression, recurrent, chronic ([place]) 09/29/2014  . Obstructive apnea 09/29/2014  . Abnormal presence of protein in urine 09/29/2014  . Allergic rhinitis 09/29/2014  . Vitamin D deficiency 09/29/2014  . Urge incontinence 09/29/2014  . Headache, tension-type 09/29/2014  . Menopausal symptom 09/29/2014    Past Surgical History:  Procedure Laterality Date  . ABDOMINAL HYSTERECTOMY  [id number]  . bladder tact    . BREAST BIOPSY Right 09/02/2015   US Guided biopsy - Ribbon shaped marker - negative  . CHOLECYSTECTOMY  [id number]  . COLONOSCOPY    . COLONOSCOPY WITH PROPOFOL N/A 02/10/2016   Procedure: COLONOSCOPY WITH PROPOFOL;  Surgeon: Lucilla Lame, MD;  Location: [place];  Service: Endoscopy;  Laterality: N/A;  . ESOPHAGOGASTRODUODENOSCOPY    . ESOPHAGOGASTRODUODENOSCOPY (EGD) WITH PROPOFOL N/A 02/10/2016   Procedure: ESOPHAGOGASTRODUODENOSCOPY (EGD) WITH PROPOFOL;   Surgeon: Lucilla Lame, MD;  Location: [place];  Service: Endoscopy;  Laterality: N/A;  sleep apnea  . LIPOMA EXCISION    . POLYPECTOMY  02/10/2016   Procedure: POLYPECTOMY;  Surgeon: Lucilla Lame, MD;  Location: [place];  Service: Endoscopy;;  . TONSILECTOMY, ADENOIDECTOMY, BILATERAL MYRINGOTOMY AND TUBES      Family History  Problem Relation Age of Onset  . Hypertension Mother   . Arthritis Mother   . Cancer Mother        ovarian,breast, lung, liver  . Breast cancer Mother 15  . Ovarian cancer Mother 70  . Lung cancer Mother 40  . Colon cancer Brother   . Other Father        lung problems  . Ovarian cancer Maternal Grandmother        pt thinks is was ovarian but not sure  . Heart disease Brother   . Cancer Maternal Grandfather     Social History   Tobacco Use  . Smoking status: Never Smoker  . Smokeless tobacco: Never Used  Substance Use Topics  . Alcohol use: Yes    Alcohol/week: 0.0 standard drinks    Comment: rarely     Current Outpatient Medications:  .  amphetamine-dextroamphetamine (ADDERALL XR) 15 MG 24 hr capsule, Take 1 capsule by mouth every morning., Disp: 30 capsule, Rfl: 0 .  amphetamine-dextroamphetamine (ADDERALL XR) 15 MG 24 hr capsule, Take 1 capsule by mouth every morning., Disp: 30 capsule, Rfl: 0 .  amphetamine-dextroamphetamine (ADDERALL XR) 15 MG 24 hr capsule, Take 1 capsule by mouth every morning., Disp: 30 capsule, Rfl: 0 .  buPROPion (WELLBUTRIN XL) 150 MG 24 hr tablet, Every morning, Disp: 90 tablet, Rfl: 1 .  Ca Phosphate-Cholecalciferol 200-200 MG-UNIT CHEW, Chew 1 each by mouth daily., Disp: , Rfl:  .  citalopram (CELEXA) 20 MG tablet, Daily, Disp: 90 tablet, Rfl: 1 .  levothyroxine (SYNTHROID) 200 MCG tablet, TAKE 1 TABLET BY MOUTH EVERY DAY, Disp: 90 tablet, Rfl: 0 .  Melatonin 5 MG CHEW, Chew 10 mg by mouth. , Disp: , Rfl:  .  meloxicam (MOBIC) 15 MG tablet, Take 1 tablet (15 mg total) by mouth daily as needed for  pain., Disp: 90 tablet, Rfl: 0 .  omeprazole (PRILOSEC) 20 MG capsule, Twice daily, Disp: 180 capsule, Rfl: 1 .  phenylephrine (SUDAFED PE) 10 MG TABS tablet, Take by mouth., Disp: , Rfl:  .  Turmeric 500 MG CAPS, Take by mouth., Disp: , Rfl:  .  valACYclovir (VALTREX) 1000 MG tablet, Take 1 tablet (1,000 mg total) by mouth 2 (two) times daily., Disp: 30 tablet, Rfl: 0  No Known Allergies  I personally reviewed active problem list, medication list, allergies, family history, social history, health maintenance with the patient/caregiver today.   ROS  Constitutional: Negative for fever, positive for  weight change.  Respiratory: Negative for cough and shortness of breath.   Cardiovascular: Negative for chest pain or palpitations.  Gastrointestinal: Negative for abdominal pain, no bowel changes.  Musculoskeletal: Negative for gait problem or joint swelling.  Skin: Negative for rash.   Neurological: Negative for dizziness or headache.  No other specific complaints in a complete review of systems (except as listed in HPI above).   Objective  Vitals:   05/04/20 1128  BP: 120/82  Pulse: 100  Resp: 18  Temp: 98 F (36.7 C)  TempSrc: Oral  SpO2: 100%  Weight: 257 lb 1.6 oz (116.6 kg)  Height: 5\' 8"  (1.727 m)    Body mass index is 39.09 kg/m.  Physical Exam  Constitutional: Patient appears well-developed and well-nourished. Obese  No distress.  HEENT: head atraumatic, normocephalic, pupils equal and reactive to light, neck supple Cardiovascular: Normal rate, regular rhythm and normal heart sounds.  No murmur heard. No BLE edema. Pulmonary/Chest: Effort normal and breath sounds normal. No respiratory distress. Abdominal: Soft.  There is no tenderness. Muscular skeletal: trigger point positive  Psychiatric: Patient has a normal mood and affect. behavior is normal. Judgment and thought content normal.  PHQ2/9: Depression screen [facility] 2/9 05/04/2020 01/14/2020 10/17/2019 07/08/2019  04/09/2019  Decreased Interest 0 1 0 1 0  Down, Depressed, Hopeless 0 1 0 1 0  PHQ - 2 Score 0 2 0 2 0  Altered sleeping 3 3 1 3 2   Tired, decreased energy 2 2 3 1 1   Change in appetite 3 2 0 1 1  Feeling bad or failure about yourself  0 0 0 0 0  Trouble concentrating 1 1 3 2 2   Moving slowly or fidgety/restless 0 0 0 0 0  Suicidal thoughts 0 0 0 0 0  PHQ-9 Score 9 10 7 9 6   Difficult doing work/chores Not difficult at all Somewhat difficult Not  difficult at all Somewhat difficult Somewhat difficult  Some recent data might be hidden    phq 9 is positive   Fall Risk: Fall Risk  05/04/2020 01/14/2020 10/17/2019 07/08/2019 04/09/2019  Falls in the past year? 1 1 1 1 1   Number falls in past yr: 1 1 0 1 1  Comment - - - - -  Injury with Fall? 0 0 0 0 0  Comment - - - - -  Risk for fall due to : History of fall(s) History of fall(s) Impaired balance/gait;History of fall(s) Other (Comment) -  Risk for fall due to: Comment - - - Gets dizzy and loses her footing -  Follow up Falls prevention discussed - - - -     Functional Status Survey: Is the patient deaf or have difficulty hearing?: Yes Does the patient have difficulty seeing, even when wearing glasses/contacts?: Yes Does the patient have difficulty concentrating, remembering, or making decisions?: Yes Does the patient have difficulty walking or climbing stairs?: No Does the patient have difficulty dressing or bathing?: No Does the patient have difficulty doing errands alone such as visiting a doctor's office or shopping?: No    Assessment & Plan  1. Morbid obesity ([place])  Discussed with the patient the risk posed by an increased BMI. Discussed importance of portion control, calorie counting and at least 150 minutes of physical activity weekly. Avoid sweet beverages and drink more water. Eat at least 6 servings of fruit and vegetables daily   2. Vitamin D deficiency   3. OSA (obstructive sleep apnea)   4. B12  deficiency   5. Major depression, recurrent, chronic ([facility])  - amphetamine-dextroamphetamine (ADDERALL XR) 15 MG 24 hr capsule; Take 1 capsule by mouth every morning.  Dispense: 30 capsule; Refill: 0 - amphetamine-dextroamphetamine (ADDERALL XR) 15 MG 24 hr capsule; Take 1 capsule by mouth every morning.  Dispense: 30 capsule; Refill: 0 - amphetamine-dextroamphetamine (ADDERALL XR) 15 MG 24 hr capsule; Take 1 capsule by mouth every morning.  Dispense: 30 capsule; Refill: 0 - buPROPion (WELLBUTRIN XL) 150 MG 24 hr tablet; Every morning  Dispense: 90 tablet; Refill: 1 - citalopram (CELEXA) 20 MG tablet; Daily  Dispense: 90 tablet; Refill: 1  6. Fibromyalgia syndrome  - amphetamine-dextroamphetamine (ADDERALL XR) 15 MG 24 hr capsule; Take 1 capsule by mouth every morning.  Dispense: 30 capsule; Refill: 0 - amphetamine-dextroamphetamine (ADDERALL XR) 15 MG 24 hr capsule; Take 1 capsule by mouth every morning.  Dispense: 30 capsule; Refill: 0 - amphetamine-dextroamphetamine (ADDERALL XR) 15 MG 24 hr capsule; Take 1 capsule by mouth every morning.  Dispense: 30 capsule; Refill: 0  7. Dyslipidemia   8. Breast cancer screening by mammogram  - MM 3D SCREEN BREAST BILATERAL; Future  9. Adult hypothyroidism  - levothyroxine (SYNTHROID) 200 MCG tablet; TAKE 1 TABLET BY MOUTH EVERY DAY  Dispense: 90 tablet; Refill: 0  10. Gastro-esophageal reflux disease without esophagitis  - omeprazole (PRILOSEC) 20 MG capsule; Twice daily  Dispense: 180 capsule; Refill: 1  11. Positive ANA (antinuclear antibody)   12. Primary insomnia  Stable   13. Interstitial lung disease ([place])  Explained importance of seeing pulmonologist   14. Need for shingles vaccine  - Varicella-zoster vaccine IM

## 2020-05-04 ENCOUNTER — Ambulatory Visit (INDEPENDENT_AMBULATORY_CARE_PROVIDER_SITE_OTHER): Payer: 59 | Admitting: Family Medicine

## 2020-05-04 ENCOUNTER — Other Ambulatory Visit: Payer: Self-pay

## 2020-05-04 ENCOUNTER — Encounter: Payer: Self-pay | Admitting: Family Medicine

## 2020-05-04 DIAGNOSIS — E538 Deficiency of other specified B group vitamins: Secondary | ICD-10-CM

## 2020-05-04 DIAGNOSIS — E559 Vitamin D deficiency, unspecified: Secondary | ICD-10-CM

## 2020-05-04 DIAGNOSIS — G4733 Obstructive sleep apnea (adult) (pediatric): Secondary | ICD-10-CM

## 2020-05-04 DIAGNOSIS — Z23 Encounter for immunization: Secondary | ICD-10-CM

## 2020-05-04 DIAGNOSIS — F5101 Primary insomnia: Secondary | ICD-10-CM

## 2020-05-04 DIAGNOSIS — E039 Hypothyroidism, unspecified: Secondary | ICD-10-CM

## 2020-05-04 DIAGNOSIS — J849 Interstitial pulmonary disease, unspecified: Secondary | ICD-10-CM

## 2020-05-04 DIAGNOSIS — R768 Other specified abnormal immunological findings in serum: Secondary | ICD-10-CM

## 2020-05-04 DIAGNOSIS — M797 Fibromyalgia: Secondary | ICD-10-CM

## 2020-05-04 DIAGNOSIS — K219 Gastro-esophageal reflux disease without esophagitis: Secondary | ICD-10-CM

## 2020-05-04 DIAGNOSIS — E785 Hyperlipidemia, unspecified: Secondary | ICD-10-CM

## 2020-05-04 DIAGNOSIS — Z1231 Encounter for screening mammogram for malignant neoplasm of breast: Secondary | ICD-10-CM

## 2020-05-04 DIAGNOSIS — F339 Major depressive disorder, recurrent, unspecified: Secondary | ICD-10-CM

## 2020-05-04 MED ORDER — AMPHETAMINE-DEXTROAMPHET ER 15 MG PO CP24: 15.0000 mg | ORAL_CAPSULE | ORAL | 0 refills | Status: DC

## 2020-05-04 MED ORDER — OMEPRAZOLE 20 MG PO CPDR: DELAYED_RELEASE_CAPSULE | ORAL | 1 refills | Status: DC

## 2020-05-04 MED ORDER — LEVOTHYROXINE SODIUM 200 MCG PO TABS: ORAL_TABLET | ORAL | 0 refills | Status: DC

## 2020-05-04 MED ORDER — CITALOPRAM HYDROBROMIDE 20 MG PO TABS: ORAL_TABLET | ORAL | 1 refills | Status: DC

## 2020-05-04 MED ORDER — BUPROPION HCL ER (XL) 150 MG PO TB24: ORAL_TABLET | ORAL | 1 refills | Status: DC

## 2020-05-04 NOTE — Addendum Note (Signed)
Addended by: Steele Sizer F on: 05/04/2020 12:20 PM   Modules accepted: Orders

## 2020-06-11 ENCOUNTER — Other Ambulatory Visit: Payer: Self-pay | Admitting: Family Medicine

## 2020-06-11 DIAGNOSIS — B001 Herpesviral vesicular dermatitis: Secondary | ICD-10-CM

## 2020-06-11 NOTE — Telephone Encounter (Signed)
}    Notes to clinic:  Script has expired  Review for continued use and refill    Requested Prescriptions  Pending Prescriptions Disp Refills   valACYclovir (VALTREX) 1000 MG tablet [Pharmacy Med Name: VALACYCLOVIR 1GM TABLETS] 30 tablet 0    Sig: TAKE 1 TABLET(1000 MG) BY MOUTH TWICE DAILY      Antimicrobials:  Antiviral Agents - Anti-Herpetic Passed - 06/11/2020  9:34 AM      Passed - Valid encounter within last 12 months    Recent Outpatient Visits           1 month ago Morbid obesity [facility])   [facility] Steele Sizer, MD   4 months ago Major depression, recurrent, chronic [facility])   [facility] Steele Sizer, MD   7 months ago Adult hypothyroidism   [facility] Steele Sizer, MD   11 months ago Adult hypothyroidism   [facility] Steele Sizer, MD   1 year ago Positive ANA (antinuclear antibody)   [facility] Steele Sizer, MD       Future Appointments             In 2 months Ancil Boozer, Drue Stager, MD [facility], [facility]

## 2020-08-05 NOTE — Progress Notes (Signed)
Name: Debra Soto   MRN: [id number]    DOB: 10-27-1960   Date:08/10/2020       Progress Note  Subjective  Chief Complaint  Follow Up  HPI  Interstitial Lung Disease: seen by Dr. Brennan Bailey 03/2019 for SOB and abnormal CT chest 02/2019 , showed some granulomatous disease also a nodule, She states SOB only with moderate activity. She is not on any inhalers, she lost to follow up with pulmonologist Advised her to go back . Explained she was due for repeat CT 05/2020   CT chest, 02/28/2019: 1. Findings consistent with sequelae of a nonspecific granulomatous  disease including calcified mediastinal and hilar lymph nodes, calcified  lung granulomata, and splenic calcifications.  2. Scattered groundglass opacities throughout both lungs with areas of  tree-in-bud nodularity could reflect multifocal infection including  atypical etiologies, although chronic interstitial lung disease including  sarcoidosis cannot be excluded.  3. Nonspecific 3 mm solid pulmonary nodule in the right lower lobe.  4. Moderate-sized hiatal hernia.  Repeat CT chest done 05/2019   Redemonstration of stigmata of chronic granulomatous disease including calcified mediastinal lymph nodes and calcified pulmonary nodules.  Previously noted groundglass opacities and micronodular disease has largely resolved on today's exam with only minimal residual groundglass micronodular disease noted involving the right lower lobe favored to represent a mild inflammatory/infectious process.  Scattered subcentimeter pulmonary nodules not significant changed relative to the prior exam, the largest measuring 8 x 4 mm involving the left lower lobe in which continued imaging surveillance should be considered with repeat CT examination in approximately one year. Normal PFT   Moderate mixed type hiatal hernia redemonstrated.   Positive ANA and myalgia: seen Dr. Manuella Ghazi - Rheumatologist at [facility] , she was supposed to go back in 6 months,  but she lost to follow up, she is now on MeloxicamShe is having more pain all over her body also on neck and back, she is taking care of her infant grandson, he was born Dec 2021   Hypothyroidism: she is on 200 mcg synthroid, one pill daily , last TSH at goal, she lost more weight since last visit but wants to hold off to recheck labs .   Denies change in bowel movements, skin is dry but stable.   Major Depression: shewason Duloxetine but asked to go back onCitalopram and Wellbutrin08/2018, she still has memory difficulties but stable - has mental fogginess from [place]. She states happy to have a grandson and having her family around . She has ADD and is taking medication as prescribed   JOI:NOMVEHMCNO Lyrica because it causedher to get moody. She states she is always is pain,she states pain comes and goes , she takes meloxicam prn. She has been having more pain lately, worse on lower back.   Chronic low back pain: going on for about 4 months, started without trauma, pain is constant on lower back, radiate across lower back. Pain is worse at night, has to shift her body due to FMS, but when she first gets up she feels very stiff and hard to walk. Pain can be 10/10, no radiculitis, denies bowel or bladder incontinence. She states hot shower helps and also stretching exercises .   Morbid obesity: BMI above 35 with GERD, joint aches and dyslipidemia. She has lost 7 lbs since last visit, she states she does not eat a lot but drinking her calories   Headaches: still present , now daily, usually on nuchal area, and radiates to back of her headache. Sometimes  the pain is frontal also and radiates to the nuchal area. She has a history of DDD cervical spine. She states pain is described as aching like, intermittent, improves with otc medication. Discussed cutting down on caffeine,, she drinks Dr. Malachi Bonds and sweet tea all day long   Hyperlipidemia: she is not eating a balanced diet, she skips meals and  drinks sodas   The 10-year ASCVD risk score Mikey Bussing DC Jr., et al., 2013) is: 3.8%   Values used to calculate the score:     Age: 60 years     Sex: Female     Is Non-Hispanic African American: No     Diabetic: No     Tobacco smoker: No     Systolic Blood Pressure: 277 mmHg     Is BP treated: No     HDL Cholesterol: 53 mg/dL     Total Cholesterol: 223 mg/dL   Patient Active Problem List   Diagnosis Date Noted  . Morbid obesity ([place]) 12/27/2018  . Iron deficiency anemia secondary to inadequate dietary iron intake 11/06/2017  . Special screening for malignant neoplasms, colon   . Benign neoplasm of descending colon   . Heartburn   . Gastric polyp   . Psoriasis 01/25/2016  . 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 Endometriosis 12/31/2014  . Insomnia, persistent 09/29/2014  . IBS (irritable bowel syndrome) 09/29/2014  . 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 Dyslipidemia 09/29/2014  . Fibromyalgia syndrome 09/29/2014  . Gastro-esophageal reflux disease without esophagitis 09/29/2014  . Herpes labialis 09/29/2014  . Adult hypothyroidism 09/29/2014  . Family history of malignant neoplasm of breast 09/29/2014  . Major depression, recurrent, chronic ([place]) 09/29/2014  . Obstructive apnea 09/29/2014  . Abnormal presence of protein in urine 09/29/2014  . Allergic rhinitis 09/29/2014  . Vitamin D deficiency 09/29/2014  . Urge incontinence 09/29/2014  . Headache, tension-type 09/29/2014  . Menopausal symptom 09/29/2014    Past Surgical History:  Procedure Laterality Date  . ABDOMINAL HYSTERECTOMY  [id number]  . bladder tact    . BREAST BIOPSY Right 09/02/2015   US Guided biopsy - Ribbon shaped marker - negative  . CHOLECYSTECTOMY  [id number]  . COLONOSCOPY    . COLONOSCOPY WITH PROPOFOL N/A 02/10/2016   Procedure: COLONOSCOPY WITH PROPOFOL;  Surgeon: Lucilla Lame, MD;  Location: [place];  Service: Endoscopy;  Laterality: N/A;  . ESOPHAGOGASTRODUODENOSCOPY    .  ESOPHAGOGASTRODUODENOSCOPY (EGD) WITH PROPOFOL N/A 02/10/2016   Procedure: ESOPHAGOGASTRODUODENOSCOPY (EGD) WITH PROPOFOL;  Surgeon: Lucilla Lame, MD;  Location: [place];  Service: Endoscopy;  Laterality: N/A;  sleep apnea  . LIPOMA EXCISION    . POLYPECTOMY  02/10/2016   Procedure: POLYPECTOMY;  Surgeon: Lucilla Lame, MD;  Location: [place];  Service: Endoscopy;;  . TONSILECTOMY, ADENOIDECTOMY, BILATERAL MYRINGOTOMY AND TUBES      Family History  Problem Relation Age of Onset  . Hypertension Mother   . Arthritis Mother   . Cancer Mother        ovarian,breast, lung, liver  . Breast cancer Mother 39  . Ovarian cancer Mother 76  . Lung cancer Mother 88  . Colon cancer Brother   . Other Father        lung problems  . Ovarian cancer Maternal Grandmother        pt thinks is was ovarian but not sure  . Heart disease Brother   . Cancer Maternal Grandfather     Social History   Tobacco Use  . Smoking  status: Never Smoker  . Smokeless tobacco: Never Used  Substance Use Topics  . Alcohol use: Yes    Alcohol/week: 0.0 standard drinks    Comment: rarely     Current Outpatient Medications:  .  amphetamine-dextroamphetamine (ADDERALL XR) 15 MG 24 hr capsule, Take 1 capsule by mouth every morning., Disp: 30 capsule, Rfl: 0 .  amphetamine-dextroamphetamine (ADDERALL XR) 15 MG 24 hr capsule, Take 1 capsule by mouth every morning., Disp: 30 capsule, Rfl: 0 .  amphetamine-dextroamphetamine (ADDERALL XR) 15 MG 24 hr capsule, Take 1 capsule by mouth every morning., Disp: 30 capsule, Rfl: 0 .  buPROPion (WELLBUTRIN XL) 150 MG 24 hr tablet, Every morning, Disp: 90 tablet, Rfl: 1 .  Ca Phosphate-Cholecalciferol 200-200 MG-UNIT CHEW, Chew 1 each by mouth daily., Disp: , Rfl:  .  citalopram (CELEXA) 20 MG tablet, Daily, Disp: 90 tablet, Rfl: 1 .  levothyroxine (SYNTHROID) 200 MCG tablet, TAKE 1 TABLET BY MOUTH EVERY DAY, Disp: 90 tablet, Rfl: 0 .  Melatonin 5 MG CHEW, Chew  10 mg by mouth. , Disp: , Rfl:  .  meloxicam (MOBIC) 15 MG tablet, Take 1 tablet (15 mg total) by mouth daily as needed for pain., Disp: 90 tablet, Rfl: 0 .  omeprazole (PRILOSEC) 20 MG capsule, Twice daily, Disp: 180 capsule, Rfl: 1 .  phenylephrine (SUDAFED PE) 10 MG TABS tablet, Take by mouth., Disp: , Rfl:  .  Turmeric 500 MG CAPS, Take by mouth., Disp: , Rfl:  .  valACYclovir (VALTREX) 1000 MG tablet, TAKE 1 TABLET(1000 MG) BY MOUTH TWICE DAILY, Disp: 30 tablet, Rfl: 0  No Known Allergies  I personally reviewed active problem list, medication list, allergies, family history, social history, health maintenance with the patient/caregiver today.   ROS  Constitutional: Negative for fever, positive for weight change - lost 7 lbs .  Respiratory: Negative for cough and shortness of breath.   Cardiovascular: Negative for chest pain or palpitations.  Gastrointestinal: Negative for abdominal pain, no bowel changes.  Musculoskeletal: positive  for gait problem - difficulty walking when she first gets up in am's, but no  joint swelling.  Skin: Negative for rash.  Neurological: Negative for dizziness or headache.  No other specific complaints in a complete review of systems (except as listed in HPI above).  Objective  Vitals:   08/10/20 1122  BP: 130/84  Pulse: 94  Resp: 16  Temp: 98 F (36.7 C)  TempSrc: Oral  SpO2: 98%  Weight: 250 lb (113.4 kg)  Height: 5\' 8"  (1.727 m)    Body mass index is 38.01 kg/m.  Physical Exam  Constitutional: Patient appears well-developed and well-nourished. Obese  No distress.  HEENT: head atraumatic, normocephalic, pupils equal and reactive to light,  neck supple Cardiovascular: Normal rate, regular rhythm and normal heart sounds.  No murmur heard. No BLE edema. Pulmonary/Chest: Effort normal and breath sounds normal. No respiratory distress. Abdominal: Soft.  There is no tenderness. Muscular skeletal: trigger point positive, pain during  palpation of lumbar spine, negative straight leg raise, pain with rom of back  Psychiatric: Patient has a normal mood and affect. behavior is normal. Judgment and thought content normal.  PHQ2/9: Depression screen [facility] 2/9 08/10/2020 05/04/2020 01/14/2020 10/17/2019 07/08/2019  Decreased Interest 0 0 1 0 1  Down, Depressed, Hopeless 0 0 1 0 1  PHQ - 2 Score 0 0 2 0 2  Altered sleeping 3 3 3 1 3   Tired, decreased energy - 2 2 3 1   Change  in appetite 3 3 2  0 1  Feeling bad or failure about yourself  0 0 0 0 0  Trouble concentrating 3 1 1 3 2   Moving slowly or fidgety/restless 0 0 0 0 0  Suicidal thoughts 0 0 0 0 0  PHQ-9 Score 9 9 10 7 9   Difficult doing work/chores - Not difficult at all Somewhat difficult Not difficult at all Somewhat difficult  Some recent data might be hidden    phq 9 is positive   Fall Risk: Fall Risk  08/10/2020 05/04/2020 01/14/2020 10/17/2019 07/08/2019  Falls in the past year? 0 1 1 1 1   Number falls in past yr: 0 1 1 0 1  Comment - - - - -  Injury with Fall? 0 0 0 0 0  Comment - - - - -  Risk for fall due to : - History of fall(s) History of fall(s) Impaired balance/gait;History of fall(s) Other (Comment)  Risk for fall due to: Comment - - - - Gets dizzy and loses her footing  Follow up - Falls prevention discussed - - -     Functional Status Survey: Is the patient deaf or have difficulty hearing?: No Does the patient have difficulty seeing, even when wearing glasses/contacts?: No Does the patient have difficulty concentrating, remembering, or making decisions?: No Does the patient have difficulty walking or climbing stairs?: No Does the patient have difficulty dressing or bathing?: No Does the patient have difficulty doing errands alone such as visiting a doctor's office or shopping?: No    Assessment & Plan  1. Morbid obesity ([place])  Discussed with the patient the risk posed by an increased BMI. Discussed importance of portion control, calorie counting  and at least 150 minutes of physical activity weekly. Avoid sweet beverages and drink more water. Eat at least 6 servings of fruit and vegetables daily   2. Vitamin D deficiency  Resume supplementation   3. OSA (obstructive sleep apnea)   4. Fibromyalgia syndrome  - amphetamine-dextroamphetamine (ADDERALL XR) 15 MG 24 hr capsule; Take 1 capsule by mouth every morning.  Dispense: 30 capsule; Refill: 0 - amphetamine-dextroamphetamine (ADDERALL XR) 15 MG 24 hr capsule; Take 1 capsule by mouth every morning.  Dispense: 30 capsule; Refill: 0 - amphetamine-dextroamphetamine (ADDERALL XR) 15 MG 24 hr capsule; Take 1 capsule by mouth every morning.  Dispense: 30 capsule; Refill: 0 - baclofen (LIORESAL) 10 MG tablet; Take 1 tablet (10 mg total) by mouth at bedtime.  Dispense: 90 each; Refill: 0  5. B12 deficiency   6. Dyslipidemia   7. Adult hypothyroidism  Recheck level next visit   8. Gastro-esophageal reflux disease without esophagitis   9. Interstitial lung disease ([place])  She needs to contact pulmonologist   10. Primary insomnia   11. Tension headache  Needs to cut down on caffeine   12. Chronic bilateral low back pain without sciatica  - baclofen (LIORESAL) 10 MG tablet; Take 1 tablet (10 mg total) by mouth at bedtime.  Dispense: 90 each; Refill: 0  13. Major depression, recurrent, chronic ([facility])  - amphetamine-dextroamphetamine (ADDERALL XR) 15 MG 24 hr capsule; Take 1 capsule by mouth every morning.  Dispense: 30 capsule; Refill: 0 - amphetamine-dextroamphetamine (ADDERALL XR) 15 MG 24 hr capsule; Take 1 capsule by mouth every morning.  Dispense: 30 capsule; Refill: 0 - amphetamine-dextroamphetamine (ADDERALL XR) 15 MG 24 hr capsule; Take 1 capsule by mouth every morning.  Dispense: 30 capsule; Refill: 0  14. Arthralgia, unspecified joint  -  meloxicam (MOBIC) 15 MG tablet; Take 1 tablet (15 mg total) by mouth daily as needed for pain.  Dispense: 90 tablet; Refill:  0  15. Positive ANA (antinuclear antibody)  - meloxicam (MOBIC) 15 MG tablet; Take 1 tablet (15 mg total) by mouth daily as needed for pain.  Dispense: 90 tablet; Refill: 0  16. Thrombocytosis  Recheck labs next visit

## 2020-08-10 ENCOUNTER — Ambulatory Visit (INDEPENDENT_AMBULATORY_CARE_PROVIDER_SITE_OTHER): Payer: 59 | Admitting: Family Medicine

## 2020-08-10 ENCOUNTER — Encounter: Payer: Self-pay | Admitting: Family Medicine

## 2020-08-10 ENCOUNTER — Other Ambulatory Visit: Payer: Self-pay | Admitting: Family Medicine

## 2020-08-10 ENCOUNTER — Other Ambulatory Visit: Payer: Self-pay

## 2020-08-10 DIAGNOSIS — M2550 Pain in unspecified joint: Secondary | ICD-10-CM

## 2020-08-10 DIAGNOSIS — M797 Fibromyalgia: Secondary | ICD-10-CM

## 2020-08-10 DIAGNOSIS — F5101 Primary insomnia: Secondary | ICD-10-CM

## 2020-08-10 DIAGNOSIS — G4733 Obstructive sleep apnea (adult) (pediatric): Secondary | ICD-10-CM

## 2020-08-10 DIAGNOSIS — E559 Vitamin D deficiency, unspecified: Secondary | ICD-10-CM | POA: Diagnosis not present

## 2020-08-10 DIAGNOSIS — E785 Hyperlipidemia, unspecified: Secondary | ICD-10-CM

## 2020-08-10 DIAGNOSIS — D75839 Thrombocytosis, unspecified: Secondary | ICD-10-CM

## 2020-08-10 DIAGNOSIS — K219 Gastro-esophageal reflux disease without esophagitis: Secondary | ICD-10-CM

## 2020-08-10 DIAGNOSIS — G44209 Tension-type headache, unspecified, not intractable: Secondary | ICD-10-CM

## 2020-08-10 DIAGNOSIS — E039 Hypothyroidism, unspecified: Secondary | ICD-10-CM

## 2020-08-10 DIAGNOSIS — R768 Other specified abnormal immunological findings in serum: Secondary | ICD-10-CM

## 2020-08-10 DIAGNOSIS — G8929 Other chronic pain: Secondary | ICD-10-CM

## 2020-08-10 DIAGNOSIS — J849 Interstitial pulmonary disease, unspecified: Secondary | ICD-10-CM

## 2020-08-10 DIAGNOSIS — E538 Deficiency of other specified B group vitamins: Secondary | ICD-10-CM

## 2020-08-10 DIAGNOSIS — F339 Major depressive disorder, recurrent, unspecified: Secondary | ICD-10-CM

## 2020-08-10 DIAGNOSIS — M545 Low back pain, unspecified: Secondary | ICD-10-CM

## 2020-08-10 MED ORDER — BACLOFEN 10 MG PO TABS: 10.0000 mg | ORAL_TABLET | Freq: Every evening | ORAL | 0 refills | Status: DC

## 2020-08-10 MED ORDER — AMPHETAMINE-DEXTROAMPHET ER 15 MG PO CP24
15.0000 mg | ORAL_CAPSULE | ORAL | 0 refills | Status: DC
Start: 2020-08-10 — End: 2020-11-12

## 2020-08-10 MED ORDER — AMPHETAMINE-DEXTROAMPHET ER 15 MG PO CP24: 15.0000 mg | ORAL_CAPSULE | ORAL | 0 refills | Status: DC

## 2020-08-10 MED ORDER — MELOXICAM 15 MG PO TABS: 15.0000 mg | ORAL_TABLET | Freq: Every day | ORAL | 0 refills | Status: DC | PRN

## 2020-11-02 NOTE — Progress Notes (Deleted)
Name: Debra Soto   MRN: [id number]    DOB: Nov 13, 1960   Date:11/02/2020       Progress Note  Subjective  Chief Complaint  Back/ Leg Pain  HPI  *** Patient Active Problem List   Diagnosis Date Noted   Morbid obesity ([place]) 12/27/2018   Iron deficiency anemia secondary to inadequate dietary iron intake 11/06/2017   Special screening for malignant neoplasms, colon    Benign neoplasm of descending colon    Heartburn    Gastric polyp    Psoriasis 01/25/2016   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/22/2015   Endometriosis 12/31/2014   Insomnia, persistent 09/29/2014   IBS (irritable bowel syndrome) 09/29/2014   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/2014   Dyslipidemia 09/29/2014   Fibromyalgia syndrome 09/29/2014   Gastro-esophageal reflux disease without esophagitis 09/29/2014   Herpes labialis 09/29/2014   Adult hypothyroidism 09/29/2014   Family history of malignant neoplasm of breast 09/29/2014   Major depression, recurrent, chronic ([place]) 09/29/2014   Obstructive apnea 09/29/2014   Abnormal presence of protein in urine 09/29/2014   Allergic rhinitis 09/29/2014   Vitamin D deficiency 09/29/2014   Urge incontinence 09/29/2014   Headache, tension-type 09/29/2014   Menopausal symptom 09/29/2014    Past Surgical History:  Procedure Laterality Date   ABDOMINAL HYSTERECTOMY  [id number]   bladder tact     BREAST BIOPSY Right 09/02/2015   US Guided biopsy - Ribbon shaped marker - negative   CHOLECYSTECTOMY  [id number]   COLONOSCOPY     COLONOSCOPY WITH PROPOFOL N/A 02/10/2016   Procedure: COLONOSCOPY WITH PROPOFOL;  Surgeon: Lucilla Lame, MD;  Location: [place];  Service: Endoscopy;  Laterality: N/A;   ESOPHAGOGASTRODUODENOSCOPY     ESOPHAGOGASTRODUODENOSCOPY (EGD) WITH PROPOFOL N/A 02/10/2016   Procedure: ESOPHAGOGASTRODUODENOSCOPY (EGD) WITH PROPOFOL;  Surgeon: Lucilla Lame, MD;  Location: [place];  Service: Endoscopy;  Laterality: N/A;  sleep apnea   LIPOMA EXCISION      POLYPECTOMY  02/10/2016   Procedure: POLYPECTOMY;  Surgeon: Lucilla Lame, MD;  Location: [place];  Service: Endoscopy;;   TONSILECTOMY, ADENOIDECTOMY, BILATERAL MYRINGOTOMY AND TUBES      Family History  Problem Relation Age of Onset   Hypertension Mother    Arthritis Mother    Cancer Mother        ovarian,breast, lung, liver   Breast cancer Mother 33   Ovarian cancer Mother 48   Lung cancer Mother 74   Colon cancer Brother    Other Father        lung problems   Ovarian cancer Maternal Grandmother        pt thinks is was ovarian but not sure   Heart disease Brother    Cancer Maternal Grandfather     Social History   Tobacco Use   Smoking status: Never   Smokeless tobacco: Never  Substance Use Topics   Alcohol use: Yes    Alcohol/week: 0.0 standard drinks    Comment: rarely     Current Outpatient Medications:    amphetamine-dextroamphetamine (ADDERALL XR) 15 MG 24 hr capsule, Take 1 capsule by mouth every morning., Disp: 30 capsule, Rfl: 0   amphetamine-dextroamphetamine (ADDERALL XR) 15 MG 24 hr capsule, Take 1 capsule by mouth every morning., Disp: 30 capsule, Rfl: 0   amphetamine-dextroamphetamine (ADDERALL XR) 15 MG 24 hr capsule, Take 1 capsule by mouth every morning., Disp: 30 capsule, Rfl: 0   baclofen (LIORESAL) 10 MG tablet, Take 1 tablet (10 mg total) by mouth at bedtime., Disp: 90  each, Rfl: 0   buPROPion (WELLBUTRIN XL) 150 MG 24 hr tablet, Every morning, Disp: 90 tablet, Rfl: 1   Ca Phosphate-Cholecalciferol 200-200 MG-UNIT CHEW, Chew 1 each by mouth daily., Disp: , Rfl:    citalopram (CELEXA) 20 MG tablet, Daily, Disp: 90 tablet, Rfl: 1   levothyroxine (SYNTHROID) 200 MCG tablet, TAKE 1 TABLET BY MOUTH EVERY DAY, Disp: 90 tablet, Rfl: 0   Melatonin 5 MG CHEW, Chew 10 mg by mouth. , Disp: , Rfl:    meloxicam (MOBIC) 15 MG tablet, Take 1 tablet (15 mg total) by mouth daily as needed for pain., Disp: 90 tablet, Rfl: 0   omeprazole (PRILOSEC) 20  MG capsule, Twice daily, Disp: 180 capsule, Rfl: 1   phenylephrine (SUDAFED PE) 10 MG TABS tablet, Take by mouth., Disp: , Rfl:    Turmeric 500 MG CAPS, Take by mouth., Disp: , Rfl:    valACYclovir (VALTREX) 1000 MG tablet, TAKE 1 TABLET(1000 MG) BY MOUTH TWICE DAILY, Disp: 30 tablet, Rfl: 0  No Known Allergies  I personally reviewed {[id number]} with the patient/caregiver today.   ROS  ***  Objective  There were no vitals filed for this visit.  There is no height or weight on file to calculate BMI.  Physical Exam ***  No results found for this or any previous visit (from the past 2160 hour(s)).  Diabetic Foot Exam: Diabetic Foot Exam - Simple   No data filed    ***  PHQ2/9: Depression screen [facility] 2/9 08/10/2020 05/04/2020 01/14/2020 10/17/2019 07/08/2019  Decreased Interest 0 0 1 0 1  Down, Depressed, Hopeless 0 0 1 0 1  PHQ - 2 Score 0 0 2 0 2  Altered sleeping '3 3 3 1 3  '$ Tired, decreased energy - '2 2 3 1  '$ Change in appetite '3 3 2 '$ 0 1  Feeling bad or failure about yourself  0 0 0 0 0  Trouble concentrating '3 1 1 3 2  '$ Moving slowly or fidgety/restless 0 0 0 0 0  Suicidal thoughts 0 0 0 0 0  PHQ-9 Score '9 9 10 7 9  '$ Difficult doing work/chores - Not difficult at all Somewhat difficult Not difficult at all Somewhat difficult  Some recent data might be hidden    phq 9 is {gen pos [id number] ***  Fall Risk: Fall Risk  08/10/2020 05/04/2020 01/14/2020 10/17/2019 07/08/2019  Falls in the past year? 0 '1 1 1 1  '$ Number falls in past yr: 0 1 1 0 1  Comment - - - - -  Injury with Fall? 0 0 0 0 0  Comment - - - - -  Risk for fall due to : - History of fall(s) History of fall(s) Impaired balance/gait;History of fall(s) Other (Comment)  Risk for fall due to: Comment - - - - Gets dizzy and loses her footing  Follow up - Falls prevention discussed - - -   ***   Functional Status Survey:   ***   Assessment & Plan  *** There are no diagnoses linked to this encounter.

## 2020-11-03 ENCOUNTER — Ambulatory Visit: Payer: 59 | Admitting: Family Medicine

## 2020-11-08 ENCOUNTER — Other Ambulatory Visit: Payer: Self-pay | Admitting: Family Medicine

## 2020-11-08 DIAGNOSIS — F339 Major depressive disorder, recurrent, unspecified: Secondary | ICD-10-CM

## 2020-11-08 DIAGNOSIS — E039 Hypothyroidism, unspecified: Secondary | ICD-10-CM

## 2020-11-08 DIAGNOSIS — K219 Gastro-esophageal reflux disease without esophagitis: Secondary | ICD-10-CM

## 2020-11-11 NOTE — Progress Notes (Signed)
Name: Debra Soto   MRN: [id number]    DOB: 07/19/1960   Date:11/12/2020       Progress Note  Subjective  Chief Complaint  Follow Up  HPI  Interstitial Lung Disease: seen by Dr. Brennan Bailey 03/2019 for SOB and abnormal CT chest 02/2019 , showed some granulomatous disease also a nodule, She states SOB only with moderate activity, she has chronic cough. She is not on any inhalers, she lost to follow up with pulmonologist , she is willing to go back but needs to be seen in [place]  She is due for repeat chest CT but I will place referral to pulmonologist    CT chest, 02/28/2019: 1.  Findings consistent with sequelae of a nonspecific granulomatous  disease including calcified mediastinal and hilar lymph nodes, calcified  lung granulomata, and splenic calcifications.  2.  Scattered groundglass opacities throughout both lungs with areas of  tree-in-bud nodularity could reflect multifocal infection including  atypical etiologies, although chronic interstitial lung disease including  sarcoidosis cannot be excluded.  3.  Nonspecific 3 mm solid pulmonary nodule in the right lower lobe.  4.  Moderate-sized hiatal hernia.   Repeat CT chest done 05/2019   Redemonstration of stigmata of chronic granulomatous disease including calcified mediastinal lymph nodes and calcified pulmonary nodules.  Previously noted groundglass opacities and micronodular disease has largely resolved on today's exam with only minimal residual groundglass micronodular disease noted involving the right lower lobe favored to represent a mild inflammatory/infectious process.  Scattered subcentimeter pulmonary nodules not significant changed relative to the prior exam, the largest measuring 8 x 4 mm involving the left lower lobe in which continued imaging surveillance should be considered with repeat CT examination in approximately one year. Normal PFT   Moderate mixed type hiatal hernia redemonstrated.     Positive ANA and  myalgia: seen Dr. Manuella Ghazi - Rheumatologist at [facility] , she was supposed to go back in 6 months, but she lost to follow up, she is now on Meloxicam but not on immuno-modulators. She is having more pain all over her body also on neck and back, sometimes she limps, joints swells at times. We will place another referral. She recently went to [facility] in sever back pain and radiculitis, doing better today - back to her baseline    Hypothyroidism: she is on 200 mcg synthroid, one pill daily , last TSH at goal but it was done one year ago, she has lost 21 lbs in the past 3 months. .   Denies change in bowel movements, skin is dry but stable.    Major Depression: she was has been taking Citalopram and Wellbutrin since 10/2016, she still has memory difficulties but stable - has mental fogginess from [place].  She has ADD and is taking medication as prescribed Phq 9 is still positive but stable.    FMS: she stopped Lyrica because it caused her to get moody. She states she is always is pain, she states pain comes and goes , she takes meloxicam prn. Unchanged   Chronic low back pain: going on for about 4 months, started without trauma, pain is constant on lower back, radiate across lower back. Pain is worse at night, has to shift her body due to FMS, but when she first gets up she feels very stiff and hard to walk. Pain can be 10/10, no radiculitis and recently went to [facility] for back pain with radicultitis but back to baseline. Denies bowel or bladder incontinence.    Obesity: BMI  is  now below 35,  joint aches and dyslipidemia. She has lost 21 lbs since last visit. She states no longer drinking 80 oz of Dr. Malachi Bonds per day, down to about 30 oz max. She states also does not feel very hungry, but she makes herself eat.   Headaches: still present , now daily, usually on nuchal area, and radiates to back of her headache. Sometimes the pain is frontal also and radiates to the nuchal area. She has a history of DDD cervical spine. She  states pain is described as aching like, intermittent, improves with otc medication. She is drinking less caffeine   Hyperlipidemia: she is trying to eat healthier  The 10-year ASCVD risk score Mikey Bussing DC Jr., et al., 2013) is: 4%   Values used to calculate the score:     Age: 60 years     Sex: Female     Is Non-Hispanic African American: No     Diabetic: No     Tobacco smoker: No     Systolic Blood Pressure: [id number] mmHg     Is BP treated: No     HDL Cholesterol: 53 mg/dL     Total Cholesterol: 223 mg/dL   Patient Active Problem List   Diagnosis Date Noted   Morbid obesity ([place]) 12/27/2018   Iron deficiency anemia secondary to inadequate dietary iron intake 11/06/2017   Special screening for malignant neoplasms, colon    Benign neoplasm of descending colon    Heartburn    Gastric polyp    Psoriasis 01/25/2016   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/22/2015   Endometriosis 12/31/2014   Insomnia, persistent 09/29/2014   IBS (irritable bowel syndrome) 09/29/2014   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/2014   Dyslipidemia 09/29/2014   Fibromyalgia syndrome 09/29/2014   Gastro-esophageal reflux disease without esophagitis 09/29/2014   Herpes labialis 09/29/2014   Adult hypothyroidism 09/29/2014   Family history of malignant neoplasm of breast 09/29/2014   Major depression, recurrent, chronic ([place]) 09/29/2014   Obstructive apnea 09/29/2014   Abnormal presence of protein in urine 09/29/2014   Allergic rhinitis 09/29/2014   Vitamin D deficiency 09/29/2014   Urge incontinence 09/29/2014   Headache, tension-type 09/29/2014   Menopausal symptom 09/29/2014    Past Surgical History:  Procedure Laterality Date   ABDOMINAL HYSTERECTOMY  [id number]   bladder tact     BREAST BIOPSY Right 09/02/2015   US Guided biopsy - Ribbon shaped marker - negative   CHOLECYSTECTOMY  [id number]   COLONOSCOPY     COLONOSCOPY WITH PROPOFOL N/A 02/10/2016   Procedure: COLONOSCOPY WITH PROPOFOL;  Surgeon: Lucilla Lame,  MD;  Location: [place];  Service: Endoscopy;  Laterality: N/A;   ESOPHAGOGASTRODUODENOSCOPY     ESOPHAGOGASTRODUODENOSCOPY (EGD) WITH PROPOFOL N/A 02/10/2016   Procedure: ESOPHAGOGASTRODUODENOSCOPY (EGD) WITH PROPOFOL;  Surgeon: Lucilla Lame, MD;  Location: [place];  Service: Endoscopy;  Laterality: N/A;  sleep apnea   LIPOMA EXCISION     POLYPECTOMY  02/10/2016   Procedure: POLYPECTOMY;  Surgeon: Lucilla Lame, MD;  Location: [place];  Service: Endoscopy;;   TONSILECTOMY, ADENOIDECTOMY, BILATERAL MYRINGOTOMY AND TUBES      Family History  Problem Relation Age of Onset   Hypertension Mother    Arthritis Mother    Cancer Mother        ovarian,breast, lung, liver   Breast cancer Mother 70   Ovarian cancer Mother 21   Lung cancer Mother 50   Colon cancer Brother    Other Father  lung problems   Ovarian cancer Maternal Grandmother        pt thinks is was ovarian but not sure   Heart disease Brother    Cancer Maternal Grandfather     Social History   Tobacco Use   Smoking status: Never   Smokeless tobacco: Never  Substance Use Topics   Alcohol use: Yes    Alcohol/week: 0.0 standard drinks    Comment: rarely     Current Outpatient Medications:    amphetamine-dextroamphetamine (ADDERALL XR) 15 MG 24 hr capsule, Take 1 capsule by mouth every morning., Disp: 30 capsule, Rfl: 0   amphetamine-dextroamphetamine (ADDERALL XR) 15 MG 24 hr capsule, Take 1 capsule by mouth every morning., Disp: 30 capsule, Rfl: 0   amphetamine-dextroamphetamine (ADDERALL XR) 15 MG 24 hr capsule, Take 1 capsule by mouth every morning., Disp: 30 capsule, Rfl: 0   baclofen (LIORESAL) 10 MG tablet, Take 1 tablet (10 mg total) by mouth at bedtime., Disp: 90 each, Rfl: 0   buPROPion (WELLBUTRIN XL) 150 MG 24 hr tablet, Every morning, Disp: 90 tablet, Rfl: 1   Ca Phosphate-Cholecalciferol 200-200 MG-UNIT CHEW, Chew 1 each by mouth daily., Disp: , Rfl:    citalopram  (CELEXA) 20 MG tablet, Daily, Disp: 90 tablet, Rfl: 1   levothyroxine (SYNTHROID) 200 MCG tablet, TAKE 1 TABLET BY MOUTH EVERY DAY, Disp: 90 tablet, Rfl: 0   Melatonin 5 MG CHEW, Chew 10 mg by mouth. , Disp: , Rfl:    meloxicam (MOBIC) 15 MG tablet, Take 1 tablet (15 mg total) by mouth daily as needed for pain., Disp: 90 tablet, Rfl: 0   omeprazole (PRILOSEC) 20 MG capsule, Twice daily, Disp: 180 capsule, Rfl: 1   phenylephrine (SUDAFED PE) 10 MG TABS tablet, Take by mouth., Disp: , Rfl:    Turmeric 500 MG CAPS, Take by mouth., Disp: , Rfl:    valACYclovir (VALTREX) 1000 MG tablet, TAKE 1 TABLET(1000 MG) BY MOUTH TWICE DAILY, Disp: 30 tablet, Rfl: 0  No Known Allergies  I personally reviewed active problem list, medication list, allergies, family history, social history, health maintenance with the patient/caregiver today.   ROS  Constitutional: Negative for fever , positive for weight change.  Respiratory: positive  for chronic cough some shortness of breath with moderate activity .   Cardiovascular: Negative for chest pain or palpitations.  Gastrointestinal: Negative for abdominal pain, no bowel changes.  Musculoskeletal: positive  for intermittent gait problem and  joint swelling.  Skin: Negative for rash.  Neurological: positive  for dizziness and headache intermittent .  No other specific complaints in a complete review of systems (except as listed in HPI above).   Objective  Vitals:   11/12/20 1309  BP: 134/82  Pulse: 98  Resp: 16  Temp: 98.1 F (36.7 C)  SpO2: 100%  Weight: 229 lb (103.9 kg)  Height: '5\' 8"'$  (1.727 m)    Body mass index is 34.82 kg/m.  Physical Exam  Constitutional: Patient appears well-developed and well-nourished. Obese  No distress.  HEENT: head atraumatic, normocephalic, pupils equal and reactive to light, neck supple, no thyromegaly  Cardiovascular: Normal rate, regular rhythm and normal heart sounds.  No murmur heard. No BLE  edema. Pulmonary/Chest: Effort normal and breath sounds normal. No respiratory distress. Abdominal: Soft.  There is no tenderness. Psychiatric: Patient has a normal mood and affect. behavior is normal. Judgment and thought content normal.  Muscular skeletal: trigger point positive   PHQ2/9: Depression screen [facility] 2/9 11/12/2020 08/10/2020 05/04/2020  01/14/2020 10/17/2019  Decreased Interest 0 0 0 1 0  Down, Depressed, Hopeless 1 0 0 1 0  PHQ - 2 Score 1 0 0 2 0  Altered sleeping '3 3 3 3 1  '$ Tired, decreased energy 3 - '2 2 3  '$ Change in appetite '3 3 3 2 '$ 0  Feeling bad or failure about yourself  0 0 0 0 0  Trouble concentrating 0 '3 1 1 3  '$ Moving slowly or fidgety/restless 0 0 0 0 0  Suicidal thoughts 0 0 0 0 0  PHQ-9 Score '10 9 9 10 7  '$ Difficult doing work/chores - - Not difficult at all Somewhat difficult Not difficult at all  Some recent data might be hidden    phq 9 is positive   Fall Risk: Fall Risk  11/12/2020 08/10/2020 05/04/2020 01/14/2020 10/17/2019  Falls in the past year? 0 0 '1 1 1  '$ Number falls in past yr: 0 0 1 1 0  Comment - - - - -  Injury with Fall? 0 0 0 0 0  Comment - - - - -  Risk for fall due to : No Fall Risks - History of fall(s) History of fall(s) Impaired balance/gait;History of fall(s)  Risk for fall due to: Comment - - - - -  Follow up Falls prevention discussed - Falls prevention discussed - -      Functional Status Survey: Is the patient deaf or have difficulty hearing?: Yes Does the patient have difficulty seeing, even when wearing glasses/contacts?: Yes Does the patient have difficulty concentrating, remembering, or making decisions?: No Does the patient have difficulty walking or climbing stairs?: No Does the patient have difficulty dressing or bathing?: No Does the patient have difficulty doing errands alone such as visiting a doctor's office or shopping?: No    Assessment & Plan  1. Adult hypothyroidism  - TSH  2. Interstitial lung disease  ([place])  Explained importance of seeing pulmonologist   3. OSA (obstructive sleep apnea)   4. Dyslipidemia  - Lipid panel  5. B12 deficiency  - CBC with Differential/Platelet  6. Vitamin D deficiency   7. Fibromyalgia syndrome  - amphetamine-dextroamphetamine (ADDERALL XR) 15 MG 24 hr capsule; Take 1 capsule by mouth every morning.  Dispense: 30 capsule; Refill: 0 - amphetamine-dextroamphetamine (ADDERALL XR) 15 MG 24 hr capsule; Take 1 capsule by mouth every morning.  Dispense: 30 capsule; Refill: 0 - amphetamine-dextroamphetamine (ADDERALL XR) 15 MG 24 hr capsule; Take 1 capsule by mouth every morning.  Dispense: 30 capsule; Refill: 0  8. Obesity (BMI 30.0-34.9)  Discussed with the patient the risk posed by an increased BMI. Discussed importance of portion control, calorie counting and at least 150 minutes of physical activity weekly. Avoid sweet beverages and drink more water. Eat at least 6 servings of fruit and vegetables daily    9. Gastro-esophageal reflux disease without esophagitis   10. Thrombocytosis   11. Chronic bilateral low back pain without sciatica   12. Primary insomnia   13. Tension headache   14. Major depression, recurrent, chronic (H[facility])  - amphetamine-dextroamphetamine (ADDERALL XR) 15 MG 24 hr capsule; Take 1 capsule by mouth every morning.  Dispense: 30 capsule; Refill: 0 - amphetamine-dextroamphetamine (ADDERALL XR) 15 MG 24 hr capsule; Take 1 capsule by mouth every morning.  Dispense: 30 capsule; Refill: 0 - amphetamine-dextroamphetamine (ADDERALL XR) 15 MG 24 hr capsule; Take 1 capsule by mouth every morning.  Dispense: 30 capsule; Refill: 0 - buPROPion (WELLBUTRIN XL) 150 MG  24 hr tablet; Every morning  Dispense: 90 tablet; Refill: 1 - citalopram (CELEXA) 20 MG tablet; Daily  Dispense: 90 tablet; Refill: 1 - Lipid panel  15. Need for vaccination for pneumococcus  - Pneumococcal conjugate vaccine 20-valent (Prevnar 20)  16. Needs flu  shot  - Flu Vaccine QUAD 6+ mos PF IM (Fluarix Quad PF)  17. Long-term use of high-risk medication  - COMPLETE METABOLIC PANEL WITH GFR   18. Arthralgia, unspecified joint   19. Positive ANA (antinuclear antibody)  - Ambulatory referral to Rheumatology - C-reactive protein - Sedimentation rate - ANA,IFA RA Diag Pnl w/rflx Tit/Patn

## 2020-11-12 ENCOUNTER — Ambulatory Visit (INDEPENDENT_AMBULATORY_CARE_PROVIDER_SITE_OTHER): Payer: 59 | Admitting: Family Medicine

## 2020-11-12 ENCOUNTER — Encounter: Payer: Self-pay | Admitting: Family Medicine

## 2020-11-12 ENCOUNTER — Other Ambulatory Visit: Payer: Self-pay

## 2020-11-12 VITALS — BP 134/82 | HR 98 | Temp 98.1°F | Resp 16 | Ht 68.0 in | Wt 229.0 lb

## 2020-11-12 DIAGNOSIS — E538 Deficiency of other specified B group vitamins: Secondary | ICD-10-CM

## 2020-11-12 DIAGNOSIS — K219 Gastro-esophageal reflux disease without esophagitis: Secondary | ICD-10-CM

## 2020-11-12 DIAGNOSIS — M545 Low back pain, unspecified: Secondary | ICD-10-CM

## 2020-11-12 DIAGNOSIS — M2550 Pain in unspecified joint: Secondary | ICD-10-CM

## 2020-11-12 DIAGNOSIS — E669 Obesity, unspecified: Secondary | ICD-10-CM

## 2020-11-12 DIAGNOSIS — F339 Major depressive disorder, recurrent, unspecified: Secondary | ICD-10-CM

## 2020-11-12 DIAGNOSIS — R768 Other specified abnormal immunological findings in serum: Secondary | ICD-10-CM

## 2020-11-12 DIAGNOSIS — M797 Fibromyalgia: Secondary | ICD-10-CM

## 2020-11-12 DIAGNOSIS — E559 Vitamin D deficiency, unspecified: Secondary | ICD-10-CM

## 2020-11-12 DIAGNOSIS — G44209 Tension-type headache, unspecified, not intractable: Secondary | ICD-10-CM

## 2020-11-12 DIAGNOSIS — G4733 Obstructive sleep apnea (adult) (pediatric): Secondary | ICD-10-CM

## 2020-11-12 DIAGNOSIS — E039 Hypothyroidism, unspecified: Secondary | ICD-10-CM | POA: Diagnosis not present

## 2020-11-12 DIAGNOSIS — D75839 Thrombocytosis, unspecified: Secondary | ICD-10-CM

## 2020-11-12 DIAGNOSIS — E785 Hyperlipidemia, unspecified: Secondary | ICD-10-CM | POA: Diagnosis not present

## 2020-11-12 DIAGNOSIS — Z23 Encounter for immunization: Secondary | ICD-10-CM

## 2020-11-12 DIAGNOSIS — Z79899 Other long term (current) drug therapy: Secondary | ICD-10-CM

## 2020-11-12 DIAGNOSIS — G8929 Other chronic pain: Secondary | ICD-10-CM

## 2020-11-12 DIAGNOSIS — J849 Interstitial pulmonary disease, unspecified: Secondary | ICD-10-CM

## 2020-11-12 DIAGNOSIS — F5101 Primary insomnia: Secondary | ICD-10-CM

## 2020-11-12 MED ORDER — AMPHETAMINE-DEXTROAMPHET ER 15 MG PO CP24: 15.0000 mg | ORAL_CAPSULE | ORAL | 0 refills | Status: DC

## 2020-11-12 MED ORDER — BUPROPION HCL ER (XL) 150 MG PO TB24: ORAL_TABLET | ORAL | 1 refills | Status: DC

## 2020-11-12 MED ORDER — CITALOPRAM HYDROBROMIDE 20 MG PO TABS: ORAL_TABLET | ORAL | 1 refills | Status: DC

## 2020-11-17 ENCOUNTER — Other Ambulatory Visit: Payer: Self-pay | Admitting: Family Medicine

## 2020-11-17 DIAGNOSIS — R768 Other specified abnormal immunological findings in serum: Secondary | ICD-10-CM

## 2020-11-17 DIAGNOSIS — M2550 Pain in unspecified joint: Secondary | ICD-10-CM

## 2020-11-17 LAB — CBC WITH DIFFERENTIAL/PLATELET
Absolute Monocytes: 693 cells/uL (ref 200–950)
Basophils Absolute: 79 cells/uL (ref 0–200)
Basophils Relative: 0.8 %
Eosinophils Absolute: 20 cells/uL (ref 15–500)
Eosinophils Relative: 0.2 %
HCT: 30.4 % — ABNORMAL LOW (ref 35.0–45.0)
Hemoglobin: 8.5 g/dL — ABNORMAL LOW (ref 11.7–15.5)
Lymphs Abs: 2237 cells/uL (ref 850–3900)
MCH: 18.7 pg — ABNORMAL LOW (ref 27.0–33.0)
MCHC: 28 g/dL — ABNORMAL LOW (ref 32.0–36.0)
MCV: 67 fL — ABNORMAL LOW (ref 80.0–100.0)
MPV: 10.2 fL (ref 7.5–12.5)
Monocytes Relative: 7 %
Neutro Abs: 6871 cells/uL (ref 1500–7800)
Neutrophils Relative %: 69.4 %
Platelets: 557 10*3/uL — ABNORMAL HIGH (ref 140–400)
RBC: 4.54 10*6/uL (ref 3.80–5.10)
RDW: 16.9 % — ABNORMAL HIGH (ref 11.0–15.0)
Total Lymphocyte: 22.6 %
WBC: 9.9 10*3/uL (ref 3.8–10.8)

## 2020-11-17 LAB — C-REACTIVE PROTEIN: CRP: 9.7 mg/L — ABNORMAL HIGH (ref <8.0)

## 2020-11-17 LAB — SEDIMENTATION RATE: Sed Rate: 46 mm/h — ABNORMAL HIGH (ref 0–30)

## 2020-11-17 LAB — LIPID PANEL
Cholesterol: 182 mg/dL (ref <200)
HDL: 48 mg/dL — ABNORMAL LOW (ref >=50)
LDL Cholesterol (Calc): 112 mg/dL (calc) — ABNORMAL HIGH
Non-HDL Cholesterol (Calc): 134 mg/dL (calc) — ABNORMAL HIGH (ref <130)
Total CHOL/HDL Ratio: 3.8 (calc) (ref <5.0)
Triglycerides: 113 mg/dL (ref <150)

## 2020-11-17 LAB — COMPLETE METABOLIC PANEL WITH GFR
AG Ratio: 1.1 (calc) (ref 1.0–2.5)
ALT: 14 U/L (ref 6–29)
AST: 14 U/L (ref 10–35)
Albumin: 3.9 g/dL (ref 3.6–5.1)
Alkaline phosphatase (APISO): 113 U/L (ref 37–153)
BUN: 13 mg/dL (ref 7–25)
CO2: 29 mmol/L (ref 20–32)
Calcium: 9.6 mg/dL (ref 8.6–10.4)
Chloride: 103 mmol/L (ref 98–110)
Creat: 0.93 mg/dL (ref 0.50–1.05)
Globulin: 3.4 g/dL (calc) (ref 1.9–3.7)
Glucose, Bld: 79 mg/dL (ref 65–99)
Potassium: 4.4 mmol/L (ref 3.5–5.3)
Sodium: 137 mmol/L (ref 135–146)
Total Bilirubin: 0.3 mg/dL (ref 0.2–1.2)
Total Protein: 7.3 g/dL (ref 6.1–8.1)
eGFR: 70 mL/min/{1.73_m2} (ref >=60)

## 2020-11-17 LAB — ANA,IFA RA DIAG PNL W/RFLX TIT/PATN
Anti Nuclear Antibody (ANA): NEGATIVE
Cyclic Citrullin Peptide Ab: <16 UNITS
Rheumatoid fact SerPl-aCnc: <14 IU/mL (ref <14)

## 2020-11-17 LAB — CBC MORPHOLOGY

## 2020-11-17 LAB — TSH: TSH: 0.02 mIU/L — ABNORMAL LOW (ref 0.40–4.50)

## 2020-11-18 NOTE — Telephone Encounter (Signed)
Requested Prescriptions  Pending Prescriptions Disp Refills  . meloxicam (MOBIC) 15 MG tablet [Pharmacy Med Name: MELOXICAM '15MG'$  TABLETS] 90 tablet 0    Sig: TAKE 1 TABLET(15 MG) BY MOUTH DAILY AS NEEDED FOR PAIN     Analgesics:  COX2 Inhibitors Failed - 11/17/2020  5:46 PM      Failed - HGB in normal range and within 360 days    Hemoglobin  Date Value Ref Range Status  11/12/2020 8.5 (L) 11.7 - 15.5 g/dL Final  01/02/2018 12.7 12.0 - 15.0 g/dL Final  07/22/2015 11.3 11.1 - 15.9 g/dL Final         Passed - Cr in normal range and within 360 days    Creat  Date Value Ref Range Status  11/12/2020 0.93 0.50 - 1.05 mg/dL Final         Passed - Patient is not pregnant      Passed - Valid encounter within last 12 months    Recent Outpatient Visits          6 days ago Adult hypothyroidism   [facility] Steele Sizer, MD   3 months ago Morbid obesity [facility])   [facility] Steele Sizer, MD   6 months ago Morbid obesity [facility])   [facility] Steele Sizer, MD   10 months ago Major depression, recurrent, chronic [facility])   [facility] Steele Sizer, MD   1 year ago Adult hypothyroidism   [facility] Steele Sizer, MD      Future Appointments            In 2 months Ancil Boozer, Drue Stager, MD [facility], [facility]

## 2020-11-20 ENCOUNTER — Other Ambulatory Visit: Payer: Self-pay | Admitting: Family Medicine

## 2020-11-20 DIAGNOSIS — E039 Hypothyroidism, unspecified: Secondary | ICD-10-CM

## 2020-11-20 NOTE — Telephone Encounter (Signed)
Requested medication (s) are due for refill today: yes  Requested medication (s) are on the active medication list: yes  Last refill:  08/10/20 #90  Future visit scheduled: yes  Notes to clinic:  11/12/20 abnormal lab due to repeat in December-- Sending back was hesitant to refill due to last TSH   Requested Prescriptions  Pending Prescriptions Disp Refills   levothyroxine (SYNTHROID) 200 MCG tablet [Pharmacy Med Name: LEVOTHYROXINE 0.'2MG'$  (200MCG) TAB] 90 tablet 0    Sig: TAKE 1 TABLET BY MOUTH EVERY DAY     Endocrinology:  Hypothyroid Agents Failed - 11/20/2020  3:04 PM      Failed - TSH needs to be rechecked within 3 months after an abnormal result. Refill until TSH is due.      Failed - TSH in normal range and within 360 days    TSH  Date Value Ref Range Status  11/12/2020 0.02 (L) 0.40 - 4.50 mIU/L Final          Passed - Valid encounter within last 12 months    Recent Outpatient Visits           1 week ago Adult hypothyroidism   [facility] Steele Sizer, MD   3 months ago Morbid obesity [facility])   [facility] Steele Sizer, MD   6 months ago Morbid obesity [facility])   [facility] Steele Sizer, MD   10 months ago Major depression, recurrent, chronic [facility])   [facility] Steele Sizer, MD   1 year ago Adult hypothyroidism   [facility] Steele Sizer, MD       Future Appointments             In 2 months Ancil Boozer, Drue Stager, MD [facility], [facility]

## 2020-11-22 ENCOUNTER — Other Ambulatory Visit: Payer: Self-pay | Admitting: Family Medicine

## 2020-11-22 ENCOUNTER — Other Ambulatory Visit: Payer: Self-pay

## 2020-11-22 DIAGNOSIS — E039 Hypothyroidism, unspecified: Secondary | ICD-10-CM

## 2020-11-22 MED ORDER — LEVOTHYROXINE SODIUM 200 MCG PO TABS: 100.0000 ug | ORAL_TABLET | Freq: Every day | ORAL | 0 refills | Status: DC

## 2020-11-22 NOTE — Telephone Encounter (Signed)
Last seen 9.2.2022 next appt 12.5.2022

## 2020-11-23 NOTE — Telephone Encounter (Signed)
Already completed by Dr.Sowles

## 2020-12-02 ENCOUNTER — Other Ambulatory Visit: Payer: Self-pay

## 2020-12-02 ENCOUNTER — Other Ambulatory Visit: Payer: Self-pay | Admitting: Family Medicine

## 2020-12-02 DIAGNOSIS — E039 Hypothyroidism, unspecified: Secondary | ICD-10-CM

## 2020-12-02 DIAGNOSIS — D649 Anemia, unspecified: Secondary | ICD-10-CM

## 2021-01-14 ENCOUNTER — Other Ambulatory Visit: Payer: Self-pay | Admitting: Family Medicine

## 2021-01-14 DIAGNOSIS — K219 Gastro-esophageal reflux disease without esophagitis: Secondary | ICD-10-CM

## 2021-01-18 ENCOUNTER — Other Ambulatory Visit: Payer: Self-pay

## 2021-01-18 DIAGNOSIS — K219 Gastro-esophageal reflux disease without esophagitis: Secondary | ICD-10-CM

## 2021-02-11 NOTE — Progress Notes (Signed)
Name: Debra Soto   MRN: [id number]    DOB: 03/08/1961   Date:02/14/2021       Progress Note  Subjective  Chief Complaint  Follow Up  HPI  Interstitial Lung Disease: seen by Dr. Brennan Bailey 03/2019 for SOB and abnormal CT chest 02/2019 , showed some granulomatous disease also a nodule, She states SOB only with moderate activity, she has chronic cough. She is not on any inhalers, she lost to follow up with pulmonologist . We placed a referral on her last visit and she states missed a visit but has a follow up tomorrow.   CT chest, 02/28/2019: 1.  Findings consistent with sequelae of a nonspecific granulomatous  disease including calcified mediastinal and hilar lymph nodes, calcified  lung granulomata, and splenic calcifications.  2.  Scattered groundglass opacities throughout both lungs with areas of  tree-in-bud nodularity could reflect multifocal infection including  atypical etiologies, although chronic interstitial lung disease including  sarcoidosis cannot be excluded.  3.  Nonspecific 3 mm solid pulmonary nodule in the right lower lobe.  4.  Moderate-sized hiatal hernia.   Repeat CT chest done 05/2019   Redemonstration of stigmata of chronic granulomatous disease including calcified mediastinal lymph nodes and calcified pulmonary nodules.  Previously noted groundglass opacities and micronodular disease has largely resolved on today's exam with only minimal residual groundglass micronodular disease noted involving the right lower lobe favored to represent a mild inflammatory/infectious process.  Scattered subcentimeter pulmonary nodules not significant changed relative to the prior exam, the largest measuring 8 x 4 mm involving the left lower lobe in which continued imaging surveillance should be considered with repeat CT examination in approximately one year. Normal PFT   Moderate mixed type hiatal hernia redemonstrated.     Positive ANA and myalgia: seen Dr. Manuella Ghazi -  Rheumatologist at [facility] , she was supposed to go back in 6 months, but she lost to follow up, she is now on Meloxicam but not on immuno-modulators. She continues to have pain all over, last CRP was still elevated but significantly better. She burned her skin with a heating pad   Hypothyroidism: she is on 200 mcg synthroid one daily but last visit TSH was suppressed and currently skipping Saturday and Sundays. She is still feeling tired, she gained only a couple of pounds. .   Denies change in bowel movements, skin is dry but stable.    Major Depression: she was has been taking Citalopram and Wellbutrin since 10/2016, she still has memory difficulties but stable - has mental fogginess from [place].  She has ADD also. Phq is stable and needs refills of medications   FMS: she stopped Lyrica because it caused her to get moody. She states she is always is pain, she states pain comes and goes , she takes meloxicam prn. Stable.   Chronic low back pain: going on for about 7 months, started without trauma, pain is constant on lower back, radiate across lower back. Pain is worse at night, using heating pad all the time .    Obesity: BMI is  now below 35,  joint aches and dyslipidemia. She had lost 21 lbs before her last visit  She has been drinking more Dr. Malachi Bonds again , trying to drink more water   Headaches: still present , now daily, usually on nuchal area, and radiates to back of her headache. Sometimes the pain is frontal also and radiates to the nuchal area. She has a history of DDD cervical spine. Lately also has  sinus pressure and her head feels full   Hyperlipidemia: she is trying to eat healthier, reviewed last labs with patient   Patient Active Problem List   Diagnosis Date Noted   Morbid obesity ([place]) 12/27/2018   Iron deficiency anemia secondary to inadequate dietary iron intake 11/06/2017   Benign neoplasm of descending colon    Heartburn    Gastric polyp    Psoriasis 01/25/2016    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/22/2015   Endometriosis 12/31/2014   Insomnia, persistent 09/29/2014   IBS (irritable bowel syndrome) 09/29/2014   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/2014   Dyslipidemia 09/29/2014   Fibromyalgia syndrome 09/29/2014   Gastro-esophageal reflux disease without esophagitis 09/29/2014   Herpes labialis 09/29/2014   Adult hypothyroidism 09/29/2014   Family history of malignant neoplasm of breast 09/29/2014   Major depression, recurrent, chronic ([place]) 09/29/2014   Obstructive apnea 09/29/2014   Abnormal presence of protein in urine 09/29/2014   Allergic rhinitis 09/29/2014   Vitamin D deficiency 09/29/2014   Urge incontinence 09/29/2014   Headache, tension-type 09/29/2014   Menopausal symptom 09/29/2014    Past Surgical History:  Procedure Laterality Date   ABDOMINAL HYSTERECTOMY  [id number]   bladder tact     BREAST BIOPSY Right 09/02/2015   US Guided biopsy - Ribbon shaped marker - negative   CHOLECYSTECTOMY  [id number]   COLONOSCOPY     COLONOSCOPY WITH PROPOFOL N/A 02/10/2016   Procedure: COLONOSCOPY WITH PROPOFOL;  Surgeon: Lucilla Lame, MD;  Location: [place];  Service: Endoscopy;  Laterality: N/A;   ESOPHAGOGASTRODUODENOSCOPY     ESOPHAGOGASTRODUODENOSCOPY (EGD) WITH PROPOFOL N/A 02/10/2016   Procedure: ESOPHAGOGASTRODUODENOSCOPY (EGD) WITH PROPOFOL;  Surgeon: Lucilla Lame, MD;  Location: [place];  Service: Endoscopy;  Laterality: N/A;  sleep apnea   LIPOMA EXCISION     POLYPECTOMY  02/10/2016   Procedure: POLYPECTOMY;  Surgeon: Lucilla Lame, MD;  Location: [place];  Service: Endoscopy;;   TONSILECTOMY, ADENOIDECTOMY, BILATERAL MYRINGOTOMY AND TUBES      Family History  Problem Relation Age of Onset   Hypertension Mother    Arthritis Mother    Cancer Mother        ovarian,breast, lung, liver   Breast cancer Mother 80   Ovarian cancer Mother 30   Lung cancer Mother 88   Colon cancer Brother    Other Father         lung problems   Ovarian cancer Maternal Grandmother        pt thinks is was ovarian but not sure   Heart disease Brother    Cancer Maternal Grandfather     Social History   Tobacco Use   Smoking status: Never   Smokeless tobacco: Never  Substance Use Topics   Alcohol use: Yes    Alcohol/week: 0.0 standard drinks    Comment: rarely     Current Outpatient Medications:    amphetamine-dextroamphetamine (ADDERALL XR) 15 MG 24 hr capsule, Take 1 capsule by mouth every morning., Disp: 30 capsule, Rfl: 0   amphetamine-dextroamphetamine (ADDERALL XR) 15 MG 24 hr capsule, Take 1 capsule by mouth every morning., Disp: 30 capsule, Rfl: 0   amphetamine-dextroamphetamine (ADDERALL XR) 15 MG 24 hr capsule, Take 1 capsule by mouth every morning., Disp: 30 capsule, Rfl: 0   baclofen (LIORESAL) 10 MG tablet, Take 1 tablet (10 mg total) by mouth at bedtime., Disp: 90 each, Rfl: 0   buPROPion (WELLBUTRIN XL) 150 MG 24 hr tablet, Every morning, Disp: 90 tablet, Rfl: 1  Ca Phosphate-Cholecalciferol 200-200 MG-UNIT CHEW, Chew 1 each by mouth daily., Disp: , Rfl:    citalopram (CELEXA) 20 MG tablet, Daily, Disp: 90 tablet, Rfl: 1   levothyroxine (SYNTHROID) 200 MCG tablet, Take 0.5-1 tablets (100-200 mcg total) by mouth daily. But only half on Sundays, recheck in 6 weeks, Disp: 90 tablet, Rfl: 0   Melatonin 5 MG CHEW, Chew 10 mg by mouth. , Disp: , Rfl:    meloxicam (MOBIC) 15 MG tablet, TAKE 1 TABLET(15 MG) BY MOUTH DAILY AS NEEDED FOR PAIN, Disp: 90 tablet, Rfl: 0   omeprazole (PRILOSEC) 20 MG capsule, TAKE 1 CAPSULE BY MOUTH TWICE DAILY, Disp: 180 capsule, Rfl: 1   phenylephrine (SUDAFED PE) 10 MG TABS tablet, Take by mouth., Disp: , Rfl:    Turmeric 500 MG CAPS, Take by mouth., Disp: , Rfl:    valACYclovir (VALTREX) 1000 MG tablet, TAKE 1 TABLET(1000 MG) BY MOUTH TWICE DAILY, Disp: 30 tablet, Rfl: 0  No Known Allergies  I personally reviewed active problem list, medication list, allergies,  family history, social history, health maintenance with the patient/caregiver today.   ROS  Constitutional: Negative for fever or weight change.  Respiratory: Negative for cough . Positive for shortness of breath.   Cardiovascular: Negative for chest pain or palpitations.  Gastrointestinal: Negative for abdominal pain, no bowel changes.  Musculoskeletal: Negative for gait problem or joint swelling.  Skin: Negative for rash.  Neurological: Negative for dizziness , positive for  headache.  No other specific complaints in a complete review of systems (except as listed in HPI above).   Objective  Vitals:   02/14/21 1441  BP: 118/70  Pulse: 79  Resp: 16  Temp: 98.1 F (36.7 C)  SpO2: 98%  Weight: 231 lb (104.8 kg)  Height: 5\' 8"  (1.727 m)    Body mass index is 35.12 kg/m.  Physical Exam  Constitutional: Patient appears well-developed and well-nourished. Obese   looks pale   HEENT: head atraumatic, normocephalic, pupils equal and reactive to light,  neck supple Cardiovascular: Normal rate, regular rhythm and normal heart sounds.  No murmur heard. No BLE edema. Pulmonary/Chest: Effort normal and breath sounds normal. No respiratory distress. Abdominal: Soft.  There is no tenderness. Muscular Skeletal: no synovitis  Psychiatric: Patient has a normal mood and affect. behavior is normal. Judgment and thought content normal.    PHQ2/9: Depression screen [facility] 2/9 02/14/2021 11/12/2020 08/10/2020 05/04/2020 01/14/2020  Decreased Interest 1 0 0 0 1  Down, Depressed, Hopeless 1 1 0 0 1  PHQ - 2 Score 2 1 0 0 2  Altered sleeping 3 3 3 3 3   Tired, decreased energy 3 3 - 2 2  Change in appetite 1 3 3 3 2   Feeling bad or failure about yourself  0 0 0 0 0  Trouble concentrating 0 0 3 1 1   Moving slowly or fidgety/restless 0 0 0 0 0  Suicidal thoughts 0 0 0 0 0  PHQ-9 Score 9 10 9 9 10   Difficult doing work/chores - - - Not difficult at all Somewhat difficult  Some recent data might be  hidden    phq 9 is positive   Fall Risk: Fall Risk  02/14/2021 11/12/2020 08/10/2020 05/04/2020 01/14/2020  Falls in the past year? 0 0 0 1 1  Number falls in past yr: 0 0 0 1 1  Comment - - - - -  Injury with Fall? 0 0 0 0 0  Comment - - - - -  Risk for fall due to : No Fall Risks No Fall Risks - History of fall(s) History of fall(s)  Risk for fall due to: Comment - - - - -  Follow up Falls prevention discussed Falls prevention discussed - Falls prevention discussed -      Functional Status Survey: Is the patient deaf or have difficulty hearing?: Yes Does the patient have difficulty seeing, even when wearing glasses/contacts?: Yes Does the patient have difficulty concentrating, remembering, or making decisions?: Yes Does the patient have difficulty walking or climbing stairs?: No Does the patient have difficulty dressing or bathing?: No Does the patient have difficulty doing errands alone such as visiting a doctor's office or shopping?: No    Assessment & Plan  1. Interstitial lung disease ([place])  Keep appointment with pulmonologist   2. Morbid obesity ([place])  Discussed with the patient the risk posed by an increased BMI. Discussed importance of portion control, calorie counting and at least 150 minutes of physical activity weekly. Avoid sweet beverages and drink more water. Eat at least 6 servings of fruit and vegetables daily    3. Adult hypothyroidism  - TSH  4. Major depression, recurrent, chronic ([place])   5. B12 deficiency  - Ambulatory referral to Hematology / Oncology - Vitamin B12  6. Iron deficiency anemia secondary to inadequate dietary iron intake  - Ambulatory referral to Hematology / Oncology - Iron, TIBC and Ferritin Panel - CBC with Differential/Platelet  7. Dyslipidemia   8. OSA (obstructive sleep apnea)   9. Vitamin D deficiency   10. Gastro-esophageal reflux disease without esophagitis   11. Thrombocytosis  - Ambulatory referral to  Hematology / Oncology - CBC with Differential/Platelet

## 2021-02-14 ENCOUNTER — Encounter: Payer: Self-pay | Admitting: Family Medicine

## 2021-02-14 ENCOUNTER — Ambulatory Visit (INDEPENDENT_AMBULATORY_CARE_PROVIDER_SITE_OTHER): Payer: 59 | Admitting: Family Medicine

## 2021-02-14 VITALS — BP 118/70 | HR 79 | Temp 98.1°F | Resp 16 | Ht 68.0 in | Wt 231.0 lb

## 2021-02-14 DIAGNOSIS — K219 Gastro-esophageal reflux disease without esophagitis: Secondary | ICD-10-CM

## 2021-02-14 DIAGNOSIS — J849 Interstitial pulmonary disease, unspecified: Secondary | ICD-10-CM | POA: Diagnosis not present

## 2021-02-14 DIAGNOSIS — D75839 Thrombocytosis, unspecified: Secondary | ICD-10-CM

## 2021-02-14 DIAGNOSIS — F339 Major depressive disorder, recurrent, unspecified: Secondary | ICD-10-CM

## 2021-02-14 DIAGNOSIS — D508 Other iron deficiency anemias: Secondary | ICD-10-CM

## 2021-02-14 DIAGNOSIS — E785 Hyperlipidemia, unspecified: Secondary | ICD-10-CM

## 2021-02-14 DIAGNOSIS — E039 Hypothyroidism, unspecified: Secondary | ICD-10-CM | POA: Diagnosis not present

## 2021-02-14 DIAGNOSIS — G4733 Obstructive sleep apnea (adult) (pediatric): Secondary | ICD-10-CM

## 2021-02-14 DIAGNOSIS — E559 Vitamin D deficiency, unspecified: Secondary | ICD-10-CM

## 2021-02-14 DIAGNOSIS — E538 Deficiency of other specified B group vitamins: Secondary | ICD-10-CM

## 2021-02-14 NOTE — Patient Instructions (Addendum)
Referral has been sent to [place]: [phone]   F: [phone]         Referral has been sent to [place]: [phone] F: [phone]

## 2021-02-15 ENCOUNTER — Telehealth: Payer: Self-pay | Admitting: Family Medicine

## 2021-02-15 ENCOUNTER — Encounter: Payer: Self-pay | Admitting: Family Medicine

## 2021-02-15 LAB — CBC WITH DIFFERENTIAL/PLATELET
Absolute Monocytes: 374 cells/uL (ref 200–950)
Basophils Absolute: 72 cells/uL (ref 0–200)
Basophils Relative: 1 %
Eosinophils Absolute: 43 cells/uL (ref 15–500)
Eosinophils Relative: 0.6 %
HCT: 24.3 % — ABNORMAL LOW (ref 35.0–45.0)
Hemoglobin: 6.7 g/dL — ABNORMAL LOW (ref 11.7–15.5)
Lymphs Abs: 2664 cells/uL (ref 850–3900)
MCH: 17.8 pg — ABNORMAL LOW (ref 27.0–33.0)
MCHC: 27.6 g/dL — ABNORMAL LOW (ref 32.0–36.0)
MCV: 64.5 fL — ABNORMAL LOW (ref 80.0–100.0)
MPV: 10.4 fL (ref 7.5–12.5)
Monocytes Relative: 5.2 %
Neutro Abs: 4046 cells/uL (ref 1500–7800)
Neutrophils Relative %: 56.2 %
Platelets: 463 10*3/uL — ABNORMAL HIGH (ref 140–400)
RBC: 3.77 10*6/uL — ABNORMAL LOW (ref 3.80–5.10)
RDW: 17.6 % — ABNORMAL HIGH (ref 11.0–15.0)
Total Lymphocyte: 37 %
WBC: 7.2 10*3/uL (ref 3.8–10.8)

## 2021-02-15 LAB — IRON,TIBC AND FERRITIN PANEL
%SAT: 4 % (calc) — ABNORMAL LOW (ref 16–45)
Ferritin: 2 ng/mL — ABNORMAL LOW (ref 16–232)
Iron: 15 ug/dL — ABNORMAL LOW (ref 45–160)
TIBC: 412 mcg/dL (calc) (ref 250–450)

## 2021-02-15 LAB — VITAMIN B12: Vitamin B-12: 318 pg/mL (ref 200–1100)

## 2021-02-15 LAB — TSH: TSH: 5.76 mIU/L — ABNORMAL HIGH (ref 0.40–4.50)

## 2021-02-15 NOTE — Telephone Encounter (Signed)
Patient returned call about getting appt within the next 2 days that Dr Ancil Boozer wanted for her because she didn't like the results of her labs. Please call back

## 2021-02-16 NOTE — Progress Notes (Signed)
Name: Debra Soto   MRN: [id number]    DOB: [birth year]   Date:02/17/2021       Progress Note  Subjective  Chief Complaint  Discuss Labs  HPI  Iron deficiency anemia: she was seen three days ago, she was feeling tired and was place during exam. She came in today to discuss results. We have referred her to hematologist but she does not have an appointment yet. Explained that we can recheck labs stat and if any drop she will need to go to [facility]. She feels very fatigued, no SOB, denies blood in stools. Vision feels fuzzy. She told me today she has nose bleeds from left nostril only, she states last week it was happening daily , but would stop quickly. No episodes this  week.   Thrombocytosis: likely reactive  B12 deficiency: advised to take SL B12 1000 mcg daily   Hypothyroidism: TSH slightly above goal , but we will get her to see hematologist first before adjusting thyroid medication, return in one month for repeat TSH   Patient Active Problem List   Diagnosis Date Noted   Morbid obesity ([place]) 12/27/2018   Iron deficiency anemia secondary to inadequate dietary iron intake 11/06/2017   Benign neoplasm of descending colon    Heartburn    Gastric polyp    Psoriasis 01/25/2016   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/22/2015   Endometriosis 12/31/2014   Insomnia, persistent 09/29/2014   IBS (irritable bowel syndrome) 09/29/2014   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/2014   Dyslipidemia 09/29/2014   Fibromyalgia syndrome 09/29/2014   Gastro-esophageal reflux disease without esophagitis 09/29/2014   Herpes labialis 09/29/2014   Adult hypothyroidism 09/29/2014   Family history of malignant neoplasm of breast 09/29/2014   Major depression, recurrent, chronic ([place]) 09/29/2014   Obstructive apnea 09/29/2014   Abnormal presence of protein in urine 09/29/2014   Allergic rhinitis 09/29/2014   Vitamin D deficiency 09/29/2014   Urge incontinence 09/29/2014   Headache, tension-type 09/29/2014   Menopausal  symptom 09/29/2014    Past Surgical History:  Procedure Laterality Date   ABDOMINAL HYSTERECTOMY  [id number]   bladder tact     BREAST BIOPSY Right 09/02/2015   US Guided biopsy - Ribbon shaped marker - negative   CHOLECYSTECTOMY  [id number]   COLONOSCOPY     COLONOSCOPY WITH PROPOFOL N/A 02/10/2016   Procedure: COLONOSCOPY WITH PROPOFOL;  Surgeon: Lucilla Lame, MD;  Location: [place];  Service: Endoscopy;  Laterality: N/A;   ESOPHAGOGASTRODUODENOSCOPY     ESOPHAGOGASTRODUODENOSCOPY (EGD) WITH PROPOFOL N/A 02/10/2016   Procedure: ESOPHAGOGASTRODUODENOSCOPY (EGD) WITH PROPOFOL;  Surgeon: Lucilla Lame, MD;  Location: [place];  Service: Endoscopy;  Laterality: N/A;  sleep apnea   LIPOMA EXCISION     POLYPECTOMY  02/10/2016   Procedure: POLYPECTOMY;  Surgeon: Lucilla Lame, MD;  Location: [place];  Service: Endoscopy;;   TONSILECTOMY, ADENOIDECTOMY, BILATERAL MYRINGOTOMY AND TUBES      Family History  Problem Relation Age of Onset   Hypertension Mother    Arthritis Mother    Cancer Mother        ovarian,breast, lung, liver   Breast cancer Mother 62   Ovarian cancer Mother 48   Lung cancer Mother 33   Colon cancer Brother    Other Father        lung problems   Ovarian cancer Maternal Grandmother        pt thinks is was ovarian but not sure   Heart disease Brother    Cancer Maternal  Grandfather     Social History   Tobacco Use   Smoking status: Never   Smokeless tobacco: Never  Substance Use Topics   Alcohol use: Yes    Alcohol/week: 0.0 standard drinks    Comment: rarely     Current Outpatient Medications:    amphetamine-dextroamphetamine (ADDERALL XR) 15 MG 24 hr capsule, Take 1 capsule by mouth every morning., Disp: 30 capsule, Rfl: 0   amphetamine-dextroamphetamine (ADDERALL XR) 15 MG 24 hr capsule, Take 1 capsule by mouth every morning., Disp: 30 capsule, Rfl: 0   amphetamine-dextroamphetamine (ADDERALL XR) 15 MG 24 hr capsule, Take 1  capsule by mouth every morning., Disp: 30 capsule, Rfl: 0   baclofen (LIORESAL) 10 MG tablet, Take 1 tablet (10 mg total) by mouth at bedtime., Disp: 90 each, Rfl: 0   buPROPion (WELLBUTRIN XL) 150 MG 24 hr tablet, Every morning, Disp: 90 tablet, Rfl: 1   Ca Phosphate-Cholecalciferol 200-200 MG-UNIT CHEW, Chew 1 each by mouth daily., Disp: , Rfl:    citalopram (CELEXA) 20 MG tablet, Daily, Disp: 90 tablet, Rfl: 1   levothyroxine (SYNTHROID) 200 MCG tablet, Take 0.5-1 tablets (100-200 mcg total) by mouth daily. But only half on Sundays, recheck in 6 weeks, Disp: 90 tablet, Rfl: 0   Melatonin 5 MG CHEW, Chew 10 mg by mouth. , Disp: , Rfl:    meloxicam (MOBIC) 15 MG tablet, TAKE 1 TABLET(15 MG) BY MOUTH DAILY AS NEEDED FOR PAIN, Disp: 90 tablet, Rfl: 0   omeprazole (PRILOSEC) 20 MG capsule, TAKE 1 CAPSULE BY MOUTH TWICE DAILY, Disp: 180 capsule, Rfl: 1   phenylephrine (SUDAFED PE) 10 MG TABS tablet, Take by mouth., Disp: , Rfl:    Turmeric 500 MG CAPS, Take by mouth., Disp: , Rfl:    valACYclovir (VALTREX) 1000 MG tablet, TAKE 1 TABLET(1000 MG) BY MOUTH TWICE DAILY, Disp: 30 tablet, Rfl: 0  No Known Allergies  I personally reviewed active problem list, medication list, allergies, family history, social history, health maintenance with the patient/caregiver today.   ROS  Ten systems reviewed and is negative except as mentioned in HPI   Objective  Vitals:   02/17/21 0739  BP: 120/74  Pulse: 87  Resp: 16  Temp: 98 F (36.7 C)  SpO2: 99%  Weight: 231 lb (104.8 kg)  Height: 5\' 8"  (1.727 m)    Body mass index is 35.12 kg/m.  Physical Exam  Constitutional: Patient appears well-developed Obese  No distress. Looks pale  HEENT: head atraumatic, normocephalic, pupils equal and reactive to light, neck supple Cardiovascular: Normal rate, regular rhythm and normal heart sounds.  No murmur heard. No BLE edema. Pulmonary/Chest: Effort normal and breath sounds normal. No respiratory  distress. Abdominal: Soft.  There is no tenderness. Psychiatric: Patient has a normal mood and affect. behavior is normal. Judgment and thought content normal.   Recent Results (from the past 2160 hour(s))  Iron, TIBC and Ferritin Panel     Status: Abnormal   Collection Time: 02/14/21  3:35 PM  Result Value Ref Range   Iron 15 (L) 45 - 160 mcg/dL    Comment: Verified by repeat analysis. Marland Kitchen    TIBC 412 250 - 450 mcg/dL (calc)   %SAT 4 (L) 16 - 45 % (calc)   Ferritin 2 (L) 16 - 232 ng/mL  Vitamin B12     Status: None   Collection Time: 02/14/21  3:35 PM  Result Value Ref Range   Vitamin B-12 318 200 - 1,100 pg/mL  Comment: . Please Note: Although the reference range for vitamin B12 is [phone] pg/mL, it has been reported that between 5 and 10% of patients with values between 200 and 400 pg/mL may experience neuropsychiatric and hematologic abnormalities due to occult B12 deficiency; less than 1% of patients with values above 400 pg/mL will have symptoms. .   CBC with Differential/Platelet     Status: Abnormal   Collection Time: 02/14/21  3:35 PM  Result Value Ref Range   WBC 7.2 3.8 - 10.8 Thousand/uL   RBC 3.77 (L) 3.80 - 5.10 Million/uL   Hemoglobin 6.7 (L) 11.7 - 15.5 g/dL   HCT 24.3 (L) 35.0 - 45.0 %   MCV 64.5 (L) 80.0 - 100.0 fL   MCH 17.8 (L) 27.0 - 33.0 pg   MCHC 27.6 (L) 32.0 - 36.0 g/dL   RDW 17.6 (H) 11.0 - 15.0 %   Platelets 463 (H) 140 - 400 Thousand/uL   MPV 10.4 7.5 - 12.5 fL   Neutro Abs 4,046 1,500 - 7,800 cells/uL   Lymphs Abs 2,664 850 - 3,900 cells/uL   Absolute Monocytes 374 200 - 950 cells/uL   Eosinophils Absolute 43 15 - 500 cells/uL   Basophils Absolute 72 0 - 200 cells/uL   Neutrophils Relative % 56.2 %   Total Lymphocyte 37.0 %   Monocytes Relative 5.2 %   Eosinophils Relative 0.6 %   Basophils Relative 1.0 %  TSH     Status: Abnormal   Collection Time: 02/14/21  3:35 PM  Result Value Ref Range   TSH 5.76 (H) 0.40 - 4.50 mIU/L      PHQ2/9: Depression screen [facility] 2/9 02/17/2021 02/14/2021 11/12/2020 08/10/2020 05/04/2020  Decreased Interest 1 1 0 0 0  Down, Depressed, Hopeless 1 1 1  0 0  PHQ - 2 Score 2 2 1  0 0  Altered sleeping 3 3 3 3 3   Tired, decreased energy 3 3 3  - 2  Change in appetite 1 1 3 3 3   Feeling bad or failure about yourself  0 0 0 0 0  Trouble concentrating 0 0 0 3 1  Moving slowly or fidgety/restless 0 0 0 0 0  Suicidal thoughts 0 0 0 0 0  PHQ-9 Score 9 9 10 9 9   Difficult doing work/chores - - - - Not difficult at all  Some recent data might be hidden    phq 9 is positive   Fall Risk: Fall Risk  02/17/2021 02/14/2021 11/12/2020 08/10/2020 05/04/2020  Falls in the past year? 0 0 0 0 1  Number falls in past yr: 0 0 0 0 1  Comment - - - - -  Injury with Fall? 0 0 0 0 0  Comment - - - - -  Risk for fall due to : No Fall Risks No Fall Risks No Fall Risks - History of fall(s)  Risk for fall due to: Comment - - - - -  Follow up Falls prevention discussed Falls prevention discussed Falls prevention discussed - Falls prevention discussed      Functional Status Survey: Is the patient deaf or have difficulty hearing?: Yes Does the patient have difficulty seeing, even when wearing glasses/contacts?: Yes Does the patient have difficulty concentrating, remembering, or making decisions?: Yes Does the patient have difficulty walking or climbing stairs?: No Does the patient have difficulty dressing or bathing?: No Does the patient have difficulty doing errands alone such as visiting a doctor's office or shopping?: No    Assessment &  Plan  1. Iron deficiency anemia, unspecified iron deficiency anemia type  - Hemoglobin and hematocrit, blood; Future - Ambulatory referral to Hematology / Oncology - discussed with Dr. Tasia Catchings , she will get patient schedule for visit and transfusion - Patient was instructed to [facility] if SOB or recurrent nose bleed or worsening of symptoms   2. Epistaxis  Stopped  now  3. Colon cancer screening  - Ambulatory referral to Gastroenterology  4. B12 deficiency  Discussed otc supplementation for now

## 2021-02-16 NOTE — Telephone Encounter (Signed)
Pt is scheduled for 02/17/21

## 2021-02-17 ENCOUNTER — Ambulatory Visit (INDEPENDENT_AMBULATORY_CARE_PROVIDER_SITE_OTHER): Payer: 59 | Admitting: Family Medicine

## 2021-02-17 ENCOUNTER — Inpatient Hospital Stay: Payer: 59 | Attending: Oncology

## 2021-02-17 ENCOUNTER — Other Ambulatory Visit: Payer: Self-pay | Admitting: Oncology

## 2021-02-17 ENCOUNTER — Encounter: Payer: Self-pay | Admitting: Family Medicine

## 2021-02-17 ENCOUNTER — Other Ambulatory Visit: Payer: Self-pay

## 2021-02-17 VITALS — BP 120/74 | HR 87 | Temp 98.0°F | Resp 16 | Ht 68.0 in | Wt 231.0 lb

## 2021-02-17 DIAGNOSIS — Z1211 Encounter for screening for malignant neoplasm of colon: Secondary | ICD-10-CM

## 2021-02-17 DIAGNOSIS — D508 Other iron deficiency anemias: Secondary | ICD-10-CM

## 2021-02-17 DIAGNOSIS — D509 Iron deficiency anemia, unspecified: Secondary | ICD-10-CM | POA: Insufficient documentation

## 2021-02-17 DIAGNOSIS — E079 Disorder of thyroid, unspecified: Secondary | ICD-10-CM | POA: Diagnosis not present

## 2021-02-17 DIAGNOSIS — R040 Epistaxis: Secondary | ICD-10-CM | POA: Diagnosis not present

## 2021-02-17 DIAGNOSIS — E538 Deficiency of other specified B group vitamins: Secondary | ICD-10-CM

## 2021-02-17 LAB — CBC WITH DIFFERENTIAL/PLATELET
Abs Immature Granulocytes: 0.03 10*3/uL (ref 0.00–0.07)
Basophils Absolute: 0.1 10*3/uL (ref 0.0–0.1)
Basophils Relative: 1 %
Eosinophils Absolute: 0 10*3/uL (ref 0.0–0.5)
Eosinophils Relative: 1 %
HCT: 25.2 % — ABNORMAL LOW (ref 36.0–46.0)
Hemoglobin: 6.8 g/dL — ABNORMAL LOW (ref 12.0–15.0)
Immature Granulocytes: 1 %
Lymphocytes Relative: 33 %
Lymphs Abs: 2 10*3/uL (ref 0.7–4.0)
MCH: 17.8 pg — ABNORMAL LOW (ref 26.0–34.0)
MCHC: 27 g/dL — ABNORMAL LOW (ref 30.0–36.0)
MCV: 66 fL — ABNORMAL LOW (ref 80.0–100.0)
Monocytes Absolute: 0.4 10*3/uL (ref 0.1–1.0)
Monocytes Relative: 6 %
Neutro Abs: 3.7 10*3/uL (ref 1.7–7.7)
Neutrophils Relative %: 58 %
Platelets: 413 10*3/uL — ABNORMAL HIGH (ref 150–400)
RBC: 3.82 MIL/uL — ABNORMAL LOW (ref 3.87–5.11)
RDW: 19.6 % — ABNORMAL HIGH (ref 11.5–15.5)
WBC: 6.2 10*3/uL (ref 4.0–10.5)
nRBC: 0 % (ref 0.0–0.2)

## 2021-02-17 LAB — SAMPLE TO BLOOD BANK

## 2021-02-17 LAB — IRON AND TIBC
Iron: 17 ug/dL — ABNORMAL LOW (ref 28–170)
Saturation Ratios: 4 % — ABNORMAL LOW (ref 10.4–31.8)
TIBC: 428 ug/dL (ref 250–450)
UIBC: 411 ug/dL

## 2021-02-17 LAB — VITAMIN B12: Vitamin B-12: 236 pg/mL (ref 180–914)

## 2021-02-17 LAB — FERRITIN: Ferritin: 2 ng/mL — ABNORMAL LOW (ref 11–307)

## 2021-02-17 NOTE — Patient Instructions (Signed)
P: [phone] F: [phone]

## 2021-02-18 ENCOUNTER — Inpatient Hospital Stay: Payer: 59 | Admitting: Oncology

## 2021-02-18 ENCOUNTER — Inpatient Hospital Stay (HOSPITAL_BASED_OUTPATIENT_CLINIC_OR_DEPARTMENT_OTHER): Payer: 59 | Admitting: Oncology

## 2021-02-18 ENCOUNTER — Inpatient Hospital Stay: Payer: 59

## 2021-02-18 VITALS — BP 138/78 | HR 88 | Temp 96.0°F | Ht 68.0 in | Wt 231.1 lb

## 2021-02-18 DIAGNOSIS — E079 Disorder of thyroid, unspecified: Secondary | ICD-10-CM | POA: Diagnosis not present

## 2021-02-18 DIAGNOSIS — D508 Other iron deficiency anemias: Secondary | ICD-10-CM

## 2021-02-18 DIAGNOSIS — D509 Iron deficiency anemia, unspecified: Secondary | ICD-10-CM

## 2021-02-18 LAB — ABO/RH: ABO/RH(D): A POS

## 2021-02-18 LAB — HAPTOGLOBIN: Haptoglobin: 165 mg/dL (ref 33–346)

## 2021-02-18 LAB — PREPARE RBC (CROSSMATCH)

## 2021-02-18 MED ORDER — DIPHENHYDRAMINE HCL 50 MG/ML IJ SOLN
25.0000 mg | Freq: Once | INTRAMUSCULAR | Status: AC
  Administered 2021-02-18: 25 mg via INTRAVENOUS
  Filled 2021-02-18: qty 1

## 2021-02-18 MED ORDER — SODIUM CHLORIDE 0.9% IV SOLUTION
250.0000 mL | Freq: Once | INTRAVENOUS | Status: AC
  Administered 2021-02-18: 250 mL via INTRAVENOUS
  Filled 2021-02-18: qty 250

## 2021-02-18 MED ORDER — ACETAMINOPHEN 325 MG PO TABS
650.0000 mg | ORAL_TABLET | Freq: Once | ORAL | Status: AC
  Administered 2021-02-18: 650 mg via ORAL
  Filled 2021-02-18: qty 2

## 2021-02-18 MED ORDER — SODIUM CHLORIDE 0.9% FLUSH
3.0000 mL | INTRAVENOUS | Status: DC | PRN
  Filled 2021-02-18: qty 3

## 2021-02-19 LAB — BPAM RBC
Blood Product Expiration Date: 202212292359
Blood Product Expiration Date: 202212292359
ISSUE DATE / TIME: 202212091135
ISSUE DATE / TIME: 202212091346
Unit Type and Rh: 6200
Unit Type and Rh: 6200

## 2021-02-19 LAB — TYPE AND SCREEN
ABO/RH(D): A POS
Antibody Screen: NEGATIVE
Unit division: 0
Unit division: 0

## 2021-02-20 ENCOUNTER — Encounter: Payer: Self-pay | Admitting: Oncology

## 2021-02-20 NOTE — Progress Notes (Signed)
[place]  Telephone:(336) [phone] Fax:(336) [phone]  ID: Amedeo Kinsman OB: [birth year]  MR#: [id number]  GYB#:[id number]  Patient Care Team: Steele Sizer, MD as PCP - General (Family Medicine) Minna Merritts, MD as Consulting Physician (Cardiology)  CHIEF COMPLAINT: Iron deficiency anemia.  INTERVAL HISTORY: Patient is a 60 year old female who is seen as an acute add-on after being found to have a significantly decreased hemoglobin of less than 7.0.  She has increased weakness and fatigue, but otherwise feels well.  She has no neurologic complaints.  She denies any recent fevers or illnesses.  She has a good appetite and denies weight loss.  She has no chest pain, shortness of breath, cough, or hemoptysis.  She denies any nausea, vomiting, constipation, or diarrhea.  She has no urinary complaints.  Patient otherwise feels well and offers no further specific complaints today.  REVIEW OF SYSTEMS:   Review of Systems  Constitutional:  Positive for malaise/fatigue. Negative for fever and weight loss.  Respiratory: Negative.  Negative for cough, hemoptysis and shortness of breath.   Cardiovascular: Negative.  Negative for chest pain and leg swelling.  Gastrointestinal: Negative.  Negative for abdominal pain, blood in stool and melena.  Genitourinary: Negative.  Negative for hematuria.  Musculoskeletal: Negative.  Negative for back pain.  Skin: Negative.  Negative for rash.  Neurological:  Positive for weakness. Negative for dizziness, focal weakness and headaches.  Psychiatric/Behavioral: Negative.  The patient is not nervous/anxious.    As per HPI. Otherwise, a complete review of systems is negative.  PAST MEDICAL HISTORY: Past Medical History:  Diagnosis Date   Allergy    Arthritis    Depression    Erythrodermic psoriasis    Fibromyalgia    GERD (gastroesophageal reflux disease)    Hyperlipemia    Insomnia    Obesity    Recurrent HSV (herpes simplex  virus)    Sleep apnea    pt states does not use CPAP   Thyroid disease    Vitamin D deficiency     PAST SURGICAL HISTORY: Past Surgical History:  Procedure Laterality Date   ABDOMINAL HYSTERECTOMY  [id number]   bladder tact     BREAST BIOPSY Right 09/02/2015   US Guided biopsy - Ribbon shaped marker - negative   CHOLECYSTECTOMY  [id number]   COLONOSCOPY     COLONOSCOPY WITH PROPOFOL N/A 02/10/2016   Procedure: COLONOSCOPY WITH PROPOFOL;  Surgeon: Lucilla Lame, MD;  Location: [place];  Service: Endoscopy;  Laterality: N/A;   ESOPHAGOGASTRODUODENOSCOPY     ESOPHAGOGASTRODUODENOSCOPY (EGD) WITH PROPOFOL N/A 02/10/2016   Procedure: ESOPHAGOGASTRODUODENOSCOPY (EGD) WITH PROPOFOL;  Surgeon: Lucilla Lame, MD;  Location: [place];  Service: Endoscopy;  Laterality: N/A;  sleep apnea   LIPOMA EXCISION     POLYPECTOMY  02/10/2016   Procedure: POLYPECTOMY;  Surgeon: Lucilla Lame, MD;  Location: [place];  Service: Endoscopy;;   TONSILECTOMY, ADENOIDECTOMY, BILATERAL MYRINGOTOMY AND TUBES      FAMILY HISTORY: Family History  Problem Relation Age of Onset   Hypertension Mother    Arthritis Mother    Cancer Mother        ovarian,breast, lung, liver   Breast cancer Mother 10   Ovarian cancer Mother 30   Lung cancer Mother 16   Colon cancer Brother    Other Father        lung problems   Ovarian cancer Maternal Grandmother        pt thinks is was ovarian but not  sure   Heart disease Brother    Cancer Maternal Grandfather     ADVANCED DIRECTIVES (Y/N):  N  HEALTH MAINTENANCE: Social History   Tobacco Use   Smoking status: Never   Smokeless tobacco: Never  Vaping Use   Vaping Use: Never used  Substance Use Topics   Alcohol use: Yes    Alcohol/week: 0.0 standard drinks    Comment: rarely   Drug use: No     Colonoscopy:  PAP:  Bone density:  Lipid panel:  Not on File  Current Outpatient Medications  Medication Sig Dispense Refill    amphetamine-dextroamphetamine (ADDERALL XR) 15 MG 24 hr capsule Take 1 capsule by mouth every morning. 30 capsule 0   amphetamine-dextroamphetamine (ADDERALL XR) 15 MG 24 hr capsule Take 1 capsule by mouth every morning. 30 capsule 0   amphetamine-dextroamphetamine (ADDERALL XR) 15 MG 24 hr capsule Take 1 capsule by mouth every morning. 30 capsule 0   baclofen (LIORESAL) 10 MG tablet Take 1 tablet (10 mg total) by mouth at bedtime. 90 each 0   buPROPion (WELLBUTRIN XL) 150 MG 24 hr tablet Every morning 90 tablet 1   Ca Phosphate-Cholecalciferol 200-200 MG-UNIT CHEW Chew 1 each by mouth daily.     citalopram (CELEXA) 20 MG tablet Daily 90 tablet 1   levothyroxine (SYNTHROID) 200 MCG tablet Take 0.5-1 tablets (100-200 mcg total) by mouth daily. But only half on Sundays, recheck in 6 weeks 90 tablet 0   Melatonin 5 MG CHEW Chew 10 mg by mouth.      meloxicam (MOBIC) 15 MG tablet TAKE 1 TABLET(15 MG) BY MOUTH DAILY AS NEEDED FOR PAIN 90 tablet 0   omeprazole (PRILOSEC) 20 MG capsule TAKE 1 CAPSULE BY MOUTH TWICE DAILY 180 capsule 1   phenylephrine (SUDAFED PE) 10 MG TABS tablet Take by mouth.     Turmeric 500 MG CAPS Take by mouth.     valACYclovir (VALTREX) 1000 MG tablet TAKE 1 TABLET(1000 MG) BY MOUTH TWICE DAILY 30 tablet 0   No current facility-administered medications for this visit.    OBJECTIVE: Vitals:   02/18/21 0933  BP: 138/78  Pulse: 88  Temp: (!) 96 F (35.6 C)     Body mass index is 35.14 kg/m.    ECOG FS:1 - Symptomatic but completely ambulatory  General: Well-developed, well-nourished, no acute distress. Eyes: Pink conjunctiva, anicteric sclera. HEENT: Normocephalic, moist mucous membranes. Lungs: No audible wheezing or coughing. Heart: Regular rate and rhythm. Abdomen: Soft, nontender, no obvious distention. Musculoskeletal: No edema, cyanosis, or clubbing. Neuro: Alert, answering all questions appropriately. Cranial nerves grossly intact. Skin: No rashes or  petechiae noted. Psych: Normal affect. Lymphatics: No cervical, calvicular, axillary or inguinal LAD.   LAB RESULTS:  Lab Results  Component Value Date   NA 137 11/12/2020   K 4.4 11/12/2020   CL 103 11/12/2020   CO2 29 11/12/2020   GLUCOSE 79 11/12/2020   BUN 13 11/12/2020   CREATININE 0.93 11/12/2020   CALCIUM 9.6 11/12/2020   PROT 7.3 11/12/2020   ALBUMIN 3.3 (L) 11/06/2017   AST 14 11/12/2020   ALT 14 11/12/2020   ALKPHOS 123 11/06/2017   BILITOT 0.3 11/12/2020   GFRNONAA 64 10/17/2019   GFRAA 74 10/17/2019    Lab Results  Component Value Date   WBC 6.2 02/17/2021   NEUTROABS 3.7 02/17/2021   HGB 6.8 (L) 02/17/2021   HCT 25.2 (L) 02/17/2021   MCV 66.0 (L) 02/17/2021   PLT 413 (H) 02/17/2021  Lab Results  Component Value Date   IRON 17 (L) 02/17/2021   TIBC 428 02/17/2021   IRONPCTSAT 4 (L) 02/17/2021   Lab Results  Component Value Date   FERRITIN 2 (L) 02/17/2021     STUDIES: No results found.  ASSESSMENT: Iron deficiency anemia.  PLAN:    Iron deficiency anemia: Patient noted to have a significantly decreased hemoglobin and iron stores and she is also symptomatic.  The remainder of her laboratory work is either negative or within normal limits.  She has been referred to GI by her primary care physician for consideration of colonoscopy and EGD.  Proceed with 1 unit packed red blood cells today.  Return to clinic in 3 weeks with laboratory work, further evaluation and consideration of blood or IV iron if necessary.  I spent a total of 60 minutes reviewing chart data, face-to-face evaluation with the patient, counseling and coordination of care as detailed above.   Patient expressed understanding and was in agreement with this plan. She also understands that She can call clinic at any time with any questions, concerns, or complaints.    Lloyd Huger, MD   02/20/2021 7:03 AM

## 2021-02-27 ENCOUNTER — Other Ambulatory Visit: Payer: Self-pay | Admitting: Family Medicine

## 2021-02-27 DIAGNOSIS — B001 Herpesviral vesicular dermatitis: Secondary | ICD-10-CM

## 2021-02-28 ENCOUNTER — Other Ambulatory Visit: Payer: Self-pay | Admitting: Family Medicine

## 2021-02-28 DIAGNOSIS — F339 Major depressive disorder, recurrent, unspecified: Secondary | ICD-10-CM

## 2021-02-28 DIAGNOSIS — E039 Hypothyroidism, unspecified: Secondary | ICD-10-CM

## 2021-03-03 ENCOUNTER — Other Ambulatory Visit: Payer: Self-pay | Admitting: *Deleted

## 2021-03-03 DIAGNOSIS — D508 Other iron deficiency anemias: Secondary | ICD-10-CM

## 2021-03-07 NOTE — Progress Notes (Signed)
[place]  Telephone:(336) [phone] Fax:(336) [phone]  ID: Amedeo Kinsman OB: 1961-02-26  MR#: [id number]  HYW#:[id number]  Patient Care Team: Steele Sizer, MD as PCP - General (Family Medicine) Minna Merritts, MD as Consulting Physician (Cardiology)  CHIEF COMPLAINT: Iron deficiency anemia.  INTERVAL HISTORY: Patient returns to clinic today for repeat laboratory work, further evaluation, and consideration of IV iron.  She continues to have weakness and fatigue, but this is significantly improved after receiving a blood transfusion several weeks ago.  She otherwise feels well. She has no neurologic complaints.  She denies any recent fevers or illnesses.  She has a good appetite and denies weight loss.  She has no chest pain, shortness of breath, cough, or hemoptysis.  She denies any nausea, vomiting, constipation, or diarrhea.  She has no urinary complaints.  Patient offers no further specific complaints today.  REVIEW OF SYSTEMS:   Review of Systems  Constitutional:  Positive for malaise/fatigue. Negative for fever and weight loss.  Respiratory: Negative.  Negative for cough, hemoptysis and shortness of breath.   Cardiovascular: Negative.  Negative for chest pain and leg swelling.  Gastrointestinal: Negative.  Negative for abdominal pain, blood in stool and melena.  Genitourinary: Negative.  Negative for hematuria.  Musculoskeletal: Negative.  Negative for back pain.  Skin: Negative.  Negative for rash.  Neurological:  Positive for weakness. Negative for dizziness, focal weakness and headaches.  Psychiatric/Behavioral: Negative.  The patient is not nervous/anxious.    As per HPI. Otherwise, a complete review of systems is negative.  PAST MEDICAL HISTORY: Past Medical History:  Diagnosis Date   Allergy    Arthritis    Depression    Erythrodermic psoriasis    Fibromyalgia    GERD (gastroesophageal reflux disease)    Hyperlipemia    Insomnia    Obesity     Recurrent HSV (herpes simplex virus)    Sleep apnea    pt states does not use CPAP   Thyroid disease    Vitamin D deficiency     PAST SURGICAL HISTORY: Past Surgical History:  Procedure Laterality Date   ABDOMINAL HYSTERECTOMY  [id number]   bladder tact     BREAST BIOPSY Right 09/02/2015   US Guided biopsy - Ribbon shaped marker - negative   CHOLECYSTECTOMY  [id number]   COLONOSCOPY     COLONOSCOPY WITH PROPOFOL N/A 02/10/2016   Procedure: COLONOSCOPY WITH PROPOFOL;  Surgeon: Lucilla Lame, MD;  Location: [place];  Service: Endoscopy;  Laterality: N/A;   ESOPHAGOGASTRODUODENOSCOPY     ESOPHAGOGASTRODUODENOSCOPY (EGD) WITH PROPOFOL N/A 02/10/2016   Procedure: ESOPHAGOGASTRODUODENOSCOPY (EGD) WITH PROPOFOL;  Surgeon: Lucilla Lame, MD;  Location: [place];  Service: Endoscopy;  Laterality: N/A;  sleep apnea   LIPOMA EXCISION     POLYPECTOMY  02/10/2016   Procedure: POLYPECTOMY;  Surgeon: Lucilla Lame, MD;  Location: [place];  Service: Endoscopy;;   TONSILECTOMY, ADENOIDECTOMY, BILATERAL MYRINGOTOMY AND TUBES      FAMILY HISTORY: Family History  Problem Relation Age of Onset   Hypertension Mother    Arthritis Mother    Cancer Mother        ovarian,breast, lung, liver   Breast cancer Mother 51   Ovarian cancer Mother 69   Lung cancer Mother 84   Colon cancer Brother    Other Father        lung problems   Ovarian cancer Maternal Grandmother        pt thinks is was ovarian but  not sure   Heart disease Brother    Cancer Maternal Grandfather     ADVANCED DIRECTIVES (Y/N):  N  HEALTH MAINTENANCE: Social History   Tobacco Use   Smoking status: Never   Smokeless tobacco: Never  Vaping Use   Vaping Use: Never used  Substance Use Topics   Alcohol use: Yes    Alcohol/week: 0.0 standard drinks    Comment: rarely   Drug use: No     Colonoscopy:  PAP:  Bone density:  Lipid panel:  Not on File  Current Outpatient Medications   Medication Sig Dispense Refill   amphetamine-dextroamphetamine (ADDERALL XR) 15 MG 24 hr capsule Take 1 capsule by mouth every morning. 30 capsule 0   amphetamine-dextroamphetamine (ADDERALL XR) 15 MG 24 hr capsule Take 1 capsule by mouth every morning. 30 capsule 0   baclofen (LIORESAL) 10 MG tablet Take 1 tablet (10 mg total) by mouth at bedtime. 90 each 0   buPROPion (WELLBUTRIN XL) 150 MG 24 hr tablet Every morning 90 tablet 1   Ca Phosphate-Cholecalciferol 200-200 MG-UNIT CHEW Chew 1 each by mouth daily.     cephALEXin (KEFLEX) 500 MG capsule Take 500 mg by mouth 3 (three) times daily.     citalopram (CELEXA) 20 MG tablet TAKE 1 TABLET BY MOUTH DAILY. 90 tablet 1   levothyroxine (SYNTHROID) 200 MCG tablet TAKE 1/2 TABLET TO 1 TABLET BY MOUTH DAILY. BUT ONLY 1/2 TABLETS ON SUNDAYS, CHECK IN 6 WEEKS. 28 tablet 1   omeprazole (PRILOSEC) 20 MG capsule TAKE 1 CAPSULE BY MOUTH TWICE DAILY 180 capsule 1   ARIPiprazole (ABILIFY) 2 MG tablet Take by mouth. (Patient not taking: Reported on 03/10/2021)     Melatonin 5 MG CHEW Chew 10 mg by mouth.  (Patient not taking: Reported on 03/10/2021)     meloxicam (MOBIC) 15 MG tablet TAKE 1 TABLET(15 MG) BY MOUTH DAILY AS NEEDED FOR PAIN (Patient not taking: Reported on 03/10/2021) 90 tablet 0   phenylephrine (SUDAFED PE) 10 MG TABS tablet Take by mouth. (Patient not taking: Reported on 03/10/2021)     Turmeric 500 MG CAPS Take by mouth. (Patient not taking: Reported on 03/10/2021)     valACYclovir (VALTREX) 1000 MG tablet TAKE 1 TABLET(1000 MG) BY MOUTH TWICE DAILY (Patient not taking: Reported on 03/10/2021) 30 tablet 0   No current facility-administered medications for this visit.    OBJECTIVE: Vitals:   03/10/21 1013  BP: (!) 134/95  Pulse: 84  Resp: 16  Temp: (!) 97.5 F (36.4 C)  SpO2: 99%     Body mass index is 35.15 kg/m.    ECOG FS:0 - Asymptomatic  General: Well-developed, well-nourished, no acute distress. Eyes: Pink conjunctiva,  anicteric sclera. HEENT: Normocephalic, moist mucous membranes. Lungs: No audible wheezing or coughing. Heart: Regular rate and rhythm. Abdomen: Soft, nontender, no obvious distention. Musculoskeletal: No edema, cyanosis, or clubbing. Neuro: Alert, answering all questions appropriately. Cranial nerves grossly intact. Skin: No rashes or petechiae noted. Psych: Normal affect.   LAB RESULTS:  Lab Results  Component Value Date   NA 137 11/12/2020   K 4.4 11/12/2020   CL 103 11/12/2020   CO2 29 11/12/2020   GLUCOSE 79 11/12/2020   BUN 13 11/12/2020   CREATININE 0.93 11/12/2020   CALCIUM 9.6 11/12/2020   PROT 7.3 11/12/2020   ALBUMIN 3.3 (L) 11/06/2017   AST 14 11/12/2020   ALT 14 11/12/2020   ALKPHOS 123 11/06/2017   BILITOT 0.3 11/12/2020   GFRNONAA 64  10/17/2019   GFRAA 74 10/17/2019    Lab Results  Component Value Date   WBC 6.3 03/10/2021   NEUTROABS 3.7 03/10/2021   HGB 9.1 (L) 03/10/2021   HCT 31.9 (L) 03/10/2021   MCV 72.0 (L) 03/10/2021   PLT 438 (H) 03/10/2021   Lab Results  Component Value Date   IRON 17 (L) 02/17/2021   TIBC 428 02/17/2021   IRONPCTSAT 4 (L) 02/17/2021   Lab Results  Component Value Date   FERRITIN 2 (L) 02/17/2021     STUDIES: No results found.  ASSESSMENT: Iron deficiency anemia.  PLAN:    Iron deficiency anemia: Patient's hemoglobin has improved to 9.1 with blood transfusion.  Proceed with 200 mg IV Venofer tomorrow.  Return to clinic 4 more times over the next several weeks to receive additional treatment.  Patient reports her colonoscopy and EGD are scheduled for March 17, 2021.  Return to clinic in 3 months with repeat laboratory work, further evaluation, and consideration of additional treatment if needed.    I spent a total of 30 minutes reviewing chart data, face-to-face evaluation with the patient, counseling and coordination of care as detailed above.    Patient expressed understanding and was in agreement with this  plan. She also understands that She can call clinic at any time with any questions, concerns, or complaints.    Lloyd Huger, MD   03/10/2021 11:19 AM

## 2021-03-10 ENCOUNTER — Inpatient Hospital Stay: Payer: 59

## 2021-03-10 ENCOUNTER — Other Ambulatory Visit: Payer: Self-pay

## 2021-03-10 ENCOUNTER — Inpatient Hospital Stay (HOSPITAL_BASED_OUTPATIENT_CLINIC_OR_DEPARTMENT_OTHER): Payer: 59 | Admitting: Oncology

## 2021-03-10 VITALS — BP 134/95 | HR 84 | Temp 97.5°F | Resp 16 | Wt 231.2 lb

## 2021-03-10 DIAGNOSIS — D508 Other iron deficiency anemias: Secondary | ICD-10-CM

## 2021-03-10 DIAGNOSIS — D509 Iron deficiency anemia, unspecified: Secondary | ICD-10-CM | POA: Diagnosis not present

## 2021-03-10 LAB — CBC WITH DIFFERENTIAL/PLATELET
Abs Immature Granulocytes: 0.04 10*3/uL (ref 0.00–0.07)
Basophils Absolute: 0.1 10*3/uL (ref 0.0–0.1)
Basophils Relative: 1 %
Eosinophils Absolute: 0.1 10*3/uL (ref 0.0–0.5)
Eosinophils Relative: 1 %
HCT: 31.9 % — ABNORMAL LOW (ref 36.0–46.0)
Hemoglobin: 9.1 g/dL — ABNORMAL LOW (ref 12.0–15.0)
Immature Granulocytes: 1 %
Lymphocytes Relative: 33 %
Lymphs Abs: 2.1 10*3/uL (ref 0.7–4.0)
MCH: 20.5 pg — ABNORMAL LOW (ref 26.0–34.0)
MCHC: 28.5 g/dL — ABNORMAL LOW (ref 30.0–36.0)
MCV: 72 fL — ABNORMAL LOW (ref 80.0–100.0)
Monocytes Absolute: 0.4 10*3/uL (ref 0.1–1.0)
Monocytes Relative: 6 %
Neutro Abs: 3.7 10*3/uL (ref 1.7–7.7)
Neutrophils Relative %: 58 %
Platelets: 438 10*3/uL — ABNORMAL HIGH (ref 150–400)
RBC: 4.43 MIL/uL (ref 3.87–5.11)
RDW: 24.7 % — ABNORMAL HIGH (ref 11.5–15.5)
WBC: 6.3 10*3/uL (ref 4.0–10.5)
nRBC: 0 % (ref 0.0–0.2)

## 2021-03-10 LAB — SAMPLE TO BLOOD BANK

## 2021-03-11 ENCOUNTER — Inpatient Hospital Stay: Payer: 59

## 2021-03-11 VITALS — BP 132/70 | HR 72 | Temp 96.8°F | Resp 20

## 2021-03-11 DIAGNOSIS — D509 Iron deficiency anemia, unspecified: Secondary | ICD-10-CM | POA: Diagnosis not present

## 2021-03-11 DIAGNOSIS — D508 Other iron deficiency anemias: Secondary | ICD-10-CM

## 2021-03-11 MED ORDER — IRON SUCROSE 20 MG/ML IV SOLN
200.0000 mg | Freq: Once | INTRAVENOUS | Status: AC
  Administered 2021-03-11: 13:00:00 200 mg via INTRAVENOUS
  Filled 2021-03-11: qty 10

## 2021-03-11 MED ORDER — SODIUM CHLORIDE 0.9 % IV SOLN
Freq: Once | INTRAVENOUS | Status: AC
  Filled 2021-03-11: qty 250

## 2021-03-11 MED ORDER — SODIUM CHLORIDE 0.9 % IV SOLN: 200.0000 mg | Freq: Once | INTRAVENOUS | Status: DC

## 2021-03-11 NOTE — Patient Instructions (Signed)

## 2021-03-17 LAB — HM COLONOSCOPY

## 2021-03-18 ENCOUNTER — Other Ambulatory Visit: Payer: Self-pay

## 2021-03-18 ENCOUNTER — Inpatient Hospital Stay: Payer: 59 | Attending: Oncology

## 2021-03-18 VITALS — BP 131/71 | HR 67 | Temp 97.6°F | Resp 18

## 2021-03-18 DIAGNOSIS — D508 Other iron deficiency anemias: Secondary | ICD-10-CM | POA: Diagnosis present

## 2021-03-18 MED ORDER — IRON SUCROSE 20 MG/ML IV SOLN
200.0000 mg | Freq: Once | INTRAVENOUS | Status: AC
  Administered 2021-03-18: 200 mg via INTRAVENOUS
  Filled 2021-03-18: qty 10

## 2021-03-18 MED ORDER — SODIUM CHLORIDE 0.9 % IV SOLN: 200.0000 mg | Freq: Once | INTRAVENOUS | Status: DC

## 2021-03-18 MED ORDER — SODIUM CHLORIDE 0.9 % IV SOLN
Freq: Once | INTRAVENOUS | Status: AC
  Filled 2021-03-18: qty 250

## 2021-03-21 ENCOUNTER — Inpatient Hospital Stay: Payer: 59

## 2021-03-21 ENCOUNTER — Other Ambulatory Visit: Payer: Self-pay

## 2021-03-21 VITALS — BP 147/72 | HR 84 | Temp 96.8°F | Resp 18

## 2021-03-21 DIAGNOSIS — D508 Other iron deficiency anemias: Secondary | ICD-10-CM | POA: Diagnosis not present

## 2021-03-21 MED ORDER — IRON SUCROSE 20 MG/ML IV SOLN
200.0000 mg | Freq: Once | INTRAVENOUS | Status: AC
  Administered 2021-03-21: 200 mg via INTRAVENOUS
  Filled 2021-03-21: qty 10

## 2021-03-21 MED ORDER — SODIUM CHLORIDE 0.9 % IV SOLN: 200.0000 mg | Freq: Once | INTRAVENOUS | Status: DC

## 2021-03-21 MED ORDER — SODIUM CHLORIDE 0.9 % IV SOLN
Freq: Once | INTRAVENOUS | Status: AC
  Filled 2021-03-21: qty 250

## 2021-03-21 NOTE — Patient Instructions (Signed)
[facility]  Discharge Instructions: ?Thank you for choosing [facility] to provide your oncology and hematology care.  ?If you have a lab appointment with the Cancer Center, please go directly to the Cancer Center and check in at the registration area. ? ?Wear comfortable clothing and clothing appropriate for easy access to any Portacath or PICC line.  ? ?We strive to give you quality time with your provider. You may need to reschedule your appointment if you arrive late (15 or more minutes).  Arriving late affects you and other patients whose appointments are after yours.  Also, if you miss three or more appointments without notifying the office, you may be dismissed from the clinic at the provider?s discretion.    ?  ?For prescription refill requests, have your pharmacy contact our office and allow 72 hours for refills to be completed.   ? ?Today you received the following chemotherapy and/or immunotherapy agents VENOFER    ?  ?To help prevent nausea and vomiting after your treatment, we encourage you to take your nausea medication as directed. ? ?BELOW ARE SYMPTOMS THAT SHOULD BE REPORTED IMMEDIATELY: ?*FEVER GREATER THAN 100.4 F (38 ?C) OR HIGHER ?*CHILLS OR SWEATING ?*NAUSEA AND VOMITING THAT IS NOT CONTROLLED WITH YOUR NAUSEA MEDICATION ?*UNUSUAL SHORTNESS OF BREATH ?*UNUSUAL BRUISING OR BLEEDING ?*URINARY PROBLEMS (pain or burning when urinating, or frequent urination) ?*BOWEL PROBLEMS (unusual diarrhea, constipation, pain near the anus) ?TENDERNESS IN MOUTH AND THROAT WITH OR WITHOUT PRESENCE OF ULCERS (sore throat, sores in mouth, or a toothache) ?UNUSUAL RASH, SWELLING OR PAIN  ?UNUSUAL VAGINAL DISCHARGE OR ITCHING  ? ?Items with * indicate a potential emergency and should be followed up as soon as possible or go to the Emergency Department if any problems should occur. ? ?Please show the CHEMOTHERAPY ALERT CARD or IMMUNOTHERAPY ALERT CARD at check-in to the  Emergency Department and triage nurse. ? ?Should you have questions after your visit or need to cancel or reschedule your appointment, please contact [facility]  [phone] and follow the prompts.  Office hours are 8:00 a.m. to 4:30 p.m. Monday - Friday. Please note that voicemails left after 4:00 p.m. may not be returned until the following business day.  We are closed weekends and major holidays. You have access to a nurse at all times for urgent questions. Please call the main number to the clinic [phone] and follow the prompts. ? ?For any non-urgent questions, you may also contact your provider using MyChart. We now offer e-Visits for anyone 18 and older to request care online for non-urgent symptoms. For details visit [email or web address]. ?  ?Also download the MyChart app! Go to the app store, search "MyChart", open the app, select [facility], and log in with your MyChart username and password. ? ?Due to Covid, a mask is required upon entering the hospital/clinic. If you do not have a mask, one will be given to you upon arrival. For doctor visits, patients may have 1 support person aged 18 or older with them. For treatment visits, patients cannot have anyone with them due to current Covid guidelines and our immunocompromised population.  ? ?Iron Sucrose Injection ?What is this medication? ?IRON SUCROSE (EYE ern SOO krose) treats low levels of iron (iron deficiency anemia) in people with kidney disease. Iron is a mineral that plays an important role in making red blood cells, which carry oxygen from your lungs to the rest of your body. ?This medicine may   be used for other purposes; ask your health care provider or pharmacist if you have questions. ?COMMON BRAND NAME(S): Venofer ?What should I tell my care team before I take this medication? ?They need to know if you have any of these conditions: ?Anemia not caused by low iron levels ?Heart disease ?High levels of  iron in the blood ?Kidney disease ?Liver disease ?An unusual or allergic reaction to iron, other medications, foods, dyes, or preservatives ?Pregnant or trying to get pregnant ?Breast-feeding ?How should I use this medication? ?This medication is for infusion into a vein. It is given in a hospital or clinic setting. ?Talk to your care team about the use of this medication in children. While this medication may be prescribed for children as young as 2 years for selected conditions, precautions do apply. ?Overdosage: If you think you have taken too much of this medicine contact a poison control center or emergency room at once. ?NOTE: This medicine is only for you. Do not share this medicine with others. ?What if I miss a dose? ?It is important not to miss your dose. Call your care team if you are unable to keep an appointment. ?What may interact with this medication? ?Do not take this medication with any of the following: ?Deferoxamine ?Dimercaprol ?Other iron products ?This medication may also interact with the following: ?Chloramphenicol ?Deferasirox ?This list may not describe all possible interactions. Give your health care provider a list of all the medicines, herbs, non-prescription drugs, or dietary supplements you use. Also tell them if you smoke, drink alcohol, or use illegal drugs. Some items may interact with your medicine. ?What should I watch for while using this medication? ?Visit your care team regularly. Tell your care team if your symptoms do not start to get better or if they get worse. You may need blood work done while you are taking this medication. ?You may need to follow a special diet. Talk to your care team. Foods that contain iron include: whole grains/cereals, dried fruits, beans, or peas, leafy green vegetables, and organ meats (liver, kidney). ?What side effects may I notice from receiving this medication? ?Side effects that you should report to your care team as soon as  possible: ?Allergic reactions--skin rash, itching, hives, swelling of the face, lips, tongue, or throat ?Low blood pressure--dizziness, feeling faint or lightheaded, blurry vision ?Shortness of breath ?Side effects that usually do not require medical attention (report to your care team if they continue or are bothersome): ?Flushing ?Headache ?Joint pain ?Muscle pain ?Nausea ?Pain, redness, or irritation at injection site ?This list may not describe all possible side effects. Call your doctor for medical advice about side effects. You may report side effects to FDA at 1-800-FDA-1088. ?Where should I keep my medication? ?This medication is given in a hospital or clinic and will not be stored at home. ?NOTE: This sheet is a summary. It may not cover all possible information. If you have questions about this medicine, talk to your doctor, pharmacist, or health care provider. ?? 2022 Elsevier/Gold Standard (2020-07-23 00:00:00) ? ?

## 2021-03-22 ENCOUNTER — Telehealth: Payer: Self-pay

## 2021-03-22 NOTE — Telephone Encounter (Signed)
Copied from [place] [id number]. Topic: General - Other >> Mar 22, 2021  9:07 AM Tessa Lerner A wrote: Reason for CRM: Dr. Georga Bora with [facility] has additional questions about the patient's recently received records and would like to be reached on their cell phone when possible to discuss further with Dr. Ancil Boozer  Please contact further when available

## 2021-03-23 ENCOUNTER — Inpatient Hospital Stay: Payer: 59

## 2021-03-23 ENCOUNTER — Other Ambulatory Visit: Payer: Self-pay

## 2021-03-23 VITALS — BP 142/68 | HR 70 | Resp 18

## 2021-03-23 DIAGNOSIS — D508 Other iron deficiency anemias: Secondary | ICD-10-CM | POA: Diagnosis not present

## 2021-03-23 MED ORDER — SODIUM CHLORIDE 0.9 % IV SOLN: 200.0000 mg | Freq: Once | INTRAVENOUS | Status: DC

## 2021-03-23 MED ORDER — SODIUM CHLORIDE 0.9 % IV SOLN
Freq: Once | INTRAVENOUS | Status: AC
  Filled 2021-03-23: qty 250

## 2021-03-23 MED ORDER — IRON SUCROSE 20 MG/ML IV SOLN
200.0000 mg | Freq: Once | INTRAVENOUS | Status: AC
  Administered 2021-03-23: 200 mg via INTRAVENOUS
  Filled 2021-03-23: qty 10

## 2021-03-23 NOTE — Patient Instructions (Signed)
[facility] AT [place]  Discharge Instructions: Thank you for choosing [facility] to provide your oncology and hematology care.  If you have a lab appointment with the [facility], please go directly to the [facility] and check in at the registration area.  Wear comfortable clothing and clothing appropriate for easy access to any Portacath or PICC line.   We strive to give you quality time with your provider. You may need to reschedule your appointment if you arrive late (15 or more minutes).  Arriving late affects you and other patients whose appointments are after yours.  Also, if you miss three or more appointments without notifying the office, you may be dismissed from the clinic at the providers discretion.      For prescription refill requests, have your pharmacy contact our office and allow 72 hours for refills to be completed.    Today you received the following : Venofer   To help prevent nausea and vomiting after your treatment, we encourage you to take your nausea medication as directed.  BELOW ARE SYMPTOMS THAT SHOULD BE REPORTED IMMEDIATELY: *FEVER GREATER THAN 100.4 F (38 C) OR HIGHER *CHILLS OR SWEATING *NAUSEA AND VOMITING THAT IS NOT CONTROLLED WITH YOUR NAUSEA MEDICATION *UNUSUAL SHORTNESS OF BREATH *UNUSUAL BRUISING OR BLEEDING *URINARY PROBLEMS (pain or burning when urinating, or frequent urination) *BOWEL PROBLEMS (unusual diarrhea, constipation, pain near the anus) TENDERNESS IN MOUTH AND THROAT WITH OR WITHOUT PRESENCE OF ULCERS (sore throat, sores in mouth, or a toothache) UNUSUAL RASH, SWELLING OR PAIN  UNUSUAL VAGINAL DISCHARGE OR ITCHING   Items with * indicate a potential emergency and should be followed up as soon as possible or go to the Emergency Department if any problems should occur.  Please show the CHEMOTHERAPY ALERT CARD or IMMUNOTHERAPY ALERT CARD at check-in to the Emergency Department and triage  nurse.  Should you have questions after your visit or need to cancel or reschedule your appointment, please contact [facility]CER [place] AT [place]  [phone] and follow the prompts.  Office hours are 8:00 a.m. to 4:30 p.m. Monday - Friday. Please note that voicemails left after 4:00 p.m. may not be returned until the following business day.  We are closed weekends and major holidays. You have access to a nurse at all times for urgent questions. Please call the main number to the clinic [phone] and follow the prompts.  For any non-urgent questions, you may also contact your provider using MyChart. We now offer e-Visits for anyone 58 and older to request care online for non-urgent symptoms. For details visit [email or web address].   Also download the MyChart app! Go to the app store, search "MyChart", open the app, select [facility], and log in with your MyChart username and password.  Due to Covid, a mask is required upon entering the hospital/clinic. If you do not have a mask, one will be given to you upon arrival. For doctor visits, patients may have 1 support person aged 60 or older with them. For treatment visits, patients cannot have anyone with them due to current Covid guidelines and our immunocompromised population.

## 2021-03-28 ENCOUNTER — Inpatient Hospital Stay: Payer: 59

## 2021-03-28 ENCOUNTER — Other Ambulatory Visit: Payer: Self-pay

## 2021-03-28 VITALS — BP 142/83 | HR 75 | Temp 96.9°F | Resp 18

## 2021-03-28 DIAGNOSIS — D508 Other iron deficiency anemias: Secondary | ICD-10-CM | POA: Diagnosis not present

## 2021-03-28 MED ORDER — IRON SUCROSE 20 MG/ML IV SOLN
200.0000 mg | Freq: Once | INTRAVENOUS | Status: AC
  Administered 2021-03-28: 200 mg via INTRAVENOUS
  Filled 2021-03-28: qty 10

## 2021-03-28 MED ORDER — SODIUM CHLORIDE 0.9 % IV SOLN
Freq: Once | INTRAVENOUS | Status: AC
  Filled 2021-03-28: qty 250

## 2021-03-28 MED ORDER — SODIUM CHLORIDE 0.9 % IV SOLN: 200.0000 mg | Freq: Once | INTRAVENOUS | Status: DC

## 2021-04-04 ENCOUNTER — Ambulatory Visit: Payer: Self-pay | Admitting: *Deleted

## 2021-04-04 NOTE — Telephone Encounter (Signed)
Mailbox full

## 2021-04-04 NOTE — Telephone Encounter (Signed)
°  Chief Complaint: Vomiting "Black emesis" and "Black diarrhea" Symptoms: 2 episodes Saturday, 1 episode Friday, none today but has not had BM as of yet, lightheaded, upper GI pain Frequency: Onset Friday Pertinent Negatives: Patient denies  Disposition: [x] ED /[] Urgent Care (no appt availability in office) / [] Appointment(In office/virtual)/ []  [facility]/ [] Home Care/ [] Refused Recommended Disposition /[] [facility]/ []  Follow-up with PCP Additional Notes:  Advised ED. States will alert oncologist first.       Reason for Disposition  [1] Vomiting AND [2] contains red blood or black ("coffee ground") material  (Exception: few red streaks in vomit that only happened once)  Answer Assessment - Initial Assessment Questions 1. VOMITING SEVERITY: "How many times have you vomited in the past 24 hours?"     - MILD:  1 - 2 times/day    - MODERATE: 3 - 5 times/day, decreased oral intake without significant weight loss or symptoms of dehydration    - SEVERE: 6 or more times/day, vomits everything or nearly everything, with significant weight loss, symptoms of dehydration      None yesterday, 2 Saturday, 1 Friday 2. ONSET: "When did the vomiting begin?"      Friday 3. FLUIDS: "What fluids or food have you vomited up today?" "Have you been able to keep any fluids down?"     No vomiting today 4. ABDOMINAL PAIN: "Are your having any abdominal pain?" If yes : "How bad is it and what does it feel like?" (e.g., crampy, dull, intermittent, constant)      Upper abdominal pain, Did not have omeprazole. 5. DIARRHEA: "Is there any diarrhea?" If Yes, ask: "How many times today?"      Loose black stools same time frame 6. CONTACTS: "Is there anyone else in the family with the same symptoms?"       7. CAUSE: "What do you think is causing your vomiting?"      8. HYDRATION STATUS: "Any signs of dehydration?" (e.g., dry mouth [not only dry lips], too weak to stand) "When did you last  urinate?"     Urinating more frequently 9. OTHER SYMPTOMS: "Do you have any other symptoms?" (e.g., fever, headache, vertigo, vomiting blood or coffee grounds, recent head injury)     Lightheaded,  Protocols used: Vomiting-A-AH

## 2021-04-07 ENCOUNTER — Telehealth: Payer: Self-pay

## 2021-04-07 NOTE — Telephone Encounter (Signed)
Copied from [place] [phone]. Topic: General - Other >> Apr 07, 2021 11:33 AM [name] wrote: Reason for CRM: Pt called and stated that she would like a call back from Dr Ancil Boozer nurse regarding rheumatology appointment she had. She states the provider would like to follow up with Dr Ancil Boozer. Please advise.

## 2021-04-07 NOTE — Telephone Encounter (Signed)
Copied from [place] [phone]. Topic: General - Other >> Apr 07, 2021 11:33 AM [name] wrote: Reason for CRM: Pt called and stated that she would like a call back from Dr Ancil Boozer nurse regarding rheumatology appointment she had. She states the provider would like to follow up with Dr Ancil Boozer. Please advise.

## 2021-04-07 NOTE — Telephone Encounter (Signed)
Copied from [place] [phone]. Topic: General - Inquiry >> Apr 07, 2021  4:23 PM Greggory Keen D wrote: Dr Manuella Ghazi called regarding pt.  She seen this patient today and she has concerns that she wants to share with you.  She said she has talked with you regarding his patient before  CB# [phone]

## 2021-04-11 ENCOUNTER — Other Ambulatory Visit: Payer: Self-pay | Admitting: Family Medicine

## 2021-04-11 DIAGNOSIS — R634 Abnormal weight loss: Secondary | ICD-10-CM

## 2021-04-11 DIAGNOSIS — D509 Iron deficiency anemia, unspecified: Secondary | ICD-10-CM

## 2021-04-11 DIAGNOSIS — R159 Full incontinence of feces: Secondary | ICD-10-CM

## 2021-04-11 DIAGNOSIS — M5136 Other intervertebral disc degeneration, lumbar region: Secondary | ICD-10-CM

## 2021-04-12 ENCOUNTER — Telehealth: Payer: Self-pay

## 2021-04-12 NOTE — Telephone Encounter (Signed)
Copied from [place] [phone]. Topic: General - Other >> Apr 12, 2021  3:15 PM Yvette Rack wrote: Reason for CRM: Pt called for an update on order for the scans she is suppose to have done. Cb# [phone]

## 2021-04-12 NOTE — Telephone Encounter (Signed)
Spoke with patient, orders were placed yesterday. Informed her that it usually takes a few days for the referral/insurance/coordination to get completed and someone would reach out to her to schedule. I also provided her with the number for scheduling in case she does not hear from them or would like to contact them directly.

## 2021-04-15 ENCOUNTER — Other Ambulatory Visit: Payer: Self-pay

## 2021-04-15 ENCOUNTER — Ambulatory Visit: Payer: Self-pay | Admitting: *Deleted

## 2021-04-15 ENCOUNTER — Ambulatory Visit
Admission: RE | Admit: 2021-04-15 | Discharge: 2021-04-15 | Disposition: A | Discharge status: Home / Self Care | Payer: 59 | Source: Ambulatory Visit | Attending: Family Medicine | Admitting: Family Medicine

## 2021-04-15 ENCOUNTER — Other Ambulatory Visit: Payer: Self-pay | Admitting: Family Medicine

## 2021-04-15 DIAGNOSIS — M5136 Other intervertebral disc degeneration, lumbar region: Secondary | ICD-10-CM | POA: Insufficient documentation

## 2021-04-15 DIAGNOSIS — D509 Iron deficiency anemia, unspecified: Secondary | ICD-10-CM | POA: Insufficient documentation

## 2021-04-15 DIAGNOSIS — M51369 Other intervertebral disc degeneration, lumbar region without mention of lumbar back pain or lower extremity pain: Secondary | ICD-10-CM

## 2021-04-15 DIAGNOSIS — R634 Abnormal weight loss: Secondary | ICD-10-CM | POA: Diagnosis present

## 2021-04-15 DIAGNOSIS — R159 Full incontinence of feces: Secondary | ICD-10-CM | POA: Insufficient documentation

## 2021-04-15 MED ORDER — IOHEXOL 300 MG/ML  SOLN
100.0000 mL | Freq: Once | INTRAMUSCULAR | Status: AC | PRN
  Administered 2021-04-15: 100 mL via INTRAVENOUS

## 2021-04-15 NOTE — Telephone Encounter (Signed)
Stat CT Scan Result:  No evidence of malignancy or pathology.  Moderate stool burden in transverse colon.  Moderate sized hiatal hernia in the distal esophagus.  Office was closed for lunch so high priority note sent to [employer] .

## 2021-04-21 ENCOUNTER — Other Ambulatory Visit: Payer: Self-pay | Admitting: Family Medicine

## 2021-04-21 ENCOUNTER — Other Ambulatory Visit: Payer: Self-pay

## 2021-04-21 ENCOUNTER — Ambulatory Visit
Admission: RE | Admit: 2021-04-21 | Discharge: 2021-04-21 | Disposition: A | Discharge status: Home / Self Care | Payer: 59 | Source: Ambulatory Visit | Attending: Family Medicine | Admitting: Family Medicine

## 2021-04-21 DIAGNOSIS — Z1231 Encounter for screening mammogram for malignant neoplasm of breast: Secondary | ICD-10-CM | POA: Diagnosis not present

## 2021-04-21 DIAGNOSIS — E039 Hypothyroidism, unspecified: Secondary | ICD-10-CM

## 2021-04-22 ENCOUNTER — Other Ambulatory Visit: Payer: Self-pay

## 2021-04-22 ENCOUNTER — Other Ambulatory Visit: Payer: Self-pay | Admitting: Family Medicine

## 2021-04-22 DIAGNOSIS — R928 Other abnormal and inconclusive findings on diagnostic imaging of breast: Secondary | ICD-10-CM

## 2021-04-22 DIAGNOSIS — F339 Major depressive disorder, recurrent, unspecified: Secondary | ICD-10-CM

## 2021-04-22 DIAGNOSIS — M797 Fibromyalgia: Secondary | ICD-10-CM

## 2021-04-22 DIAGNOSIS — R921 Mammographic calcification found on diagnostic imaging of breast: Secondary | ICD-10-CM

## 2021-04-22 NOTE — Telephone Encounter (Signed)
Medication: amphetamine-dextroamphetamine (ADDERALL XR) 15 MG 24 hr capsule [[id number]]   Has the patient contacted their pharmacy YES   (Agent: If no, request that the patient contact the pharmacy for the refill. If patient does not wish to contact the pharmacy document the reason why and proceed with request.) (Agent: If yes, when and what did the pharmacy advise?)  Preferred Pharmacy (with phone number or street name): [facility], [place] - [address] AT [address] [place] [address] Phone: [phone] Fax: [phone] Hours: Not open 24 hours   Has the patient been seen for an appointment in the last year OR does the patient have an upcoming appointment? YES 05/16/21  Agent: Please be advised that RX refills may take up to 3 business days. We ask that you follow-up with your pharmacy.

## 2021-04-22 NOTE — Telephone Encounter (Signed)
Requested medications are due for refill today.  yes  Requested medications are on the active medications list.  yes  Last refill. 11/12/2020 #30  Future visit scheduled.   yes  Notes to clinic.  Medication not delegated. Also pt has 2 rx's for this medication on med list.    Requested Prescriptions  Pending Prescriptions Disp Refills   amphetamine-dextroamphetamine (ADDERALL XR) 15 MG 24 hr capsule 30 capsule 0    Sig: Take 1 capsule by mouth every morning.     Not Delegated - Psychiatry:  Stimulants/ADHD Failed - 04/22/2021  1:26 PM      Failed - This refill cannot be delegated      Failed - Urine Drug Screen completed in last 360 days      Failed - Last BP in normal range    BP Readings from Last 1 Encounters:  03/28/21 (!) 142/83          Passed - Last Heart Rate in normal range    Pulse Readings from Last 1 Encounters:  03/28/21 75          Passed - Valid encounter within last 6 months    Recent Outpatient Visits           2 months ago Iron deficiency anemia, unspecified iron deficiency anemia type   [facility] Steele Sizer, MD   2 months ago Interstitial lung disease [facility])   [facility] Steele Sizer, MD   5 months ago Adult hypothyroidism   [facility] Steele Sizer, MD   8 months ago Morbid obesity [facility])   [facility] Steele Sizer, MD   11 months ago Morbid obesity [facility])   [facility] Steele Sizer, MD       Future Appointments             In 3 weeks Steele Sizer, MD [facility], [facility]

## 2021-04-25 MED ORDER — AMPHETAMINE-DEXTROAMPHET ER 15 MG PO CP24: 15.0000 mg | ORAL_CAPSULE | ORAL | 0 refills | Status: DC

## 2021-05-02 ENCOUNTER — Other Ambulatory Visit: Payer: Self-pay | Admitting: Family Medicine

## 2021-05-02 DIAGNOSIS — M2550 Pain in unspecified joint: Secondary | ICD-10-CM

## 2021-05-02 DIAGNOSIS — R768 Other specified abnormal immunological findings in serum: Secondary | ICD-10-CM

## 2021-05-04 ENCOUNTER — Ambulatory Visit
Admission: RE | Admit: 2021-05-04 | Discharge: 2021-05-04 | Disposition: A | Discharge status: Home / Self Care | Payer: 59 | Source: Ambulatory Visit | Attending: Family Medicine | Admitting: Family Medicine

## 2021-05-04 ENCOUNTER — Other Ambulatory Visit: Payer: Self-pay

## 2021-05-04 DIAGNOSIS — R928 Other abnormal and inconclusive findings on diagnostic imaging of breast: Secondary | ICD-10-CM | POA: Insufficient documentation

## 2021-05-04 DIAGNOSIS — R921 Mammographic calcification found on diagnostic imaging of breast: Secondary | ICD-10-CM | POA: Insufficient documentation

## 2021-05-13 NOTE — Progress Notes (Signed)
Name: Debra Soto   MRN: [id number]    DOB: [birth year]   Date:05/16/2021       Progress Note  Subjective  Chief Complaint  Follow Up  HPI  Interstitial Lung Disease: seen by Dr. Brennan Bailey 03/2019 for SOB and abnormal CT chest 02/2019 , showed some granulomatous disease also a nodule, She states SOB only with moderate activity, she also  has a chronic cough. She is not on any inhalers, she lost to follow up with pulmonologist, but has an appointment scheduled for May 2023   Positive ANA and myalgi/Sicca : seen Dr. Manuella Ghazi - Rheumatologist at [facility] , but she had lost to follow up, but has been back since Fall 2022. CRP has been elevated but down from 70 to 9 in January 2023. She told me she is taking some little blue pills but I cannot see it on Dr. Trena Platt note   Hypothyroidism: she is on 200 mcg synthroid one daily and  skipping Saturday and Sunday, last TSH was at goal. She has dry skin, bowels are unchanged    Major Depression: she was has been taking Citalopram and Wellbutrin since 10/2016, she still has memory difficulties but stable - has mental fogginess from [facility].  She has ADD and is on Adderal. She has Abilify on her current medication list but she is not sure if she is taking it, she will contact me with a list of medications she has been taking. Explained the need of taking all medication to all doctors visits   FMS: she stopped Lyrica because it caused her to get moody. She states she is always is pain, symptoms waxes and wanes.   Chronic low back pain: going on for over one year, she was supposed to have MRI lumbar spine for bowel incontinence but it was denied. She has seen GI since and was diagnosed with constipation , soiling is likely the reason for incontinence. She had a colonoscopy since last visit . X-ray showed DDD L5-S1 and osteopenia, discussed bone density study . Denies saddle anesthesia    Obesity: BMI is over 35 with co-morbidities such as GERD, OSA and dyslipidemia . She  had lost 21 lbs before her last visit She is gaining it back now, she states over the past week she has been craving starches.   Hyperlipidemia: she is trying to eat healthier, we will recheck it yearly   GERD and hiatal hernia: under the care of GI, had EGD and colonoscopy but unable to see, she will send me a copy through my chart, she is taking medications and sucralfate , she has been able to eat again.   Patient Active Problem List   Diagnosis Date Noted   Morbid obesity ([place]) 12/27/2018   Iron deficiency anemia secondary to inadequate dietary iron intake 11/06/2017   Benign neoplasm of descending colon    Heartburn    Gastric polyp    Psoriasis 01/25/2016   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/22/2015   Endometriosis 12/31/2014   Insomnia, persistent 09/29/2014   IBS (irritable bowel syndrome) 09/29/2014   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/2014   Dyslipidemia 09/29/2014   Fibromyalgia syndrome 09/29/2014   Gastro-esophageal reflux disease without esophagitis 09/29/2014   Herpes labialis 09/29/2014   Adult hypothyroidism 09/29/2014   Family history of malignant neoplasm of breast 09/29/2014   Major depression, recurrent, chronic ([place]) 09/29/2014   Obstructive apnea 09/29/2014   Abnormal presence of protein in urine 09/29/2014   Allergic rhinitis 09/29/2014   Vitamin D deficiency 09/29/2014  Urge incontinence 09/29/2014   Headache, tension-type 09/29/2014   Menopausal symptom 09/29/2014    Past Surgical History:  Procedure Laterality Date   ABDOMINAL HYSTERECTOMY  [id number]   bladder tact     BREAST BIOPSY Right 09/02/2015   US Guided biopsy - Ribbon shaped marker - negative   CHOLECYSTECTOMY  [id number]   COLONOSCOPY     COLONOSCOPY WITH PROPOFOL N/A 02/10/2016   Procedure: COLONOSCOPY WITH PROPOFOL;  Surgeon: Lucilla Lame, MD;  Location: [place];  Service: Endoscopy;  Laterality: N/A;   ESOPHAGOGASTRODUODENOSCOPY     ESOPHAGOGASTRODUODENOSCOPY (EGD) WITH PROPOFOL  N/A 02/10/2016   Procedure: ESOPHAGOGASTRODUODENOSCOPY (EGD) WITH PROPOFOL;  Surgeon: Lucilla Lame, MD;  Location: [place];  Service: Endoscopy;  Laterality: N/A;  sleep apnea   LIPOMA EXCISION     POLYPECTOMY  02/10/2016   Procedure: POLYPECTOMY;  Surgeon: Lucilla Lame, MD;  Location: [place];  Service: Endoscopy;;   TONSILECTOMY, ADENOIDECTOMY, BILATERAL MYRINGOTOMY AND TUBES      Family History  Problem Relation Age of Onset   Hypertension Mother    Arthritis Mother    Cancer Mother        ovarian,breast, lung, liver   Breast cancer Mother 93   Ovarian cancer Mother 45   Lung cancer Mother 44   Colon cancer Brother    Other Father        lung problems   Ovarian cancer Maternal Grandmother        pt thinks is was ovarian but not sure   Heart disease Brother    Cancer Maternal Grandfather     Social History   Tobacco Use   Smoking status: Never   Smokeless tobacco: Never  Substance Use Topics   Alcohol use: Yes    Alcohol/week: 0.0 standard drinks    Comment: rarely     Current Outpatient Medications:    amphetamine-dextroamphetamine (ADDERALL XR) 15 MG 24 hr capsule, Take 1 capsule by mouth every morning., Disp: 30 capsule, Rfl: 0   amphetamine-dextroamphetamine (ADDERALL XR) 15 MG 24 hr capsule, Take 1 capsule by mouth every morning., Disp: 30 capsule, Rfl: 0   baclofen (LIORESAL) 10 MG tablet, Take 1 tablet (10 mg total) by mouth at bedtime., Disp: 90 each, Rfl: 0   buPROPion (WELLBUTRIN XL) 150 MG 24 hr tablet, Every morning, Disp: 90 tablet, Rfl: 1   Ca Phosphate-Cholecalciferol 200-200 MG-UNIT CHEW, Chew 1 each by mouth daily., Disp: , Rfl:    citalopram (CELEXA) 20 MG tablet, TAKE 1 TABLET BY MOUTH DAILY., Disp: 90 tablet, Rfl: 1   levothyroxine (SYNTHROID) 200 MCG tablet, TAKE 1 TABLET BY MOUTH DAILY BUT ONLY 1/2 TABLET ON SUNDAYS. CHECK IN 6 WEEKS, Disp: 28 tablet, Rfl: 1   meloxicam (MOBIC) 15 MG tablet, TAKE 1 TABLET(15 MG) BY MOUTH  DAILY AS NEEDED FOR PAIN, Disp: 90 tablet, Rfl: 0   omeprazole (PRILOSEC) 40 MG capsule, Take 40 mg by mouth in the morning and at bedtime., Disp: , Rfl:    sucralfate (CARAFATE) 1 g tablet, Take 1 tablet by mouth in the morning, at noon, in the evening, and at bedtime., Disp: , Rfl:    valACYclovir (VALTREX) 1000 MG tablet, TAKE 1 TABLET(1000 MG) BY MOUTH TWICE DAILY, Disp: 30 tablet, Rfl: 0   ARIPiprazole (ABILIFY) 2 MG tablet, Take by mouth. (Patient not taking: Reported on 05/16/2021), Disp: , Rfl:    Melatonin 5 MG CHEW, Chew 10 mg by mouth.  (Patient not taking: Reported on 05/16/2021), Disp: ,  Rfl:    phenylephrine (SUDAFED PE) 10 MG TABS tablet, Take by mouth. (Patient not taking: Reported on 05/16/2021), Disp: , Rfl:    Turmeric 500 MG CAPS, Take by mouth. (Patient not taking: Reported on 05/16/2021), Disp: , Rfl:   Not on File  I personally reviewed active problem list, medication list, allergies, family history, social history, health maintenance with the patient/caregiver today.   ROS  Constitutional: Negative for fever or weight change.  Respiratory: Negative for cough and shortness of breath.   Cardiovascular: Negative for chest pain or palpitations.  Gastrointestinal: Negative for abdominal pain, no bowel changes.  Musculoskeletal: Negative for gait problem or joint swelling.  Skin: Negative for rash.  Neurological: Negative for dizziness or headache.  No other specific complaints in a complete review of systems (except as listed in HPI above).   Objective  Vitals:   05/16/21 0853  BP: 128/74  Pulse: 95  Resp: 16  SpO2: 98%  Weight: 235 lb (106.6 kg)  Height: 5\' 8"  (1.727 m)    Body mass index is 35.73 kg/m.  Physical Exam  Constitutional: Patient appears well-developed and well-nourished. Obese  No distress.  HEENT: head atraumatic, normocephalic, pupils equal and reactive to light,  neck supple Cardiovascular: Normal rate, regular rhythm and normal heart sounds.   No murmur heard. No BLE edema. Pulmonary/Chest: Effort normal and breath sounds normal. No respiratory distress. Abdominal: Soft.  There is no tenderness. Psychiatric: Patient has a normal mood and affect. behavior is normal. Judgment and thought content normal.   Recent Results (from the past 2160 hour(s))  Hold Tube- Blood Bank     Status: None   Collection Time: 02/17/21 12:49 PM  Result Value Ref Range   Blood Bank Specimen SAMPLE AVAILABLE FOR TESTING    Sample Expiration      02/20/2021,2359 Performed at [facility], [address] [place], [place] [address]   Type and screen     Status: None   Collection Time: 02/17/21 12:49 PM  Result Value Ref Range   ABO/RH(D) A POS    Antibody Screen NEG    Sample Expiration 02/20/2021,2359    Unit Number [id number]    Blood Component Type RED CELLS,LR    Unit division 00    Status of Unit ISSUED,FINAL    Transfusion Status OK TO TRANSFUSE    Crossmatch Result      Compatible Performed at [facility], [address] [place], [place] [address]    Unit Number [id number]    Blood Component Type RED CELLS,LR    Unit division 00    Status of Unit ISSUED,FINAL    Transfusion Status OK TO TRANSFUSE    Crossmatch Result Compatible   BPAM RBC     Status: None   Collection Time: 02/17/21 12:49 PM  Result Value Ref Range   ISSUE DATE / TIME 355974163845    Blood Product Unit Number [id number]    PRODUCT CODE [id number]    Unit Type and Rh 6200    Blood Product Expiration Date 888916945038    ISSUE DATE / TIME [id number]    Blood Product Unit Number [id number]    PRODUCT CODE [id number]    Unit Type and Rh 6200    Blood Product Expiration Date 078675449201   Haptoglobin     Status: None   Collection Time: 02/17/21 12:56 PM  Result Value Ref Range   Haptoglobin 165 33 - 346 mg/dL    Comment: (NOTE) Performed At: [facility]  [facility] [place], [place] [id number] Rush Farmer MD  YQ:[id number]   Vitamin B12     Status: None   Collection Time: 02/17/21 12:56 PM  Result Value Ref Range   Vitamin B-12 236 180 - 914 pg/mL    Comment: (NOTE) This assay is not validated for testing neonatal or myeloproliferative syndrome specimens for Vitamin B12 levels. Performed at [facility], [place] [address], [place], [place] [address]   Ferritin     Status: Abnormal   Collection Time: 02/17/21 12:56 PM  Result Value Ref Range   Ferritin 2 (L) 11 - 307 ng/mL    Comment: Performed at [facility], [address] [place], [place] [address]  CBC with Differential     Status: Abnormal   Collection Time: 02/17/21 12:56 PM  Result Value Ref Range   WBC 6.2 4.0 - 10.5 K/uL   RBC 3.82 (L) 3.87 - 5.11 MIL/uL   Hemoglobin 6.8 (L) 12.0 - 15.0 g/dL    Comment: Reticulocyte Hemoglobin testing may be clinically indicated, consider ordering this additional test [id number]    HCT 25.2 (L) 36.0 - 46.0 %   MCV 66.0 (L) 80.0 - 100.0 fL   MCH 17.8 (L) 26.0 - 34.0 pg   MCHC 27.0 (L) 30.0 - 36.0 g/dL   RDW 19.6 (H) 11.5 - 15.5 %   Platelets 413 (H) 150 - 400 K/uL   nRBC 0.0 0.0 - 0.2 %   Neutrophils Relative % 58 %   Neutro Abs 3.7 1.7 - 7.7 K/uL   Lymphocytes Relative 33 %   Lymphs Abs 2.0 0.7 - 4.0 K/uL   Monocytes Relative 6 %   Monocytes Absolute 0.4 0.1 - 1.0 K/uL   Eosinophils Relative 1 %   Eosinophils Absolute 0.0 0.0 - 0.5 K/uL   Basophils Relative 1 %   Basophils Absolute 0.1 0.0 - 0.1 K/uL   Immature Granulocytes 1 %   Abs Immature Granulocytes 0.03 0.00 - 0.07 K/uL    Comment: Performed at [facility], [address] [place], [place] [address]  Iron and TIBC     Status: Abnormal   Collection Time: 02/17/21 12:56 PM  Result Value Ref Range   Iron 17 (L) 28 - 170 ug/dL   TIBC 428 250 - 450 ug/dL   Saturation Ratios 4 (L) 10.4 - 31.8 %   UIBC 411 ug/dL    Comment: Performed at [facility], [address] [place], [place] [address]   Prepare RBC (crossmatch)     Status: None   Collection Time: 02/17/21  3:30 PM  Result Value Ref Range   Order Confirmation      ORDER PROCESSED BY BLOOD BANK Performed at [facility], [address], [place], [place] [address]   ABO/Rh     Status: None   Collection Time: 02/18/21 10:22 AM  Result Value Ref Range   ABO/RH(D)      A POS Performed at [facility], [address], [place], [place] [address]   Hold Tube- Blood Bank     Status: None   Collection Time: 03/10/21  9:49 AM  Result Value Ref Range   Blood Bank Specimen SAMPLE AVAILABLE FOR TESTING    Sample Expiration      03/13/2021,2359 Performed at [facility], [address], [place], [place] [address]   CBC with Differential     Status: Abnormal   Collection Time: 03/10/21  9:49 AM  Result Value Ref Range   WBC 6.3 4.0 - 10.5 K/uL   RBC 4.43 3.87 - 5.11 MIL/uL   Hemoglobin 9.1 (L) 12.0 - 15.0 g/dL    Comment: Reticulocyte Hemoglobin testing may be clinically indicated, consider ordering this additional test [id number]    HCT 31.9 (L) 36.0 - 46.0 %   MCV 72.0 (L) 80.0 - 100.0 fL   MCH 20.5 (L) 26.0 - 34.0 pg   MCHC 28.5 (L) 30.0 - 36.0 g/dL   RDW 24.7 (H) 11.5 - 15.5 %   Platelets 438 (H) 150 - 400 K/uL   nRBC 0.0 0.0 - 0.2 %   Neutrophils Relative % 58 %   Neutro Abs 3.7 1.7 - 7.7 K/uL   Lymphocytes Relative 33 %   Lymphs Abs 2.1 0.7 - 4.0 K/uL   Monocytes Relative 6 %   Monocytes Absolute 0.4 0.1 - 1.0 K/uL   Eosinophils Relative 1 %   Eosinophils Absolute 0.1 0.0 - 0.5 K/uL   Basophils Relative 1 %   Basophils Absolute 0.1 0.0 - 0.1 K/uL   Immature Granulocytes 1 %   Abs Immature Granulocytes 0.04 0.00 - 0.07 K/uL    Comment: Performed at [facility], [address] [place], [place] [address]    PHQ2/9: Depression screen [facility] 2/9 05/16/2021 02/17/2021 02/14/2021 11/12/2020 08/10/2020  Decreased Interest 0 1 1 0 0  Down, Depressed, Hopeless 0 1 1 1  0  PHQ - 2  Score 0 2 2 1  0  Altered sleeping 3 3 3 3 3   Tired, decreased energy 3 3 3 3  -  Change in appetite 0 1 1 3 3   Feeling bad or failure about yourself  1 0 0 0 0  Trouble concentrating 3 0 0 0 3  Moving slowly or fidgety/restless 0 0 0 0 0  Suicidal thoughts 0 0 0 0 0  PHQ-9 Score 10 9 9 10 9   Difficult doing work/chores - - - - -  Some recent data might be hidden    phq 9 is positive   Fall Risk: Fall Risk  05/16/2021 02/17/2021 02/14/2021 11/12/2020 08/10/2020  Falls in the past year? 1 0 0 0 0  Number falls in past yr: 0 0 0 0 0  Comment - - - - -  Injury with Fall? 1 0 0 0 0  Comment - - - - -  Risk for fall due to : No Fall Risks No Fall Risks No Fall Risks No Fall Risks -  Risk for fall due to: Comment - - - - -  Follow up Falls prevention discussed Falls prevention discussed Falls prevention discussed Falls prevention discussed -      Functional Status Survey: Is the patient deaf or have difficulty hearing?: Yes Does the patient have difficulty seeing, even when wearing glasses/contacts?: Yes Does the patient have difficulty concentrating, remembering, or making decisions?: Yes Does the patient have difficulty walking or climbing stairs?: No Does the patient have difficulty dressing or bathing?: No Does the patient have difficulty doing errands alone such as visiting a doctor's office or shopping?: No    Assessment & Plan  1. Interstitial lung disease ([place])  Keep follow up with pulmonologist   2. Major depression, recurrent, chronic ([facility])  - amphetamine-dextroamphetamine (ADDERALL XR) 15 MG 24 hr capsule; Take 1 capsule by mouth every morning.  Dispense: 30 capsule; Refill: 0 - amphetamine-dextroamphetamine (ADDERALL XR) 15 MG 24 hr capsule; Take 1 capsule by mouth every morning.  Dispense: 30 capsule; Refill: 0 - amphetamine-dextroamphetamine (ADDERALL XR) 15 MG 24 hr capsule; Take 1 capsule by mouth every morning.  Dispense: 30 capsule; Refill: 0 - buPROPion  (WELLBUTRIN XL) 150 MG 24 hr tablet; Every morning  Dispense: 90 tablet; Refill: 1  3. Morbid obesity ([place])  Weight is stable now   4. Essential (hemorrhagic) thrombocythemia ([place])  On arms and stable  5. Incontinence of feces, unspecified fecal incontinence type  See,s tp be soiling due to constipation  6. Dyslipidemia   7. Gastro-esophageal reflux disease without esophagitis   8. Iron deficiency anemia secondary to inadequate dietary iron intake  Getting iron infusion   9. Sicca unspecified ( [place] )    10. Fibromyalgia syndrome  - amphetamine-dextroamphetamine (ADDERALL XR) 15 MG 24 hr capsule; Take 1 capsule by mouth every morning.  Dispense: 30 capsule; Refill: 0 - amphetamine-dextroamphetamine (ADDERALL XR) 15 MG 24 hr capsule; Take 1 capsule by mouth every morning.  Dispense: 30 capsule; Refill: 0  11. Abnormal weight loss   12. Adult hypothyroidism  - levothyroxine (SYNTHROID) 200 MCG tablet; Take 1 tablet (200 mcg total) by mouth daily before breakfast. From M- F, skip weekends  Dispense: 75 tablet; Refill: 0

## 2021-05-16 ENCOUNTER — Ambulatory Visit (INDEPENDENT_AMBULATORY_CARE_PROVIDER_SITE_OTHER): Payer: 59 | Admitting: Family Medicine

## 2021-05-16 ENCOUNTER — Encounter: Payer: Self-pay | Admitting: Family Medicine

## 2021-05-16 VITALS — BP 128/74 | HR 95 | Resp 16 | Ht 68.0 in | Wt 235.0 lb

## 2021-05-16 DIAGNOSIS — K219 Gastro-esophageal reflux disease without esophagitis: Secondary | ICD-10-CM

## 2021-05-16 DIAGNOSIS — R634 Abnormal weight loss: Secondary | ICD-10-CM

## 2021-05-16 DIAGNOSIS — E039 Hypothyroidism, unspecified: Secondary | ICD-10-CM

## 2021-05-16 DIAGNOSIS — D473 Essential (hemorrhagic) thrombocythemia: Secondary | ICD-10-CM | POA: Diagnosis not present

## 2021-05-16 DIAGNOSIS — E785 Hyperlipidemia, unspecified: Secondary | ICD-10-CM

## 2021-05-16 DIAGNOSIS — R159 Full incontinence of feces: Secondary | ICD-10-CM

## 2021-05-16 DIAGNOSIS — F339 Major depressive disorder, recurrent, unspecified: Secondary | ICD-10-CM

## 2021-05-16 DIAGNOSIS — M3500 Sicca syndrome, unspecified: Secondary | ICD-10-CM

## 2021-05-16 DIAGNOSIS — J849 Interstitial pulmonary disease, unspecified: Secondary | ICD-10-CM | POA: Diagnosis not present

## 2021-05-16 DIAGNOSIS — M797 Fibromyalgia: Secondary | ICD-10-CM

## 2021-05-16 DIAGNOSIS — Z1211 Encounter for screening for malignant neoplasm of colon: Secondary | ICD-10-CM

## 2021-05-16 DIAGNOSIS — D508 Other iron deficiency anemias: Secondary | ICD-10-CM

## 2021-05-16 MED ORDER — LEVOTHYROXINE SODIUM 200 MCG PO TABS: 200.0000 ug | ORAL_TABLET | Freq: Every day | ORAL | 0 refills | Status: DC

## 2021-05-16 MED ORDER — AMPHETAMINE-DEXTROAMPHET ER 15 MG PO CP24: 15.0000 mg | ORAL_CAPSULE | ORAL | 0 refills | Status: DC

## 2021-05-16 MED ORDER — BUPROPION HCL ER (XL) 150 MG PO TB24: ORAL_TABLET | ORAL | 1 refills | Status: DC

## 2021-06-07 ENCOUNTER — Inpatient Hospital Stay (HOSPITAL_BASED_OUTPATIENT_CLINIC_OR_DEPARTMENT_OTHER): Payer: 59 | Admitting: Oncology

## 2021-06-07 ENCOUNTER — Other Ambulatory Visit: Payer: Self-pay

## 2021-06-07 ENCOUNTER — Encounter: Payer: Self-pay | Admitting: Oncology

## 2021-06-07 ENCOUNTER — Inpatient Hospital Stay: Payer: 59 | Attending: Oncology

## 2021-06-07 ENCOUNTER — Inpatient Hospital Stay: Payer: 59

## 2021-06-07 VITALS — BP 126/86 | HR 73 | Temp 96.5°F | Resp 16 | Ht 68.0 in | Wt 233.0 lb

## 2021-06-07 DIAGNOSIS — D508 Other iron deficiency anemias: Secondary | ICD-10-CM | POA: Insufficient documentation

## 2021-06-07 DIAGNOSIS — Z79899 Other long term (current) drug therapy: Secondary | ICD-10-CM | POA: Insufficient documentation

## 2021-06-07 LAB — CBC WITH DIFFERENTIAL/PLATELET
Abs Immature Granulocytes: 0.01 10*3/uL (ref 0.00–0.07)
Basophils Absolute: 0.1 10*3/uL (ref 0.0–0.1)
Basophils Relative: 1 %
Eosinophils Absolute: 0 10*3/uL (ref 0.0–0.5)
Eosinophils Relative: 0 %
HCT: 37.9 % (ref 36.0–46.0)
Hemoglobin: 11.6 g/dL — ABNORMAL LOW (ref 12.0–15.0)
Immature Granulocytes: 0 %
Lymphocytes Relative: 37 %
Lymphs Abs: 2.1 10*3/uL (ref 0.7–4.0)
MCH: 25.9 pg — ABNORMAL LOW (ref 26.0–34.0)
MCHC: 30.6 g/dL (ref 30.0–36.0)
MCV: 84.6 fL (ref 80.0–100.0)
Monocytes Absolute: 0.3 10*3/uL (ref 0.1–1.0)
Monocytes Relative: 5 %
Neutro Abs: 3.2 10*3/uL (ref 1.7–7.7)
Neutrophils Relative %: 57 %
Platelets: 333 10*3/uL (ref 150–400)
RBC: 4.48 MIL/uL (ref 3.87–5.11)
RDW: 16.8 % — ABNORMAL HIGH (ref 11.5–15.5)
WBC: 5.7 10*3/uL (ref 4.0–10.5)
nRBC: 0 % (ref 0.0–0.2)

## 2021-06-07 LAB — SAMPLE TO BLOOD BANK

## 2021-06-07 NOTE — Progress Notes (Signed)
?[facility]  ?[phone] [phone] ? ?ID: Debra Soto OB: 03/18/1960  MR#: [id number]  YSA#:[id number] ? ?Patient Care Team: ?Steele Sizer, MD as PCP - General (Family Medicine) ?Minna Merritts, MD as Consulting Physician (Cardiology) ? ?CHIEF COMPLAINT: Iron deficiency anemia. ? ?INTERVAL HISTORY: Patient returns to clinic today for repeat laboratory work, further evaluation, and consideration of additional IV Venofer.  She states her weakness and fatigue has improved and she is essentially back to her baseline. She has no neurologic complaints.  She denies any recent fevers or illnesses.  She has a good appetite and denies weight loss.  She has no chest pain, shortness of breath, cough, or hemoptysis.  She denies any nausea, vomiting, constipation, or diarrhea.  She has no urinary complaints.  Patient offers no specific complaints today. ? ?REVIEW OF SYSTEMS:   ?Review of Systems  ?Constitutional: Negative.  Negative for fever, malaise/fatigue and weight loss.  ?Respiratory: Negative.  Negative for cough, hemoptysis and shortness of breath.   ?Cardiovascular: Negative.  Negative for chest pain and leg swelling.  ?Gastrointestinal: Negative.  Negative for abdominal pain, blood in stool and melena.  ?Genitourinary: Negative.  Negative for hematuria.  ?Musculoskeletal: Negative.  Negative for back pain.  ?Skin: Negative.  Negative for rash.  ?Neurological: Negative.  Negative for dizziness, focal weakness, weakness and headaches.  ?Psychiatric/Behavioral: Negative.  The patient is not nervous/anxious.   ? ?As per HPI. Otherwise, a complete review of systems is negative. ? ?PAST MEDICAL HISTORY: ?Past Medical History:  ?Diagnosis Date  ? Allergy   ? Arthritis   ? Depression   ? Erythrodermic psoriasis   ? Fibromyalgia   ? GERD (gastroesophageal reflux disease)   ? Hyperlipemia   ? Insomnia   ? Obesity   ? Recurrent HSV (herpes simplex virus)   ? Sleep apnea   ? pt  states does not use CPAP  ? Thyroid disease   ? Vitamin D deficiency   ? ? ?PAST SURGICAL HISTORY: ?Past Surgical History:  ?Procedure Laterality Date  ? ABDOMINAL HYSTERECTOMY  [id number]  ? bladder tact    ? BREAST BIOPSY Right 09/02/2015  ? US Guided biopsy - Ribbon shaped marker - negative  ? CHOLECYSTECTOMY  [id number]  ? COLONOSCOPY    ? COLONOSCOPY WITH PROPOFOL N/A 02/10/2016  ? Procedure: COLONOSCOPY WITH PROPOFOL;  Surgeon: Lucilla Lame, MD;  Location: [place];  Service: Endoscopy;  Laterality: N/A;  ? ESOPHAGOGASTRODUODENOSCOPY    ? ESOPHAGOGASTRODUODENOSCOPY (EGD) WITH PROPOFOL N/A 02/10/2016  ? Procedure: ESOPHAGOGASTRODUODENOSCOPY (EGD) WITH PROPOFOL;  Surgeon: Lucilla Lame, MD;  Location: [place];  Service: Endoscopy;  Laterality: N/A;  sleep apnea  ? LIPOMA EXCISION    ? POLYPECTOMY  02/10/2016  ? Procedure: POLYPECTOMY;  Surgeon: Lucilla Lame, MD;  Location: [place];  Service: Endoscopy;;  ? TONSILECTOMY, ADENOIDECTOMY, BILATERAL MYRINGOTOMY AND TUBES    ? ? ?FAMILY HISTORY: ?Family History  ?Problem Relation Age of Onset  ? Hypertension Mother   ? Arthritis Mother   ? Cancer Mother   ?     ovarian,breast, lung, liver  ? Breast cancer Mother 75  ? Ovarian cancer Mother 27  ? Lung cancer Mother 42  ? Colon cancer Brother   ? Other Father   ?     lung problems  ? Ovarian cancer Maternal Grandmother   ?     pt thinks is was ovarian but not sure  ? Heart disease Brother   ? Cancer  Maternal Grandfather   ? ? ?ADVANCED DIRECTIVES (Y/N):  N ? ?HEALTH MAINTENANCE: ?Social History  ? ?Tobacco Use  ? Smoking status: Never  ? Smokeless tobacco: Never  ?Vaping Use  ? Vaping Use: Never used  ?Substance Use Topics  ? Alcohol use: Yes  ?  Alcohol/week: 0.0 standard drinks  ?  Comment: rarely  ? Drug use: No  ? ? ? Colonoscopy: ? PAP: ? Bone density: ? Lipid panel: ? ?No Known Allergies ? ?Current Outpatient Medications  ?Medication Sig Dispense Refill  ? amphetamine-dextroamphetamine  (ADDERALL XR) 15 MG 24 hr capsule Take 1 capsule by mouth every morning. 30 capsule 0  ? ARIPiprazole (ABILIFY) 2 MG tablet Take by mouth.    ? baclofen (LIORESAL) 10 MG tablet Take 1 tablet (10 mg total) by mouth at bedtime. 90 each 0  ? buPROPion (WELLBUTRIN XL) 150 MG 24 hr tablet Every morning 90 tablet 1  ? citalopram (CELEXA) 20 MG tablet TAKE 1 TABLET BY MOUTH DAILY. 90 tablet 1  ? levothyroxine (SYNTHROID) 200 MCG tablet Take 1 tablet (200 mcg total) by mouth daily before breakfast. From M- F, skip weekends 75 tablet 0  ? meloxicam (MOBIC) 15 MG tablet TAKE 1 TABLET(15 MG) BY MOUTH DAILY AS NEEDED FOR PAIN 90 tablet 0  ? omeprazole (PRILOSEC) 40 MG capsule Take 40 mg by mouth in the morning and at bedtime.    ? sucralfate (CARAFATE) 1 g tablet Take 1 tablet by mouth in the morning, at noon, in the evening, and at bedtime.    ? valACYclovir (VALTREX) 1000 MG tablet TAKE 1 TABLET(1000 MG) BY MOUTH TWICE DAILY 30 tablet 0  ? Ca Phosphate-Cholecalciferol 200-200 MG-UNIT CHEW Chew 1 each by mouth daily.    ? ?No current facility-administered medications for this visit.  ? ? ?OBJECTIVE: ?Vitals:  ? 06/07/21 1312  ?BP: 126/86  ?Pulse: 73  ?Resp: 16  ?Temp: (!) 96.5 ?F (35.8 ?C)  ?SpO2: 100%  ?   Body mass index is 35.43 kg/m?Marland Kitchen    ECOG FS:0 - Asymptomatic ? ?General: Well-developed, well-nourished, no acute distress. ?Eyes: Pink conjunctiva, anicteric sclera. ?HEENT: Normocephalic, moist mucous membranes. ?Lungs: No audible wheezing or coughing. ?Heart: Regular rate and rhythm. ?Abdomen: Soft, nontender, no obvious distention. ?Musculoskeletal: No edema, cyanosis, or clubbing. ?Neuro: Alert, answering all questions appropriately. Cranial nerves grossly intact. ?Skin: No rashes or petechiae noted. ?Psych: Normal affect. ? ?LAB RESULTS: ? ?Lab Results  ?Component Value Date  ? NA 137 11/12/2020  ? K 4.4 11/12/2020  ? CL 103 11/12/2020  ? CO2 29 11/12/2020  ? GLUCOSE 79 11/12/2020  ? BUN 13 11/12/2020  ? CREATININE  0.93 11/12/2020  ? CALCIUM 9.6 11/12/2020  ? PROT 7.3 11/12/2020  ? ALBUMIN 3.3 (L) 11/06/2017  ? AST 14 11/12/2020  ? ALT 14 11/12/2020  ? ALKPHOS 123 11/06/2017  ? BILITOT 0.3 11/12/2020  ? GFRNONAA 64 10/17/2019  ? GFRAA 74 10/17/2019  ? ? ?Lab Results  ?Component Value Date  ? WBC 5.7 06/07/2021  ? NEUTROABS 3.2 06/07/2021  ? HGB 11.6 (L) 06/07/2021  ? HCT 37.9 06/07/2021  ? MCV 84.6 06/07/2021  ? PLT 333 06/07/2021  ? ?Lab Results  ?Component Value Date  ? IRON 17 (L) 02/17/2021  ? TIBC 428 02/17/2021  ? IRONPCTSAT 4 (L) 02/17/2021  ? ?Lab Results  ?Component Value Date  ? FERRITIN 2 (L) 02/17/2021  ? ? ? ?STUDIES: ?No results found. ? ?ASSESSMENT: Iron deficiency anemia. ? ?PLAN:   ? ?  Iron deficiency anemia: Patient's hemoglobin has significantly improved to 11.6 and she is asymptomatic.  Iron stores were not drawn today.  She does not require additional Venofer today.  Patient last received treatment on March 28, 2021.  Patient underwent colonoscopy on March 17, 2021 that did not report any significant pathology.  Return to clinic in 3 months with repeat laboratory work, further evaluation, and consideration of treatment if needed.   ? ?I spent a total of 20 minutes reviewing chart data, face-to-face evaluation with the patient, counseling and coordination of care as detailed above. ? ? ?Patient expressed understanding and was in agreement with this plan. She also understands that She can call clinic at any time with any questions, concerns, or complaints.  ? ? ?Lloyd Huger, MD   06/09/2021 5:20 AM ? ? ? ? ?

## 2021-06-09 ENCOUNTER — Encounter: Payer: Self-pay | Admitting: Oncology

## 2021-07-07 ENCOUNTER — Other Ambulatory Visit: Payer: Self-pay | Admitting: Family Medicine

## 2021-07-07 DIAGNOSIS — E039 Hypothyroidism, unspecified: Secondary | ICD-10-CM

## 2021-08-02 ENCOUNTER — Other Ambulatory Visit: Payer: Self-pay | Admitting: Family Medicine

## 2021-08-02 DIAGNOSIS — E039 Hypothyroidism, unspecified: Secondary | ICD-10-CM

## 2021-08-02 NOTE — Progress Notes (Unsigned)
Name: Debra Soto   MRN: [id number]    DOB: 1960/09/11   Date:08/03/2021       Progress Note  Subjective  Chief Complaint  Follow Up  HPI  Interstitial Lung Disease: seen by Dr. Brennan Bailey 03/2019 for SOB and abnormal CT chest 02/2019 , showed some granulomatous disease also a nodule, She states SOB only with moderate activity, she also  has a chronic cough. She is not on any inhalers, she lost to follow up with pulmonologist, but she has a visit scheduled for this Friday.   Memory changes and family history of Alzheimer's: she needs to write everything down, she has noticed it is getting worse, does not seem to be just her fibromyalgia fog. She is having difficulty even expressing herself during office visits. She states she needs to have direct questions to be able to answer.   Positive ANA and myalgi/Sicca : seen Dr. Manuella Ghazi - Rheumatologist at [facility] , but she had lost to follow up for years , but has been back since Fall 2022. CRP has been elevated , she told me again today that she is taking a little blue pill and a medication that starts with a G. The medication is gabapentin ( saw on Dr. Trena Platt note today). She had a rash last year associated with joint aches and Dr. Manuella Ghazi is considering starting her on plaquenil   Hypothyroidism: she is on 200 mcg synthroid one daily and  skipping Saturday and Sunday, last TSH was at goal. We will recheck level today    Major Depression: she was has been taking Citalopram and Wellbutrin since 10/2016, she  states her memory is getting worse.  She has ADD and is on Adderal. She used to take Abilify but today she told me she does not have it at home ( she forgot to bring her medications with her again) . Explained the need of taking all medication to all doctors visits   FMS: she stopped Lyrica because it caused her to get moody. Currently on Gabapentin , she states she is always sore but not sure how to quantify the pain   Chronic low back pain: symptoms  started over one year ago,  she was supposed to have MRI lumbar spine for bowel incontinence but it was denied. She has seen GI since and was diagnosed with constipation , soiling is likely the reason for incontinence. She had a colonoscopy since last visit. She had two episodes of bowel incontinence and urgency -   MRI lumbar spine: done 06/05/2021 Degenerative disc and facet disease are most pronounced in the lower lumbar spine resulting in mild foraminal stenosis bilaterally at L5-S1.     Obesity: BMI is over 35 with co-morbidities such as GERD, OSA and dyslipidemia . She had lost 46 lbs last year, but she is trending up again. She is frustrated about the weight gain  Hyperlipidemia: she is trying to eat healthier, last LDL was 112  GERD and hiatal hernia: under the care of GI, had EGD and colonoscopy but unable to see results and we will request a copy. 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/2014   Dyslipidemia 09/29/2014   Fibromyalgia syndrome 09/29/2014   Gastro-esophageal reflux disease without esophagitis 09/29/2014   Herpes labialis 09/29/2014   Adult hypothyroidism 09/29/2014   Family history of malignant neoplasm of breast 09/29/2014   Major depression, recurrent, chronic ([place]) 09/29/2014   Obstructive apnea 09/29/2014   Abnormal presence of protein in urine 09/29/2014   Allergic rhinitis 09/29/2014   Vitamin D deficiency 09/29/2014    Urge incontinence 09/29/2014   Headache, tension-type 09/29/2014   Menopausal symptom 09/29/2014    Past Surgical History:  Procedure Laterality Date   ABDOMINAL HYSTERECTOMY  [id number]   bladder tact     BREAST BIOPSY Right 09/02/2015   US Guided biopsy - Ribbon shaped marker - negative   CHOLECYSTECTOMY  [id number]   COLONOSCOPY     COLONOSCOPY WITH PROPOFOL N/A 02/10/2016   Procedure: COLONOSCOPY WITH PROPOFOL;  Surgeon: Lucilla Lame, MD;  Location: [place];  Service: Endoscopy;  Laterality: N/A;   ESOPHAGOGASTRODUODENOSCOPY     ESOPHAGOGASTRODUODENOSCOPY (EGD) WITH PROPOFOL N/A 02/10/2016   Procedure: ESOPHAGOGASTRODUODENOSCOPY (EGD) WITH PROPOFOL;  Surgeon: Lucilla Lame, MD;  Location: [place];  Service: Endoscopy;  Laterality: N/A;  sleep apnea   LIPOMA EXCISION     POLYPECTOMY  02/10/2016   Procedure: POLYPECTOMY;  Surgeon: Lucilla Lame, MD;  Location: [place];  Service: Endoscopy;;   TONSILECTOMY, ADENOIDECTOMY, BILATERAL MYRINGOTOMY AND TUBES      Family History  Problem Relation Age of Onset   Hypertension Mother    Arthritis Mother    Cancer Mother        ovarian,breast, lung, liver   Breast cancer Mother 59   Ovarian cancer Mother 47   Lung cancer Mother 12   Colon cancer Brother    Other Father        lung problems   Ovarian cancer Maternal Grandmother        pt thinks is was ovarian but not sure   Heart disease Brother    Cancer Maternal Grandfather     Social History   Tobacco Use   Smoking status: Never   Smokeless tobacco: Never  Substance Use Topics   Alcohol use: Yes    Alcohol/week: 0.0 standard drinks    Comment: rarely     Current Outpatient Medications:    amphetamine-dextroamphetamine (ADDERALL XR) 15 MG 24 hr capsule, Take 1 capsule by mouth every morning., Disp: 30 capsule, Rfl: 0   ARIPiprazole (ABILIFY) 2 MG tablet, Take by mouth., Disp: , Rfl:    baclofen (LIORESAL) 10 MG tablet, Take 1 tablet (10 mg  total) by mouth at bedtime., Disp: 90 each, Rfl: 0   buPROPion (WELLBUTRIN XL) 150 MG 24 hr tablet, Every morning, Disp: 90 tablet, Rfl: 1   Ca Phosphate-Cholecalciferol 200-200 MG-UNIT CHEW, Chew 1 each by mouth daily., Disp: , Rfl:    citalopram (CELEXA) 20 MG tablet, TAKE 1 TABLET BY MOUTH DAILY., Disp: 90 tablet, Rfl: 1   levothyroxine (SYNTHROID) 200 MCG tablet, TAKE 1 TABLET(200 MCG TOTAL) BY MOUTH DAILY BEFORE BREAKFAST. FROM MONDAY THRU FRIDAY, SKIP WEEKENDS., Disp: 64 tablet, Rfl: 0   meloxicam (MOBIC) 15 MG tablet, TAKE 1 TABLET(15 MG) BY MOUTH DAILY AS NEEDED FOR PAIN, Disp: 90 tablet, Rfl: 0   omeprazole (PRILOSEC) 40 MG capsule, Take 40 mg by mouth in the  morning and at bedtime., Disp: , Rfl:    sucralfate (CARAFATE) 1 g tablet, Take 1 tablet by mouth in the morning, at noon, in the evening, and at bedtime., Disp: , Rfl:    valACYclovir (VALTREX) 1000 MG tablet, TAKE 1 TABLET(1000 MG) BY MOUTH TWICE DAILY, Disp: 30 tablet, Rfl: 0  No Known Allergies  I personally reviewed active problem list, medication list, allergies, family history, social history, health maintenance with the patient/caregiver today.   ROS  Constitutional: Negative for fever or weight change.  Respiratory: Negative for cough and shortness of breath.   Cardiovascular: Negative for chest pain or palpitations.  Gastrointestinal: Negative for abdominal pain, no bowel changes.  Musculoskeletal: Negative for gait problem or joint swelling.  Skin: Negative for rash.  Neurological: Negative for dizziness or headache.  No other specific complaints in a complete review of systems (except as listed in HPI above).   Objective  Vitals:   08/03/21 0829  BP: 122/74  Pulse: 84  Resp: 16  SpO2: 100%  Weight: 240 lb (108.9 kg)  Height: '5\' 8"'$  (1.727 m)    Body mass index is 36.49 kg/m.  Physical Exam  Constitutional: Patient appears well-developed and well-nourished. Obese  No distress.  HEENT: head  atraumatic, normocephalic, pupils equal and reactive to light, neck supple, no thyromegaly  Cardiovascular: Normal rate, regular rhythm and normal heart sounds.  No murmur heard. Trace  BLE edema. Pulmonary/Chest: Effort normal and breath sounds normal. No respiratory distress. Abdominal: Soft.  There is no tenderness. Psychiatric: Patient has a normal mood and affect. behavior is normal. Judgment and thought content normal. Difficulty recalling medications, when she has appointments, seems unsure  Recent Results (from the past 2160 hour(s))  Hold Tube- Blood Bank     Status: None   Collection Time: 06/07/21  1:04 PM  Result Value Ref Range   Blood Bank Specimen SAMPLE AVAILABLE FOR TESTING    Sample Expiration      06/10/2021,2359 Performed at [facility], [address] [place], [place] [address]   CBC with Differential     Status: Abnormal   Collection Time: 06/07/21  1:04 PM  Result Value Ref Range   WBC 5.7 4.0 - 10.5 K/uL   RBC 4.48 3.87 - 5.11 MIL/uL   Hemoglobin 11.6 (L) 12.0 - 15.0 g/dL   HCT 37.9 36.0 - 46.0 %   MCV 84.6 80.0 - 100.0 fL   MCH 25.9 (L) 26.0 - 34.0 pg   MCHC 30.6 30.0 - 36.0 g/dL   RDW 16.8 (H) 11.5 - 15.5 %   Platelets 333 150 - 400 K/uL   nRBC 0.0 0.0 - 0.2 %   Neutrophils Relative % 57 %   Neutro Abs 3.2 1.7 - 7.7 K/uL   Lymphocytes Relative 37 %   Lymphs Abs 2.1 0.7 - 4.0 K/uL   Monocytes Relative 5 %   Monocytes Absolute 0.3 0.1 - 1.0 K/uL   Eosinophils Relative 0 %   Eosinophils Absolute 0.0 0.0 - 0.5 K/uL   Basophils Relative 1 %   Basophils Absolute 0.1 0.0 - 0.1 K/uL   Immature Granulocytes 0 %   Abs Immature Granulocytes 0.01 0.00 - 0.07 K/uL    Comment: Performed at [facility], [address] [place], [place] [address]    PHQ2/9:    08/03/2021    8:28 AM 05/16/2021    8:53 AM 02/17/2021    7:39 AM 02/14/2021    2:40 PM 11/12/2020  1:02 PM  Depression screen PHQ 2/9  Decreased Interest 0 0 1 1 0  Down, Depressed,  Hopeless 0 0 '1 1 1  '$ PHQ - 2 Score 0 0 '2 2 1  '$ Altered sleeping '3 3 3 3 3  '$ Tired, decreased energy 0 '3 3 3 3  '$ Change in appetite 0 0 '1 1 3  '$ Feeling bad or failure about yourself  0 1 0 0 0  Trouble concentrating 0 3 0 0 0  Moving slowly or fidgety/restless 0 0 0 0 0  Suicidal thoughts 0 0 0 0 0  PHQ-9 Score '3 10 9 9 10    '$ phq 9 is negative   Fall Risk:    08/03/2021    8:28 AM 05/16/2021    8:53 AM 02/17/2021    7:38 AM 02/14/2021    2:40 PM 11/12/2020    1:02 PM  Fall Risk   Falls in the past year? 1 1 0 0 0  Number falls in past yr: 1 0 0 0 0  Injury with Fall? 1 1 0 0 0  Risk for fall due to : No Fall Risks No Fall Risks No Fall Risks No Fall Risks No Fall Risks  Follow up Falls prevention discussed Falls prevention discussed Falls prevention discussed Falls prevention discussed Falls prevention discussed      Functional Status Survey: Is the patient deaf or have difficulty hearing?: Yes Does the patient have difficulty seeing, even when wearing glasses/contacts?: Yes Does the patient have difficulty concentrating, remembering, or making decisions?: Yes Does the patient have difficulty walking or climbing stairs?: No Does the patient have difficulty dressing or bathing?: No Does the patient have difficulty doing errands alone such as visiting a doctor's office or shopping?: No    Assessment & Plan  1. Fibromyalgia syndrome  - gabapentin (NEURONTIN) 100 MG capsule; Take 100 mg by mouth 3 (three) times daily.  2. Morbid obesity ([place])  Discussed with the patient the risk posed by an increased BMI. Discussed importance of portion control, calorie counting and at least 150 minutes of physical activity weekly. Avoid sweet beverages and drink more water. Eat at least 6 servings of fruit and vegetables daily    3. Adult hypothyroidism  - TSH  4. B12 deficiency  - B12 and Folate Panel  5. Family history of Alzheimer's disease  - Ambulatory referral to Neurology  6.  Memory problem  - Ambulatory referral to Neurology  7. Recurrent headache  - Ambulatory referral to Neurology  8. Iron deficiency anemia secondary to inadequate dietary iron intake  Under the care of Dr. Grayland Ormond  9. Dyslipidemia  Not on therapy  10. Gastro-esophageal reflux disease without esophagitis  Stable, off sucralfate now   11. Incontinence of feces, unspecified fecal incontinence type  - Ambulatory referral to Neurology  12. DDD (degenerative disc disease), lumbar   13. Major depression, recurrent, chronic (HCC)   Taking medications

## 2021-08-03 ENCOUNTER — Encounter: Payer: Self-pay | Admitting: Family Medicine

## 2021-08-03 ENCOUNTER — Ambulatory Visit (INDEPENDENT_AMBULATORY_CARE_PROVIDER_SITE_OTHER): Payer: 59 | Admitting: Family Medicine

## 2021-08-03 VITALS — BP 122/74 | HR 84 | Resp 16 | Ht 68.0 in | Wt 240.0 lb

## 2021-08-03 DIAGNOSIS — K219 Gastro-esophageal reflux disease without esophagitis: Secondary | ICD-10-CM

## 2021-08-03 DIAGNOSIS — D508 Other iron deficiency anemias: Secondary | ICD-10-CM

## 2021-08-03 DIAGNOSIS — M797 Fibromyalgia: Secondary | ICD-10-CM | POA: Diagnosis not present

## 2021-08-03 DIAGNOSIS — R159 Full incontinence of feces: Secondary | ICD-10-CM

## 2021-08-03 DIAGNOSIS — E039 Hypothyroidism, unspecified: Secondary | ICD-10-CM | POA: Diagnosis not present

## 2021-08-03 DIAGNOSIS — Z820 Family history of epilepsy and other diseases of the nervous system: Secondary | ICD-10-CM

## 2021-08-03 DIAGNOSIS — M5136 Other intervertebral disc degeneration, lumbar region: Secondary | ICD-10-CM

## 2021-08-03 DIAGNOSIS — R519 Headache, unspecified: Secondary | ICD-10-CM

## 2021-08-03 DIAGNOSIS — R413 Other amnesia: Secondary | ICD-10-CM

## 2021-08-03 DIAGNOSIS — E538 Deficiency of other specified B group vitamins: Secondary | ICD-10-CM

## 2021-08-03 DIAGNOSIS — E785 Hyperlipidemia, unspecified: Secondary | ICD-10-CM

## 2021-08-03 DIAGNOSIS — F339 Major depressive disorder, recurrent, unspecified: Secondary | ICD-10-CM

## 2021-08-03 LAB — B12 AND FOLATE PANEL
Folate: 8 ng/mL
Vitamin B-12: 328 pg/mL (ref 200–1100)

## 2021-08-03 LAB — TSH: TSH: 5.29 mIU/L — ABNORMAL HIGH (ref 0.40–4.50)

## 2021-09-01 ENCOUNTER — Telehealth: Payer: Self-pay | Admitting: Family Medicine

## 2021-09-01 DIAGNOSIS — E039 Hypothyroidism, unspecified: Secondary | ICD-10-CM

## 2021-09-02 ENCOUNTER — Other Ambulatory Visit: Payer: Self-pay | Admitting: Family Medicine

## 2021-09-02 DIAGNOSIS — E039 Hypothyroidism, unspecified: Secondary | ICD-10-CM

## 2021-09-02 MED ORDER — LEVOTHYROXINE SODIUM 200 MCG PO TABS: 200.0000 ug | ORAL_TABLET | Freq: Every day | ORAL | 0 refills | Status: DC

## 2021-09-06 ENCOUNTER — Inpatient Hospital Stay: Payer: 59

## 2021-09-06 ENCOUNTER — Inpatient Hospital Stay: Payer: 59 | Attending: Family Medicine

## 2021-09-06 ENCOUNTER — Inpatient Hospital Stay: Payer: 59 | Admitting: Nurse Practitioner

## 2021-09-20 ENCOUNTER — Other Ambulatory Visit: Payer: Self-pay

## 2021-09-20 DIAGNOSIS — E039 Hypothyroidism, unspecified: Secondary | ICD-10-CM

## 2021-09-23 ENCOUNTER — Other Ambulatory Visit: Payer: Self-pay | Admitting: Family Medicine

## 2021-09-23 LAB — TSH: TSH: 3.24 mIU/L (ref 0.40–4.50)

## 2021-11-02 NOTE — Progress Notes (Unsigned)
Name: Debra Soto   MRN: [id number]    DOB: 1960/07/28   Date:11/03/2021       Progress Note  Subjective  Chief Complaint  Follow Up  HPI  Interstitial Lung Disease: seen by Dr. Brennan Bailey 03/2019 for SOB and abnormal CT chest 02/2019 , showed some granulomatous disease also a nodule, She states SOB only with moderate activity, she also  has a chronic cough. She had a repeat CT and still has nodules, per her new pulmonologist Dr. Rennie Natter the plan is to do a biopsy to see if she has sarcoidosis   Memory changes and family history of Alzheimer's: she needs to write everything down, she has noticed it is getting worse, does not seem to be just her fibromyalgia fog. She is having difficulty even expressing herself during office visits. She states she needs to have direct questions to be able to answer. We referred her to Dr. Manuella Ghazi but seems like her mailbox is full and was not see him yet , advised to call his office to schedule   Positive ANA and myalgi/Sicca : seen Dr. Manuella Ghazi - Rheumatologist at [facility] , but she had lost to follow up for years , but has been back since Fall 2022. CRP has been elevated , she told me again today that she is taking a little blue pill and a medication that starts with a G. The medication is gabapentin ( saw on Dr. Trena Platt note today). She had a rash last year associated with joint aches and Dr. Manuella Ghazi is considering starting her on plaquenil   Hypothyroidism: she is on 200 mcg synthroid one daily and  skipping Sundays, last TSH was at goal. She has been compliant, no dysphagia.    Major Depression: she was has been taking Citalopram and Wellbutrin since 10/2016, she  states her memory is getting worse.  She has ADD and is on Adderal. She used to take Abilify but not sure why stopped. She has fatigue and problems sleeping but is chronic and for other reason. She feels well otherwise, denies feeling depressed . Explained the need of taking all medication to all doctors visits    FMS: she stopped Lyrica because it caused her to get moody. Currently off Gabapentin , forgets to take it three times a day, pain levels has been stable 4/10  Chronic low back pain:she continues to have lower back pain , not on medications - stopped taking gabapentin on her own, she is having stiffness and aching . She has seen GI since and was diagnosed with constipation ,she is no longer soiling    MRI lumbar spine: done 06/05/2021 Degenerative disc and facet disease are most pronounced in the lower lumbar spine resulting in mild foraminal stenosis bilaterally at L5-S1.     Obesity: BMI is over 35 with co-morbidities such as GERD, OSA and dyslipidemia . She had lost 46 lbs last year, but she is trending up again, 5 lbs since last visit Seh states she has been craving sugar over the past month   Hyperlipidemia: she is trying to eat healthier  The 10-year ASCVD risk score (Arnett DK, et al., 2019) is: 3.6%   Values used to calculate the score:     Age: 61 years     Sex: Female     Is Non-Hispanic African American: No     Diabetic: No     Tobacco smoker: No     Systolic Blood Pressure: 440 mmHg     Is BP treated:  No     HDL Cholesterol: 48 mg/dL     Total Cholesterol: 182 mg/dL   GERD and hiatal hernia: under the care of GI, had EGD and colonoscopy  Her appetite is good again, and gaining weight Taking PPI   Patient Active Problem List   Diagnosis Date Noted   Interstitial lung disease ([place]) 05/16/2021   Morbid obesity ([place]) 12/27/2018   Iron deficiency anemia secondary to inadequate dietary iron intake 11/06/2017   Essential (hemorrhagic) thrombocythemia ([place])    Benign neoplasm of descending colon    Heartburn    Gastric polyp    Psoriasis 01/25/2016   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/22/2015   Endometriosis 12/31/2014   Insomnia, persistent 09/29/2014   IBS (irritable bowel syndrome) 09/29/2014   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/2014   Dyslipidemia 09/29/2014   Fibromyalgia  syndrome 09/29/2014   Gastro-esophageal reflux disease without esophagitis 09/29/2014   Herpes labialis 09/29/2014   Adult hypothyroidism 09/29/2014   Family history of malignant neoplasm of breast 09/29/2014   Major depression, recurrent, chronic ([place]) 09/29/2014   Obstructive apnea 09/29/2014   Abnormal presence of protein in urine 09/29/2014   Allergic rhinitis 09/29/2014   Vitamin D deficiency 09/29/2014   Urge incontinence 09/29/2014   Headache, tension-type 09/29/2014   Menopausal symptom 09/29/2014    Past Surgical History:  Procedure Laterality Date   ABDOMINAL HYSTERECTOMY  [id number]   bladder tact     BREAST BIOPSY Right 09/02/2015   US Guided biopsy - Ribbon shaped marker - negative   CHOLECYSTECTOMY  [id number]   COLONOSCOPY     COLONOSCOPY WITH PROPOFOL N/A 02/10/2016   Procedure: COLONOSCOPY WITH PROPOFOL;  Surgeon: Lucilla Lame, MD;  Location: [facility];  Service: Endoscopy;  Laterality: N/A;   ESOPHAGOGASTRODUODENOSCOPY     ESOPHAGOGASTRODUODENOSCOPY (EGD) WITH PROPOFOL N/A 02/10/2016   Procedure: ESOPHAGOGASTRODUODENOSCOPY (EGD) WITH PROPOFOL;  Surgeon: Lucilla Lame, MD;  Location: [place];  Service: Endoscopy;  Laterality: N/A;  sleep apnea   LIPOMA EXCISION     POLYPECTOMY  02/10/2016   Procedure: POLYPECTOMY;  Surgeon: Lucilla Lame, MD;  Location: [facility];  Service: Endoscopy;;   TONSILECTOMY, ADENOIDECTOMY, BILATERAL MYRINGOTOMY AND TUBES      Family History  Problem Relation Age of Onset   Hypertension Mother    Arthritis Mother    Cancer Mother        ovarian,breast, lung, liver   Breast cancer Mother 52   Ovarian cancer Mother 67   Lung cancer Mother 64   Colon cancer Brother    Other Father        lung problems   Ovarian cancer Maternal Grandmother        pt thinks is was ovarian but not sure   Heart disease Brother    Cancer Maternal Grandfather     Social History   Tobacco Use   Smoking status: Never    Smokeless tobacco: Never  Substance Use Topics   Alcohol use: Yes    Alcohol/week: 0.0 standard drinks of alcohol    Comment: rarely     Current Outpatient Medications:    amphetamine-dextroamphetamine (ADDERALL XR) 15 MG 24 hr capsule, Take 1 capsule by mouth every morning., Disp: 30 capsule, Rfl: 0   baclofen (LIORESAL) 10 MG tablet, Take 1 tablet (10 mg total) by mouth at bedtime., Disp: 90 each, Rfl: 0   buPROPion (WELLBUTRIN XL) 150 MG 24 hr tablet, Every morning, Disp: 90 tablet, Rfl: 1   Ca Phosphate-Cholecalciferol 200-200 MG-UNIT CHEW,  Chew 1 each by mouth daily., Disp: , Rfl:    citalopram (CELEXA) 20 MG tablet, TAKE 1 TABLET BY MOUTH DAILY., Disp: 90 tablet, Rfl: 1   levothyroxine (SYNTHROID) 200 MCG tablet, Take 1 tablet (200 mcg total) by mouth daily before breakfast., Disp: 78 tablet, Rfl: 0   omeprazole (PRILOSEC) 40 MG capsule, Take 40 mg by mouth in the morning and at bedtime., Disp: , Rfl:    valACYclovir (VALTREX) 1000 MG tablet, TAKE 1 TABLET(1000 MG) BY MOUTH TWICE DAILY, Disp: 30 tablet, Rfl: 0   gabapentin (NEURONTIN) 100 MG capsule, Take 100 mg by mouth 3 (three) times daily. (Patient not taking: Reported on 11/03/2021), Disp: , Rfl:   No Known Allergies  I personally reviewed active problem list, medication list, allergies, family history, social history, health maintenance with the patient/caregiver today.   ROS  Constitutional: Negative for fever or sigificant weight change.  Respiratory: Negative for cough and shortness of breath.   Cardiovascular: Negative for chest pain or palpitations.  Gastrointestinal: Negative for abdominal pain, no bowel changes.  Musculoskeletal: Negative for gait problem or joint swelling.  Skin: Negative for rash.  Neurological: Negative for dizziness or headache.  No other specific complaints in a complete review of systems (except as listed in HPI above).   Objective  Vitals:   11/03/21 0834  BP: 126/68  Pulse: 95  Resp:  16  SpO2: 99%  Weight: 245 lb (111.1 kg)  Height: '5\' 8"'$  (1.727 m)    Body mass index is 37.25 kg/m.  Physical Exam  Constitutional: Patient appears well-developed and well-nourished. Obese  No distress.  HEENT: head atraumatic, normocephalic, pupils equal and reactive to light, neck supple, throat within normal limits Cardiovascular: Normal rate, regular rhythm and normal heart sounds.  No murmur heard. No BLE edema. Pulmonary/Chest: Effort normal and breath sounds normal. No respiratory distress. Abdominal: Soft.  There is no tenderness. Muscular Skeletal trigger point positive  Psychiatric: Patient has a normal mood and affect. behavior is normal. Judgment and thought content normal.   Recent Results (from the past 2160 hour(s))  TSH     Status: None   Collection Time: 09/22/21  9:49 AM  Result Value Ref Range   TSH 3.24 0.40 - 4.50 mIU/L    PHQ2/9:    11/03/2021    8:33 AM 08/03/2021    8:28 AM 05/16/2021    8:53 AM 02/17/2021    7:39 AM 02/14/2021    2:40 PM  Depression screen PHQ 2/9  Decreased Interest 0 0 0 1 1  Down, Depressed, Hopeless 0 0 0 1 1  PHQ - 2 Score 0 0 0 2 2  Altered sleeping '3 3 3 3 3  '$ Tired, decreased energy 3 0 '3 3 3  '$ Change in appetite 2 0 0 1 1  Feeling bad or failure about yourself  0 0 1 0 0  Trouble concentrating 3 0 3 0 0  Moving slowly or fidgety/restless 0 0 0 0 0  Suicidal thoughts 0 0 0 0 0  PHQ-9 Score '11 3 10 9 9    '$ phq 9 is positive   Fall Risk:    11/03/2021    8:33 AM 08/03/2021    8:28 AM 05/16/2021    8:53 AM 02/17/2021    7:38 AM 02/14/2021    2:40 PM  Fall Risk   Falls in the past year? '1 1 1 '$ 0 0  Number falls in past yr: 0 1 0 0 0  Injury with Fall? 0 1 1 0 0  Risk for fall due to : No Fall Risks No Fall Risks No Fall Risks No Fall Risks No Fall Risks  Follow up Falls prevention discussed Falls prevention discussed Falls prevention discussed Falls prevention discussed Falls prevention discussed      Functional Status  Survey: Is the patient deaf or have difficulty hearing?: Yes Does the patient have difficulty seeing, even when wearing glasses/contacts?: Yes Does the patient have difficulty concentrating, remembering, or making decisions?: Yes Does the patient have difficulty walking or climbing stairs?: No Does the patient have difficulty dressing or bathing?: No Does the patient have difficulty doing errands alone such as visiting a doctor's office or shopping?: No    Assessment & Plan   1. Major depression, recurrent, chronic (HCC)  - buPROPion (WELLBUTRIN XL) 150 MG 24 hr tablet; Every morning  Dispense: 90 tablet; Refill: 1 - citalopram (CELEXA) 20 MG tablet; TAKE 1 TABLET BY MOUTH DAILY.  Dispense: 90 tablet; Refill: 1 - amphetamine-dextroamphetamine (ADDERALL XR) 15 MG 24 hr capsule; Take 1 capsule by mouth every morning.  Dispense: 90 capsule; Refill: 0  2. Morbid obesity ([place])  Discussed with the patient the risk posed by an increased BMI. Discussed importance of portion control, calorie counting and at least 150 minutes of physical activity weekly. Avoid sweet beverages and drink more water. Eat at least 6 servings of fruit and vegetables daily    3. Interstitial lung disease ([place])  Needs to follow up with pulmonologist   4. Fibromyalgia syndrome  - amphetamine-dextroamphetamine (ADDERALL XR) 15 MG 24 hr capsule; Take 1 capsule by mouth every morning.  Dispense: 90 capsule; Refill: 0  5. Adult hypothyroidism  - levothyroxine (SYNTHROID) 200 MCG tablet; Take 1 tablet (200 mcg total) by mouth daily before breakfast.  Dispense: 78 tablet; Refill: 0  6. Need for immunization against influenza  - Flu Vaccine QUAD 6+ mos PF IM (Fluarix Quad PF)  7. B12 deficiency   8. Family history of Alzheimer's disease   9. DDD (degenerative disc disease), lumbar   10. Gastro-esophageal reflux disease without esophagitis    11. Abnormal mammogram of right breast  Reminded her to schedule  the appointment for repeat mammogram

## 2021-11-03 ENCOUNTER — Encounter: Payer: Self-pay | Admitting: Family Medicine

## 2021-11-03 ENCOUNTER — Ambulatory Visit (INDEPENDENT_AMBULATORY_CARE_PROVIDER_SITE_OTHER): Payer: 59 | Admitting: Family Medicine

## 2021-11-03 VITALS — BP 126/68 | HR 95 | Resp 16 | Ht 68.0 in | Wt 245.0 lb

## 2021-11-03 DIAGNOSIS — M797 Fibromyalgia: Secondary | ICD-10-CM

## 2021-11-03 DIAGNOSIS — F339 Major depressive disorder, recurrent, unspecified: Secondary | ICD-10-CM

## 2021-11-03 DIAGNOSIS — Z820 Family history of epilepsy and other diseases of the nervous system: Secondary | ICD-10-CM

## 2021-11-03 DIAGNOSIS — E039 Hypothyroidism, unspecified: Secondary | ICD-10-CM

## 2021-11-03 DIAGNOSIS — R928 Other abnormal and inconclusive findings on diagnostic imaging of breast: Secondary | ICD-10-CM

## 2021-11-03 DIAGNOSIS — J849 Interstitial pulmonary disease, unspecified: Secondary | ICD-10-CM | POA: Diagnosis not present

## 2021-11-03 DIAGNOSIS — K219 Gastro-esophageal reflux disease without esophagitis: Secondary | ICD-10-CM

## 2021-11-03 DIAGNOSIS — M5136 Other intervertebral disc degeneration, lumbar region: Secondary | ICD-10-CM

## 2021-11-03 DIAGNOSIS — Z23 Encounter for immunization: Secondary | ICD-10-CM | POA: Diagnosis not present

## 2021-11-03 DIAGNOSIS — E538 Deficiency of other specified B group vitamins: Secondary | ICD-10-CM

## 2021-11-03 MED ORDER — AMPHETAMINE-DEXTROAMPHET ER 15 MG PO CP24: 15.0000 mg | ORAL_CAPSULE | ORAL | 0 refills | Status: DC

## 2021-11-03 MED ORDER — BUPROPION HCL ER (XL) 150 MG PO TB24: ORAL_TABLET | ORAL | 1 refills | Status: DC

## 2021-11-03 MED ORDER — CITALOPRAM HYDROBROMIDE 20 MG PO TABS: ORAL_TABLET | ORAL | 1 refills | Status: DC

## 2021-11-03 MED ORDER — LEVOTHYROXINE SODIUM 200 MCG PO TABS: 200.0000 ug | ORAL_TABLET | Freq: Every day | ORAL | 0 refills | Status: DC

## 2021-11-03 NOTE — Patient Instructions (Addendum)
You need to call Rheumatologist, oncologist and also need to contact Dr. Jennings Books - neurologist at [facility]

## 2021-11-04 ENCOUNTER — Telehealth: Payer: Self-pay | Admitting: Family Medicine

## 2021-11-04 ENCOUNTER — Ambulatory Visit: Payer: Self-pay

## 2021-11-04 NOTE — Telephone Encounter (Signed)
Called and clarified she should take '150mg'$  daily in the morning.

## 2021-11-04 NOTE — Telephone Encounter (Signed)
A user error has taken place: encounter opened in error, closed for administrative reasons.

## 2021-11-04 NOTE — Telephone Encounter (Signed)
Chief Complaint: Wellbutrin needs how to take Symptoms: N/A Frequency: N/A Pertinent Negatives: Patient denies N/A Disposition: '[]'$ ED /'[]'$ Urgent Care (no appt availability in office) / '[]'$ Appointment(In office/virtual)/ '[]'$  [facility]/ '[]'$ Home Care/ '[]'$ Refused Recommended Disposition /'[]'$ [place] [facility]/ '[x]'$  Follow-up with PCP Additional Notes: Mitzi Hansen, Education administrator at [employer] called and he says the Wellbutrin received doesn't have how often to take, just says take every morning. Will need to know how many to take every morning. Advised not noted in the OV note, so I will send this to Dr. Ancil Boozer and a new Rx will be sent.     Summary: medication clarification   Pharmacy needing clarification on direction for buPROPion (WELLBUTRIN XL) 150 MG 24 hr tablet   Please assist further      Reason for Disposition  [1] Pharmacy calling with prescription question AND [2] triager unable to answer question  Answer Assessment - Initial Assessment Questions 1. NAME of MEDICINE: "What medicine(s) are you calling about?"     Wellbutrin 2. QUESTION: "What is your question?" (e.g., double dose of medicine, side effect)     Will need clarification on how much to take every morning 3. PRESCRIBER: "Who prescribed the medicine?" Reason: if prescribed by specialist, call should be referred to that group.     Dr. Ancil Boozer 4. SYMPTOMS: "Do you have any symptoms?" If Yes, ask: "What symptoms are you having?"  "How bad are the symptoms (e.g., mild, moderate, severe)     N/A 5. PREGNANCY:  "Is there any chance that you are pregnant?" "When was your last menstrual period?"     N/A  Protocols used: Medication Question Call-A-AH

## 2021-11-07 ENCOUNTER — Other Ambulatory Visit: Payer: Self-pay | Admitting: Family Medicine

## 2021-11-07 DIAGNOSIS — E039 Hypothyroidism, unspecified: Secondary | ICD-10-CM

## 2021-12-19 ENCOUNTER — Other Ambulatory Visit: Payer: Self-pay | Admitting: Family Medicine

## 2021-12-19 DIAGNOSIS — F339 Major depressive disorder, recurrent, unspecified: Secondary | ICD-10-CM

## 2021-12-26 ENCOUNTER — Other Ambulatory Visit: Payer: Self-pay | Admitting: Family Medicine

## 2021-12-26 DIAGNOSIS — M797 Fibromyalgia: Secondary | ICD-10-CM

## 2021-12-26 DIAGNOSIS — F339 Major depressive disorder, recurrent, unspecified: Secondary | ICD-10-CM

## 2021-12-26 NOTE — Telephone Encounter (Signed)
Medication Refill - Medication: amphetamine-dextroamphetamine (ADDERALL XR) 15 MG 24 hr capsule  Has the patient contacted their pharmacy? Yes.   Pt told to contact provider  Preferred Pharmacy (with phone number or street name):  [facility]ORE [place], [address] AT [address] [place] Phone:  [phone]  Fax:  [phone]     Has the patient been seen for an appointment in the last year OR does the patient have an upcoming appointment? Yes.    Agent: Please be advised that RX refills may take up to 3 business days. We ask that you follow-up with your pharmacy.

## 2021-12-27 NOTE — Telephone Encounter (Signed)
Requested medication (s) are due for refill today: No  Requested medication (s) are on the active medication list: yes    Last refill: 11/03/21  #90  0 refills  Future visit scheduled yes 01/31/22  Notes to clinic:Not delegated, please review.  Requested Prescriptions  Pending Prescriptions Disp Refills   amphetamine-dextroamphetamine (ADDERALL XR) 15 MG 24 hr capsule 90 capsule 0    Sig: Take 1 capsule by mouth every morning.     Not Delegated - Psychiatry:  Stimulants/ADHD Failed - 12/26/2021 12:51 PM      Failed - This refill cannot be delegated      Failed - Urine Drug Screen completed in last 360 days      Passed - Last BP in normal range    BP Readings from Last 1 Encounters:  11/03/21 126/68         Passed - Last Heart Rate in normal range    Pulse Readings from Last 1 Encounters:  11/03/21 95         Passed - Valid encounter within last 6 months    Recent Outpatient Visits           1 month ago Major depression, recurrent, chronic ([place])   [facility] Steele Sizer, MD   4 months ago Memory problem   [facility] Steele Sizer, MD   7 months ago Interstitial lung disease [facility])   [facility] [place], Drue Stager, MD   10 months ago Iron deficiency anemia, unspecified iron deficiency anemia type   [facility] Steele Sizer, MD   10 months ago Interstitial lung disease [facility])   [facility] Steele Sizer, MD       Future Appointments             In 1 month Ancil Boozer, Drue Stager, MD [facility], [facility]

## 2021-12-28 MED ORDER — AMPHETAMINE-DEXTROAMPHET ER 15 MG PO CP24: 15.0000 mg | ORAL_CAPSULE | ORAL | 0 refills | Status: DC

## 2022-01-26 ENCOUNTER — Other Ambulatory Visit: Payer: Self-pay | Admitting: Family Medicine

## 2022-01-26 NOTE — Telephone Encounter (Signed)
Requested medication (s) are due for refill today -yes  Requested medication (s) are on the active medication list - yes  Future visit scheduled -yes  Last refill: 03/21/21  Notes to clinic: Rx listed as historical- sent for provider review   Requested Prescriptions  Pending Prescriptions Disp Refills   omeprazole (PRILOSEC) 40 MG capsule      Sig: Take 1 capsule (40 mg total) by mouth in the morning and at bedtime.     Gastroenterology: Proton Pump Inhibitors Passed - 01/26/2022 11:26 AM      Passed - Valid encounter within last 12 months    Recent Outpatient Visits           2 months ago Major depression, recurrent, chronic ([place])   [facility] Steele Sizer, MD   5 months ago Memory problem   [facility] Steele Sizer, MD   8 months ago Interstitial lung disease [facility])   [facility] [place], Drue Stager, MD   11 months ago Iron deficiency anemia, unspecified iron deficiency anemia type   [facility] Steele Sizer, MD   11 months ago Interstitial lung disease [facility])   [facility] Steele Sizer, MD       Future Appointments             In 5 days Steele Sizer, MD [facility], [facility]               Requested Prescriptions  Pending Prescriptions Disp Refills   omeprazole (PRILOSEC) 40 MG capsule      Sig: Take 1 capsule (40 mg total) by mouth in the morning and at bedtime.     Gastroenterology: Proton Pump Inhibitors Passed - 01/26/2022 11:26 AM      Passed - Valid encounter within last 12 months    Recent Outpatient Visits           2 months ago Major depression, recurrent, chronic [facility])   [facility] Steele Sizer, MD   5 months ago Memory problem   [facility] Steele Sizer, MD   8 months ago Interstitial lung disease [facility])   [facility] Steele Sizer, MD   11 months ago  Iron deficiency anemia, unspecified iron deficiency anemia type   [facility] Steele Sizer, MD   11 months ago Interstitial lung disease [facility])   [facility] Steele Sizer, MD       Future Appointments             In 5 days Steele Sizer, MD [facility], [facility]

## 2022-01-26 NOTE — Telephone Encounter (Addendum)
Medication Refill - Medication: omeprazole (PRILOSEC) 40 MG capsule   Has the patient contacted their pharmacy? Yes.     Preferred Pharmacy (with phone number or street name):  [facility] [place], [address] [place] Phone: [phone]  Fax: [phone]     Has the patient been seen for an appointment in the last year OR does the patient have an upcoming appointment? Yes.     The patient has requested to get a quantity of #60 as that is what she has taken in the past when she was taking 1 twice a day. She recently went to #30 with only 1 pill a day but has noticed that isn't helping enough and so she wants to go back to #60. The patient also states she tried over the counter Prilosec but that hasn't helped at all and she is completely out of her meds. Please assist patient further

## 2022-01-27 ENCOUNTER — Other Ambulatory Visit: Payer: Self-pay | Admitting: Family Medicine

## 2022-01-27 DIAGNOSIS — K219 Gastro-esophageal reflux disease without esophagitis: Secondary | ICD-10-CM

## 2022-01-27 MED ORDER — OMEPRAZOLE 40 MG PO CPDR: 40.0000 mg | DELAYED_RELEASE_CAPSULE | Freq: Two times a day (BID) | ORAL | 0 refills | Status: DC

## 2022-01-30 NOTE — Progress Notes (Deleted)
Name: Debra Soto   MRN: [id number]    DOB: May 07, 1960   Date:01/30/2022       Progress Note  Subjective  Chief Complaint  Follow Up  HPI  Interstitial Lung Disease: seen by Dr. Brennan Bailey 03/2019 for SOB and abnormal CT chest 02/2019 , showed some granulomatous disease also a nodule, She states SOB only with moderate activity, she also  has a chronic cough. She had a repeat CT and still has nodules, per her new pulmonologist Dr. Rennie Natter the plan is to do a biopsy to see if she has sarcoidosis   Memory changes and family history of Alzheimer's: she needs to write everything down, she has noticed it is getting worse, does not seem to be just her fibromyalgia fog. She is having difficulty even expressing herself during office visits. She states she needs to have direct questions to be able to answer. We referred her to Dr. Manuella Ghazi but seems like her mailbox is full and was not see him yet , advised to call his office to schedule   Positive ANA and myalgi/Sicca : seen Dr. Manuella Ghazi - Rheumatologist at [facility] , but she had lost to follow up for years , but has been back since Fall 2022. CRP has been elevated , she told me again today that she is taking a little blue pill and a medication that starts with a G. The medication is gabapentin ( saw on Dr. Trena Platt note today). She had a rash last year associated with joint aches and Dr. Manuella Ghazi is considering starting her on plaquenil   Hypothyroidism: she is on 200 mcg synthroid one daily and  skipping Sundays, last TSH was at goal. She has been compliant, no dysphagia.    Major Depression: she was has been taking Citalopram and Wellbutrin since 10/2016, she  states her memory is getting worse.  She has ADD and is on Adderal. She used to take Abilify but not sure why stopped. She has fatigue and problems sleeping but is chronic and for other reason. She feels well otherwise, denies feeling depressed . Explained the need of taking all medication to all doctors visits    FMS: she stopped Lyrica because it caused her to get moody. Currently off Gabapentin , forgets to take it three times a day, pain levels has been stable 4/10  Chronic low back pain:she continues to have lower back pain , not on medications - stopped taking gabapentin on her own, she is having stiffness and aching . She has seen GI since and was diagnosed with constipation ,she is no longer soiling    MRI lumbar spine: done 06/05/2021 Degenerative disc and facet disease are most pronounced in the lower lumbar spine resulting in mild foraminal stenosis bilaterally at L5-S1.     Obesity: BMI is over 35 with co-morbidities such as GERD, OSA and dyslipidemia . She had lost 46 lbs last year, but she is trending up again, 5 lbs since last visit Seh states she has been craving sugar over the past month   Hyperlipidemia: she is trying to eat healthier  The 10-year ASCVD risk score (Arnett DK, et al., 2019) is: 3.6%   Values used to calculate the score:     Age: 51 years     Sex: Female     Is Non-Hispanic African American: No     Diabetic: No     Tobacco smoker: No     Systolic Blood Pressure: 440 mmHg     Is BP treated:  No     HDL Cholesterol: 48 mg/dL     Total Cholesterol: 182 mg/dL   GERD and hiatal hernia: under the care of GI, had EGD and colonoscopy  Her appetite is good again, and gaining weight Taking PPI   Patient Active Problem List   Diagnosis Date Noted   Interstitial lung disease ([place]) 05/16/2021   Morbid obesity ([place]) 12/27/2018   Iron deficiency anemia secondary to inadequate dietary iron intake 11/06/2017   Essential (hemorrhagic) thrombocythemia ([place])    Benign neoplasm of descending colon    Heartburn    Gastric polyp    Psoriasis 01/25/2016   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/22/2015   Endometriosis 12/31/2014   Insomnia, persistent 09/29/2014   IBS (irritable bowel syndrome) 09/29/2014   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/2014   Dyslipidemia 09/29/2014   Fibromyalgia  syndrome 09/29/2014   Gastro-esophageal reflux disease without esophagitis 09/29/2014   Herpes labialis 09/29/2014   Adult hypothyroidism 09/29/2014   Family history of malignant neoplasm of breast 09/29/2014   Major depression, recurrent, chronic ([place]) 09/29/2014   Obstructive apnea 09/29/2014   Abnormal presence of protein in urine 09/29/2014   Allergic rhinitis 09/29/2014   Vitamin D deficiency 09/29/2014   Urge incontinence 09/29/2014   Headache, tension-type 09/29/2014   Menopausal symptom 09/29/2014    Past Surgical History:  Procedure Laterality Date   ABDOMINAL HYSTERECTOMY  [id number]   bladder tact     BREAST BIOPSY Right 09/02/2015   US Guided biopsy - Ribbon shaped marker - negative   CHOLECYSTECTOMY  [id number]   COLONOSCOPY     COLONOSCOPY WITH PROPOFOL N/A 02/10/2016   Procedure: COLONOSCOPY WITH PROPOFOL;  Surgeon: Lucilla Lame, MD;  Location: [facility];  Service: Endoscopy;  Laterality: N/A;   ESOPHAGOGASTRODUODENOSCOPY     ESOPHAGOGASTRODUODENOSCOPY (EGD) WITH PROPOFOL N/A 02/10/2016   Procedure: ESOPHAGOGASTRODUODENOSCOPY (EGD) WITH PROPOFOL;  Surgeon: Lucilla Lame, MD;  Location: [facility];  Service: Endoscopy;  Laterality: N/A;  sleep apnea   LIPOMA EXCISION     POLYPECTOMY  02/10/2016   Procedure: POLYPECTOMY;  Surgeon: Lucilla Lame, MD;  Location: [facility];  Service: Endoscopy;;   TONSILECTOMY, ADENOIDECTOMY, BILATERAL MYRINGOTOMY AND TUBES      Family History  Problem Relation Age of Onset   Hypertension Mother    Arthritis Mother    Cancer Mother        ovarian,breast, lung, liver   Breast cancer Mother 52   Ovarian cancer Mother 67   Lung cancer Mother 64   Colon cancer Brother    Other Father        lung problems   Ovarian cancer Maternal Grandmother        pt thinks is was ovarian but not sure   Heart disease Brother    Cancer Maternal Grandfather     Social History   Tobacco Use   Smoking status: Never    Smokeless tobacco: Never  Substance Use Topics   Alcohol use: Yes    Alcohol/week: 0.0 standard drinks of alcohol    Comment: rarely     Current Outpatient Medications:    amphetamine-dextroamphetamine (ADDERALL XR) 15 MG 24 hr capsule, Take 1 capsule by mouth every morning., Disp: 30 capsule, Rfl: 0   baclofen (LIORESAL) 10 MG tablet, Take 1 tablet (10 mg total) by mouth at bedtime., Disp: 90 each, Rfl: 0   buPROPion (WELLBUTRIN XL) 150 MG 24 hr tablet, Every morning, Disp: 90 tablet, Rfl: 1   Ca Phosphate-Cholecalciferol 200-200 MG-UNIT CHEW,  Chew 1 each by mouth daily., Disp: , Rfl:    citalopram (CELEXA) 20 MG tablet, TAKE 1 TABLET BY MOUTH DAILY, Disp: 90 tablet, Rfl: 1   Cyanocobalamin 1000 MCG SUBL, Place 1 each under the tongue daily at 12 noon., Disp: , Rfl:    levothyroxine (SYNTHROID) 200 MCG tablet, Take 1 tablet (200 mcg total) by mouth daily before breakfast., Disp: 78 tablet, Rfl: 0   omeprazole (PRILOSEC) 40 MG capsule, Take 1 capsule (40 mg total) by mouth in the morning and at bedtime., Disp: 90 capsule, Rfl: 0   valACYclovir (VALTREX) 1000 MG tablet, TAKE 1 TABLET(1000 MG) BY MOUTH TWICE DAILY, Disp: 30 tablet, Rfl: 0  No Known Allergies  I personally reviewed active problem list, medication list, allergies, family history, social history, health maintenance with the patient/caregiver today.   ROS  ***  Objective  There were no vitals filed for this visit.  There is no height or weight on file to calculate BMI.  Physical Exam ***  No results found for this or any previous visit (from the past 2160 hour(s)).   PHQ2/9:    11/03/2021    8:33 AM 08/03/2021    8:28 AM 05/16/2021    8:53 AM 02/17/2021    7:39 AM 02/14/2021    2:40 PM  Depression screen PHQ 2/9  Decreased Interest 0 0 0 1 1  Down, Depressed, Hopeless 0 0 0 1 1  PHQ - 2 Score 0 0 0 2 2  Altered sleeping '3 3 3 3 3  '$ Tired, decreased energy 3 0 '3 3 3  '$ Change in appetite 2 0 0 1 1  Feeling bad  or failure about yourself  0 0 1 0 0  Trouble concentrating 3 0 3 0 0  Moving slowly or fidgety/restless 0 0 0 0 0  Suicidal thoughts 0 0 0 0 0  PHQ-9 Score '11 3 10 9 9    '$ phq 9 is {gen pos [id number]}   Fall Risk:    11/03/2021    8:33 AM 08/03/2021    8:28 AM 05/16/2021    8:53 AM 02/17/2021    7:38 AM 02/14/2021    2:40 PM  Fall Risk   Falls in the past year? '1 1 1 '$ 0 0  Number falls in past yr: 0 1 0 0 0  Injury with Fall? 0 1 1 0 0  Risk for fall due to : No Fall Risks No Fall Risks No Fall Risks No Fall Risks No Fall Risks  Follow up Falls prevention discussed Falls prevention discussed Falls prevention discussed Falls prevention discussed Falls prevention discussed      Functional Status Survey:      Assessment & Plan  *** There are no diagnoses linked to this encounter.

## 2022-01-31 ENCOUNTER — Ambulatory Visit: Payer: 59 | Admitting: Family Medicine

## 2022-04-21 ENCOUNTER — Other Ambulatory Visit: Payer: Self-pay | Admitting: Family Medicine

## 2022-04-21 ENCOUNTER — Encounter: Payer: Self-pay | Admitting: Family Medicine

## 2022-04-21 ENCOUNTER — Ambulatory Visit (INDEPENDENT_AMBULATORY_CARE_PROVIDER_SITE_OTHER): Payer: 59 | Admitting: Family Medicine

## 2022-04-21 ENCOUNTER — Telehealth: Payer: Self-pay | Admitting: Family Medicine

## 2022-04-21 ENCOUNTER — Ambulatory Visit: Payer: Self-pay

## 2022-04-21 ENCOUNTER — Other Ambulatory Visit
Admission: RE | Admit: 2022-04-21 | Discharge: 2022-04-21 | Disposition: A | Discharge status: Home / Self Care | Payer: 59 | Source: Home / Self Care | Attending: Family Medicine | Admitting: Family Medicine

## 2022-04-21 ENCOUNTER — Ambulatory Visit
Admission: RE | Admit: 2022-04-21 | Discharge: 2022-04-21 | Disposition: A | Discharge status: Home / Self Care | Payer: 59 | Source: Ambulatory Visit | Attending: Family Medicine | Admitting: Family Medicine

## 2022-04-21 VITALS — BP 126/78 | HR 94 | Temp 98.0°F | Resp 16 | Ht 68.0 in | Wt 247.0 lb

## 2022-04-21 DIAGNOSIS — M797 Fibromyalgia: Secondary | ICD-10-CM | POA: Diagnosis not present

## 2022-04-21 DIAGNOSIS — R1031 Right lower quadrant pain: Secondary | ICD-10-CM

## 2022-04-21 DIAGNOSIS — E039 Hypothyroidism, unspecified: Secondary | ICD-10-CM

## 2022-04-21 DIAGNOSIS — M3500 Sicca syndrome, unspecified: Secondary | ICD-10-CM | POA: Insufficient documentation

## 2022-04-21 LAB — COMPREHENSIVE METABOLIC PANEL
ALT: 17 U/L (ref 0–44)
AST: 21 U/L (ref 15–41)
Albumin: 4 g/dL (ref 3.5–5.0)
Alkaline Phosphatase: 121 U/L (ref 38–126)
Anion gap: 9 (ref 5–15)
BUN: 23 mg/dL (ref 8–23)
CO2: 27 mmol/L (ref 22–32)
Calcium: 9.4 mg/dL (ref 8.9–10.3)
Chloride: 95 mmol/L — ABNORMAL LOW (ref 98–111)
Creatinine, Ser: 1.16 mg/dL — ABNORMAL HIGH (ref 0.44–1.00)
GFR, Estimated: 54 mL/min — ABNORMAL LOW (ref >60)
Glucose, Bld: 100 mg/dL — ABNORMAL HIGH (ref 70–99)
Potassium: 4.6 mmol/L (ref 3.5–5.1)
Sodium: 131 mmol/L — ABNORMAL LOW (ref 135–145)
Total Bilirubin: 0.6 mg/dL (ref 0.3–1.2)
Total Protein: 8.9 g/dL — ABNORMAL HIGH (ref 6.5–8.1)

## 2022-04-21 LAB — CBC WITH DIFFERENTIAL/PLATELET
Abs Immature Granulocytes: 0.06 10*3/uL (ref 0.00–0.07)
Basophils Absolute: 0.1 10*3/uL (ref 0.0–0.1)
Basophils Relative: 1 %
Eosinophils Absolute: 0.1 10*3/uL (ref 0.0–0.5)
Eosinophils Relative: 0 %
HCT: 36 % (ref 36.0–46.0)
Hemoglobin: 10.5 g/dL — ABNORMAL LOW (ref 12.0–15.0)
Immature Granulocytes: 1 %
Lymphocytes Relative: 30 %
Lymphs Abs: 3.3 10*3/uL (ref 0.7–4.0)
MCH: 21.7 pg — ABNORMAL LOW (ref 26.0–34.0)
MCHC: 29.2 g/dL — ABNORMAL LOW (ref 30.0–36.0)
MCV: 74.4 fL — ABNORMAL LOW (ref 80.0–100.0)
Monocytes Absolute: 0.6 10*3/uL (ref 0.1–1.0)
Monocytes Relative: 6 %
Neutro Abs: 7 10*3/uL (ref 1.7–7.7)
Neutrophils Relative %: 62 %
Platelets: 538 10*3/uL — ABNORMAL HIGH (ref 150–400)
RBC: 4.84 MIL/uL (ref 3.87–5.11)
RDW: 15.7 % — ABNORMAL HIGH (ref 11.5–15.5)
WBC: 11.2 10*3/uL — ABNORMAL HIGH (ref 4.0–10.5)
nRBC: 0 % (ref 0.0–0.2)

## 2022-04-21 MED ORDER — OMEPRAZOLE 40 MG PO CPDR: 40.0000 mg | DELAYED_RELEASE_CAPSULE | Freq: Two times a day (BID) | ORAL | 1 refills | Status: DC

## 2022-04-21 MED ORDER — AMPHETAMINE-DEXTROAMPHET ER 15 MG PO CP24: 15.0000 mg | ORAL_CAPSULE | ORAL | 0 refills | Status: DC

## 2022-04-21 MED ORDER — LEVOTHYROXINE SODIUM 200 MCG PO TABS: 200.0000 ug | ORAL_TABLET | Freq: Every day | ORAL | 1 refills | Status: DC

## 2022-04-21 NOTE — Telephone Encounter (Signed)
Copied from [place]. Topic: General - Other >> Apr 21, 2022  1:37 PM Leone Payor F wrote: Reason for CRM: Patient called in and spoke with a nurse and was sent to the hospital due to worries about patient's appendix. Patient had a CT done while at the hospital and was told patient's provider would follow up with her. Patient is calling to follow up about those results from the CT. Please follow up with patient.

## 2022-04-21 NOTE — Telephone Encounter (Signed)
Called patient and let her know we are still awaiting results and will call her as soon as we get them.

## 2022-04-21 NOTE — Progress Notes (Signed)
Name: Debra Soto   MRN: [id number]    DOB: 02-Jun-1960   Date:04/21/2022       Progress Note  Subjective  Chief Complaint  Medication Refill/ Abdominal Pain  HPI  Abdominal pain: she noticed lack of appetite and abdominal pain on right lower side since yesterday, she only ate a few wafer cookies and vomited shortly after and again when she tried to eat last nigh. She has a history of constipation but had one bowel movement yesterday and had loose stools today . She is nauseated and has a lack of appetite. History of cholecystectomy but still has her appendix. No fever or chills but does not feel well   She missed her last appointment and needs refills of her regular medications to last until her next scheduled visit   She is due for follow up with sub-specialists   Patient Active Problem List   Diagnosis Date Noted   Sicca, unspecified type ([place]) 04/21/2022   Interstitial lung disease ([place]) 05/16/2021   Morbid obesity ([place]) 12/27/2018   Iron deficiency anemia secondary to inadequate dietary iron intake 11/06/2017   Essential (hemorrhagic) thrombocythemia ([place])    Benign neoplasm of descending colon    Heartburn    Gastric polyp    Psoriasis 01/25/2016   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/22/2015   Endometriosis 12/31/2014   Insomnia, persistent 09/29/2014   IBS (irritable bowel syndrome) 09/29/2014   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/2014   Dyslipidemia 09/29/2014   Fibromyalgia syndrome 09/29/2014   Gastro-esophageal reflux disease without esophagitis 09/29/2014   Herpes labialis 09/29/2014   Adult hypothyroidism 09/29/2014   Family history of malignant neoplasm of breast 09/29/2014   Major depression, recurrent, chronic ([place]) 09/29/2014   Obstructive apnea 09/29/2014   Abnormal presence of protein in urine 09/29/2014   Allergic rhinitis 09/29/2014   Vitamin D deficiency 09/29/2014   Urge incontinence 09/29/2014   Headache, tension-type 09/29/2014   Menopausal symptom  09/29/2014    Past Surgical History:  Procedure Laterality Date   ABDOMINAL HYSTERECTOMY  [id number]   bladder tact     BREAST BIOPSY Right 09/02/2015   US Guided biopsy - Ribbon shaped marker - negative   CHOLECYSTECTOMY  [id number]   COLONOSCOPY     COLONOSCOPY WITH PROPOFOL N/A 02/10/2016   Procedure: COLONOSCOPY WITH PROPOFOL;  Surgeon: Lucilla Lame, MD;  Location: [facility];  Service: Endoscopy;  Laterality: N/A;   ESOPHAGOGASTRODUODENOSCOPY     ESOPHAGOGASTRODUODENOSCOPY (EGD) WITH PROPOFOL N/A 02/10/2016   Procedure: ESOPHAGOGASTRODUODENOSCOPY (EGD) WITH PROPOFOL;  Surgeon: Lucilla Lame, MD;  Location: [facility];  Service: Endoscopy;  Laterality: N/A;  sleep apnea   LIPOMA EXCISION     POLYPECTOMY  02/10/2016   Procedure: POLYPECTOMY;  Surgeon: Lucilla Lame, MD;  Location: [facility];  Service: Endoscopy;;   TONSILECTOMY, ADENOIDECTOMY, BILATERAL MYRINGOTOMY AND TUBES      Family History  Problem Relation Age of Onset   Hypertension Mother    Arthritis Mother    Cancer Mother        ovarian,breast, lung, liver   Breast cancer Mother 22   Ovarian cancer Mother 29   Lung cancer Mother 24   Colon cancer Brother    Other Father        lung problems   Ovarian cancer Maternal Grandmother        pt thinks is was ovarian but not sure   Heart disease Brother    Cancer Maternal Grandfather     Social History   Tobacco  Use   Smoking status: Never   Smokeless tobacco: Never  Substance Use Topics   Alcohol use: Yes    Alcohol/week: 0.0 standard drinks of alcohol    Comment: rarely     Current Outpatient Medications:    baclofen (LIORESAL) 10 MG tablet, Take 1 tablet (10 mg total) by mouth at bedtime., Disp: 90 each, Rfl: 0   buPROPion (WELLBUTRIN XL) 150 MG 24 hr tablet, Every morning, Disp: 90 tablet, Rfl: 1   Ca Phosphate-Cholecalciferol 200-200 MG-UNIT CHEW, Chew 1 each by mouth daily., Disp: , Rfl:    citalopram (CELEXA) 20 MG tablet, TAKE 1  TABLET BY MOUTH DAILY, Disp: 90 tablet, Rfl: 1   Cyanocobalamin 1000 MCG SUBL, Place 1 each under the tongue daily at 12 noon., Disp: , Rfl:    levothyroxine (SYNTHROID) 200 MCG tablet, Take 1 tablet (200 mcg total) by mouth daily before breakfast., Disp: 90 tablet, Rfl: 1   omeprazole (PRILOSEC) 40 MG capsule, Take 1 capsule (40 mg total) by mouth in the morning and at bedtime., Disp: 180 capsule, Rfl: 1   valACYclovir (VALTREX) 1000 MG tablet, TAKE 1 TABLET(1000 MG) BY MOUTH TWICE DAILY, Disp: 30 tablet, Rfl: 0   amphetamine-dextroamphetamine (ADDERALL XR) 15 MG 24 hr capsule, Take 1 capsule by mouth every morning., Disp: 30 capsule, Rfl: 0  No Known Allergies  I personally reviewed active problem list, medication list, allergies, family history, social history, health maintenance with the patient/caregiver today.   ROS  Ten systems reviewed and is negative except as mentioned in HPI   Objective  Vitals:   04/21/22 1145  BP: 126/78  Pulse: 94  Resp: 16  Temp: 98 F (36.7 C)  TempSrc: Oral  SpO2: 100%  Weight: 247 lb (112 kg)  Height: 5' 8"$  (1.727 m)    Body mass index is 37.56 kg/m.  Physical Exam  Constitutional: Patient appears well-developed and well-nourished. Obese  No distress.  HEENT: head atraumatic, normocephalic, pupils equal and reactive to light, neck supple Cardiovascular: Normal rate, regular rhythm and normal heart sounds.  No murmur heard. No BLE edema. Pulmonary/Chest: Effort normal and breath sounds normal. No respiratory distress. Abdominal: Soft.  There is RLQ  tenderness with voluntary guarding but no rebound tenderness, mild discomfort on LLQ also. Normal bowel sounds . Psychiatric: Patient has a normal mood and affect. behavior is normal. Judgment and thought content normal.    PHQ2/9:    04/21/2022   11:47 AM 11/03/2021    8:33 AM 08/03/2021    8:28 AM 05/16/2021    8:53 AM 02/17/2021    7:39 AM  Depression screen PHQ 2/9  Decreased Interest 0 0  0 0 1  Down, Depressed, Hopeless 0 0 0 0 1  PHQ - 2 Score 0 0 0 0 2  Altered sleeping 0 3 3 3 3  $ Tired, decreased energy 0 3 0 3 3  Change in appetite 0 2 0 0 1  Feeling bad or failure about yourself  0 0 0 1 0  Trouble concentrating 0 3 0 3 0  Moving slowly or fidgety/restless 0 0 0 0 0  Suicidal thoughts 0 0 0 0 0  PHQ-9 Score 0 11 3 10 9    $ phq 9 is negative   Fall Risk:    04/21/2022   11:47 AM 11/03/2021    8:33 AM 08/03/2021    8:28 AM 05/16/2021    8:53 AM 02/17/2021    7:38 AM  Fall Risk  Falls in the past year? 0 1 1 1 $ 0  Number falls in past yr:  0 1 0 0  Injury with Fall?  0 1 1 0  Risk for fall due to : No Fall Risks No Fall Risks No Fall Risks No Fall Risks No Fall Risks  Follow up Falls prevention discussed Falls prevention discussed Falls prevention discussed Falls prevention discussed Falls prevention discussed      Functional Status Survey: Is the patient deaf or have difficulty hearing?: No Does the patient have difficulty seeing, even when wearing glasses/contacts?: No Does the patient have difficulty concentrating, remembering, or making decisions?: No Does the patient have difficulty walking or climbing stairs?: No Does the patient have difficulty dressing or bathing?: No Does the patient have difficulty doing errands alone such as visiting a doctor's office or shopping?: No    Assessment & Plan  1. Right lower quadrant abdominal pain  - CT Abdomen Pelvis Wo Contrast; Future - CBC with Differential/Platelet; Future - Comprehensive Metabolic Panel (CMET); Future  2. Adult hypothyroidism  Resume medication and return in 6 weeks for follow up  3. Fibromyalgia syndrome  - amphetamine-dextroamphetamine (ADDERALL XR) 15 MG 24 hr capsule; Take 1 capsule by mouth every morning.  Dispense: 30 capsule; Refill: 0

## 2022-04-21 NOTE — Telephone Encounter (Signed)
Requested Prescriptions  Pending Prescriptions Disp Refills   levothyroxine (SYNTHROID) 200 MCG tablet 90 tablet 1    Sig: Take 1 tablet (200 mcg total) by mouth daily before breakfast.     Endocrinology:  Hypothyroid Agents Passed - 04/21/2022 10:15 AM      Passed - TSH in normal range and within 360 days    TSH  Date Value Ref Range Status  09/22/2021 3.24 0.40 - 4.50 mIU/L Final         Passed - Valid encounter within last 12 months    Recent Outpatient Visits           5 months ago Major depression, recurrent, chronic ([place])   [facility] Steele Sizer, MD   8 months ago Memory problem   [facility] Steele Sizer, MD   11 months ago Interstitial lung disease [facility])   [facility], Drue Stager, MD   1 year ago Iron deficiency anemia, unspecified iron deficiency anemia type   [facility] Steele Sizer, MD   1 year ago Interstitial lung disease [facility])   [facility], Drue Stager, MD       Future Appointments             Today Steele Sizer, MD [facility]             omeprazole (PRILOSEC) 40 MG capsule 180 capsule 1    Sig: Take 1 capsule (40 mg total) by mouth in the morning and at bedtime.     Gastroenterology: Proton Pump Inhibitors Passed - 04/21/2022 10:15 AM      Passed - Valid encounter within last 12 months    Recent Outpatient Visits           5 months ago Major depression, recurrent, chronic [facility])   [facility] Steele Sizer, MD   8 months ago Memory problem   [facility] Steele Sizer, MD   11 months ago Interstitial lung disease [facility])   [facility] Steele Sizer, MD   1 year ago Iron deficiency anemia, unspecified iron deficiency anemia type   [facility] Steele Sizer, MD   1 year ago Interstitial lung disease [facility])   [facility] Steele Sizer, MD       Future Appointments             Today Steele Sizer, [facility]

## 2022-04-21 NOTE — Telephone Encounter (Signed)
Medication Refill - Medication: levothyroxine (SYNTHROID) 200 MCG tablet omeprazole (PRILOSEC) 40 MG capsule  Has the patient contacted their pharmacy? No. Patient needed an appointment (Agent: If no, request that the patient contact the pharmacy for the refill. If patient does not wish to contact the pharmacy document the reason why and proceed with request.)   Preferred Pharmacy (with phone number or street name): [place] [facility], [address] AT [place]   Has the patient been seen for an appointment in the last year OR does the patient have an upcoming appointment? No.  Agent: Please be advised that RX refills may take up to 3 business days. We ask that you follow-up with your pharmacy.  Patient is completely out of the medication and has been out for three weeks.

## 2022-04-21 NOTE — Telephone Encounter (Signed)
  Chief Complaint: right side abd pain Symptoms: tender to touch, cannot bend over, diarrhea, vomiting Frequency: yesterday Pertinent Negatives: Patient denies fever, urination pain, vomiting Disposition: '[]'$ ED /'[]'$ Urgent Care (no appt availability in office) / '[x]'$ Appointment(In office/virtual)/ '[]'$  [place] [facility]/ '[]'$ Home Care/ '[]'$ Refused Recommended Disposition /'[]'$ [place] [facility]/ '[]'$  Follow-up with PCP Additional Notes: apt at [address] Reason for Disposition  [1] MILD-MODERATE pain AND [2] constant AND [3] present > 2 hours  Answer Assessment - Initial Assessment Questions 1. LOCATION: "Where does it hurt?"      Right side- tender to touch can't bend over   2. RADIATION: "Does the pain shoot anywhere else?" (e.g., chest, back)     Shooting pains on side yest.  3. ONSET: "When did the pain begin?" (e.g., minutes, hours or days ago)      Yesterday with belly pain moves to side  4. SUDDEN: "Gradual or sudden onset?"     gradual 5. PATTERN "Does the pain come and go, or is it constant?"    - If it comes and goes: "How long does it last?" "Do you have pain now?"     (Note: Comes and goes means the pain is intermittent. It goes away completely between bouts.)    - If constant: "Is it getting better, staying the same, or getting worse?"      (Note: Constant means the pain never goes away completely; most serious pain is constant and gets worse.)      constant 6. SEVERITY: "How bad is the pain?"  (e.g., Scale 1-10; mild, moderate, or severe)    - MILD (1-3): Doesn't interfere with normal activities, abdomen soft and not tender to touch.     - MODERATE (4-7): Interferes with normal activities or awakens from sleep, abdomen tender to touch.     - SEVERE (8-10): Excruciating pain, doubled over, unable to do any normal activities.       Soreness on side- moderate 7. RECURRENT SYMPTOM: "Have you ever had this type of stomach pain before?" If Yes, ask: "When was the last time?" and "What  happened that time?"      Similar  8. CAUSE: "What do you think is causing the stomach pain?"     appendicitis 9. RELIEVING/AGGRAVATING FACTORS: "What makes it better or worse?" (e.g., antacids, bending or twisting motion, bowel movement)     Laying down  10. OTHER SYMPTOMS: "Do you have any other symptoms?" (e.g., back pain, diarrhea, fever, urination pain, vomiting)       Vomiting, cough, back pain (chronic) 11. PREGNANCY: "Is there any chance you are pregnant?" "When was your last menstrual period?"       N/a  Protocols used: Abdominal Pain - [facility]

## 2022-04-21 NOTE — Patient Instructions (Signed)
Neurologist - Dr. Manuella Ghazi [phone] (Work)

## 2022-05-29 NOTE — Progress Notes (Unsigned)
Name: Debra Soto   MRN: [id number]    DOB: 1960/07/31   Date:05/30/2022       Progress Note  Subjective  Chief Complaint  Follow Up  HPI  Interstitial Lung Disease: seen by Dr. Brennan Bailey 03/2019 for SOB and abnormal CT chest 02/2019 , showed some granulomatous disease also a nodule, She states SOB only with moderate activity, she also  has a chronic cough. She had a repeat CT and still has nodules, per her new pulmonologist Dr. Rennie Natter the plan was initially to do a biopsy, but now the plan is to repeat CT in one year.   Memory changes and family history of Alzheimer's: she needs to write everything down, she has noticed it is getting worse, does not seem to be just her fibromyalgia fog.  She states she needs to have direct questions to be able to answer. We referred her to Dr. Manuella Ghazi but she never got to see him. I will give her his number again today.   Positive ANA and myalgi/Sicca : seen Dr. Manuella Ghazi - Rheumatologist at [facility] , but she had lost to follow up for years , but has been back since Fall 2022. CRP has been elevated , she told me again today that she is taking a little blue pill and a medication that starts with a G. The medication is gabapentin ( saw on Dr. Trena Platt note today). She had a rash last year associated with joint aches and Dr. Manuella Ghazi was  considering starting her on plaquenil however she lost to follow up, last visit was almost one year ago   Hypothyroidism: she is on 200 mcg synthroid one daily and  skipping Sundays, last TSH was at goal. She has been compliant - only skipped dose yesterday and this morning - states prescription was not at the pharmacy    Major Depression: she was has been taking Citalopram and Wellbutrin since 10/2016, she states her memory is getting worse.  She has ADD and is on Adderal. She used to take Abilify but not sure why she stopped. She has fatigue and problems sleeping but is chronic and for other reasons. She needs to see Dr. Manuella Ghazi for memory. She  states currently she is too busy to be depressed    FMS: she stopped Lyrica because it caused her to get moody. Stopped gabapentin because she was forgetting to take it, pain level today is 4/10   Chronic low back pain:she continues to have lower back pain , not on medications - stopped taking gabapentin on her own, she is having stiffness and aching . She has seen GI since and was diagnosed with constipation but no longer soiling   MRI lumbar spine: done 06/05/2021 Degenerative disc and facet disease are most pronounced in the lower lumbar spine resulting in mild foraminal stenosis bilaterally at L5-S1.     Obesity: BMI is over 35 with co-morbidities such as GERD, OSA and dyslipidemia . She had lost 46 lbs last year, but she is trending up again. One year ago weight was 235 lbs and today is 243 lbs  Atherosclerosis of Aorta/Dyslipidemia: she agrees on starting statin therapy, discussed possible side effects.   GERD and hiatal hernia: under the care of GI, had EGD and colonoscopy . She came in last month with severe abdominal pain, CT abdomen showed hiatal hernia and atherosclerosis of arota. She states she continues to have lack of appetite, she still has intermittent mild abdominal pain, no blood in stools, no soiling. She  states has a bowel movement most days of the week. She drinks sodas most of the day and is not hungry for dinner. She has noticed that she has been craving milk lately. She has a history of B12 and iron deficiency anemia due to decrease in food intake   Patient Active Problem List   Diagnosis Date Noted   Sicca, unspecified type ([place]) 04/21/2022   Interstitial lung disease ([place]) 05/16/2021   Morbid obesity ([place]) 12/27/2018   Iron deficiency anemia secondary to inadequate dietary iron intake 11/06/2017   Essential (hemorrhagic) thrombocythemia ([place])    Benign neoplasm of descending colon    Heartburn    Gastric polyp    Psoriasis 01/25/2016   Fibroadenoma of right breast  10/22/2015   Endometriosis 12/31/2014   Insomnia, persistent 09/29/2014   IBS (irritable bowel syndrome) 09/29/2014   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/2014   Dyslipidemia 09/29/2014   Fibromyalgia syndrome 09/29/2014   Gastro-esophageal reflux disease without esophagitis 09/29/2014   Herpes labialis 09/29/2014   Adult hypothyroidism 09/29/2014   Family history of malignant neoplasm of breast 09/29/2014   Major depression, recurrent, chronic ([place]) 09/29/2014   Obstructive apnea 09/29/2014   Abnormal presence of protein in urine 09/29/2014   Allergic rhinitis 09/29/2014   Vitamin D deficiency 09/29/2014   Urge incontinence 09/29/2014   Headache, tension-type 09/29/2014   Menopausal symptom 09/29/2014    Past Surgical History:  Procedure Laterality Date   ABDOMINAL HYSTERECTOMY  [id number]   bladder tact     BREAST BIOPSY Right 09/02/2015   US Guided biopsy - Ribbon shaped marker - negative   CHOLECYSTECTOMY  [id number]   COLONOSCOPY     COLONOSCOPY WITH PROPOFOL N/A 02/10/2016   Procedure: COLONOSCOPY WITH PROPOFOL;  Surgeon: Lucilla Lame, MD;  Location: [place];  Service: Endoscopy;  Laterality: N/A;   ESOPHAGOGASTRODUODENOSCOPY     ESOPHAGOGASTRODUODENOSCOPY (EGD) WITH PROPOFOL N/A 02/10/2016   Procedure: ESOPHAGOGASTRODUODENOSCOPY (EGD) WITH PROPOFOL;  Surgeon: Lucilla Lame, MD;  Location: [place];  Service: Endoscopy;  Laterality: N/A;  sleep apnea   LIPOMA EXCISION     POLYPECTOMY  02/10/2016   Procedure: POLYPECTOMY;  Surgeon: Lucilla Lame, MD;  Location: [place];  Service: Endoscopy;;   TONSILECTOMY, ADENOIDECTOMY, BILATERAL MYRINGOTOMY AND TUBES      Family History  Problem Relation Age of Onset   Hypertension Mother    Arthritis Mother    Cancer Mother        ovarian,breast, lung, liver   Breast cancer Mother 72   Ovarian cancer Mother 29   Lung cancer Mother 80   Colon cancer Brother    Other Father        lung problems   Ovarian  cancer Maternal Grandmother        pt thinks is was ovarian but not sure   Heart disease Brother    Cancer Maternal Grandfather     Social History   Tobacco Use   Smoking status: Never   Smokeless tobacco: Never  Substance Use Topics   Alcohol use: Yes    Alcohol/week: 0.0 standard drinks of alcohol    Comment: rarely     Current Outpatient Medications:    amphetamine-dextroamphetamine (ADDERALL XR) 15 MG 24 hr capsule, Take 1 capsule by mouth every morning., Disp: 30 capsule, Rfl: 0   baclofen (LIORESAL) 10 MG tablet, Take 1 tablet (10 mg total) by mouth at bedtime., Disp: 90 each, Rfl: 0   buPROPion (WELLBUTRIN XL) 150 MG 24 hr tablet, Every  morning, Disp: 90 tablet, Rfl: 1   Ca Phosphate-Cholecalciferol 200-200 MG-UNIT CHEW, Chew 1 each by mouth daily., Disp: , Rfl:    levothyroxine (SYNTHROID) 200 MCG tablet, Take 1 tablet (200 mcg total) by mouth daily before breakfast., Disp: 90 tablet, Rfl: 1   omeprazole (PRILOSEC) 40 MG capsule, Take 1 capsule (40 mg total) by mouth in the morning and at bedtime., Disp: 180 capsule, Rfl: 1   valACYclovir (VALTREX) 1000 MG tablet, TAKE 1 TABLET(1000 MG) BY MOUTH TWICE DAILY, Disp: 30 tablet, Rfl: 0   citalopram (CELEXA) 20 MG tablet, TAKE 1 TABLET BY MOUTH DAILY (Patient not taking: Reported on 05/30/2022), Disp: 90 tablet, Rfl: 1   Cyanocobalamin 1000 MCG SUBL, Place 1 each under the tongue daily at 12 noon. (Patient not taking: Reported on 05/30/2022), Disp: , Rfl:   No Known Allergies  I personally reviewed active problem list, medication list, allergies, family history, social history, health maintenance with the patient/caregiver today.   ROS  Constitutional: Negative for fever or weight change.  Respiratory: Negative for cough and shortness of breath.   Cardiovascular: Negative for chest pain or palpitations.  Gastrointestinal: Negative for abdominal pain, no bowel changes.  Musculoskeletal: Negative for gait problem or joint  swelling.  Skin: Negative for rash.  Neurological: Negative for dizziness or headache.  No other specific complaints in a complete review of systems (except as listed in HPI above).   Objective  Vitals:   05/30/22 0916  BP: 126/78  Pulse: 91  Resp: 16  SpO2: 99%  Weight: 243 lb (110.2 kg)  Height: 5\' 8"  (1.727 m)    Body mass index is 36.95 kg/m.  Physical Exam  Constitutional: Patient appears well-developed and well-nourished. Obese  No distress.  HEENT: head atraumatic, normocephalic, pupils equal and reactive to light, neck supple Cardiovascular: Normal rate, regular rhythm and normal heart sounds.  No murmur heard. No BLE edema. Pulmonary/Chest: Effort normal and breath sounds normal. No respiratory distress. Abdominal: Soft.  There is no tenderness. Psychiatric: Patient has a normal mood and affect. behavior is normal. Judgment and thought content normal.   Recent Results (from the past 2160 hour(s))  Comprehensive Metabolic Panel (CMET)     Status: Abnormal   Collection Time: 04/21/22  1:06 PM  Result Value Ref Range   Sodium 131 (L) 135 - 145 mmol/L   Potassium 4.6 3.5 - 5.1 mmol/L   Chloride 95 (L) 98 - 111 mmol/L   CO2 27 22 - 32 mmol/L   Glucose, Bld 100 (H) 70 - 99 mg/dL    Comment: Glucose reference range applies only to samples taken after fasting for at least 8 hours.   BUN 23 8 - 23 mg/dL   Creatinine, Ser 1.16 (H) 0.44 - 1.00 mg/dL   Calcium 9.4 8.9 - 10.3 mg/dL   Total Protein 8.9 (H) 6.5 - 8.1 g/dL   Albumin 4.0 3.5 - 5.0 g/dL   AST 21 15 - 41 U/L   ALT 17 0 - 44 U/L   Alkaline Phosphatase 121 38 - 126 U/L   Total Bilirubin 0.6 0.3 - 1.2 mg/dL   GFR, Estimated 54 (L) >60 mL/min    Comment: (NOTE) Calculated using the CKD-EPI Creatinine Equation (2021)    Anion gap 9 5 - 15    Comment: Performed at [facility], [address] [place], [place] [address]  CBC with Differential/Platelet     Status: Abnormal   Collection Time: 04/21/22   1:06 PM  Result Value Ref Range   WBC 11.2 (H) 4.0 - 10.5 K/uL   RBC 4.84 3.87 - 5.11 MIL/uL   Hemoglobin 10.5 (L) 12.0 - 15.0 g/dL   HCT 36.0 36.0 - 46.0 %   MCV 74.4 (L) 80.0 - 100.0 fL   MCH 21.7 (L) 26.0 - 34.0 pg   MCHC 29.2 (L) 30.0 - 36.0 g/dL   RDW 15.7 (H) 11.5 - 15.5 %   Platelets 538 (H) 150 - 400 K/uL   nRBC 0.0 0.0 - 0.2 %   Neutrophils Relative % 62 %   Neutro Abs 7.0 1.7 - 7.7 K/uL   Lymphocytes Relative 30 %   Lymphs Abs 3.3 0.7 - 4.0 K/uL   Monocytes Relative 6 %   Monocytes Absolute 0.6 0.1 - 1.0 K/uL   Eosinophils Relative 0 %   Eosinophils Absolute 0.1 0.0 - 0.5 K/uL   Basophils Relative 1 %   Basophils Absolute 0.1 0.0 - 0.1 K/uL   Immature Granulocytes 1 %   Abs Immature Granulocytes 0.06 0.00 - 0.07 K/uL    Comment: Performed at [facility], [address] [place], [place] [address]    PHQ2/9:    05/30/2022    9:16 AM 04/21/2022   11:47 AM 11/03/2021    8:33 AM 08/03/2021    8:28 AM 05/16/2021    8:53 AM  Depression screen PHQ 2/9  Decreased Interest 0 0 0 0 0  Down, Depressed, Hopeless 0 0 0 0 0  PHQ - 2 Score 0 0 0 0 0  Altered sleeping 0 0 3 3 3   Tired, decreased energy 0 0 3 0 3  Change in appetite 0 0 2 0 0  Feeling bad or failure about yourself  0 0 0 0 1  Trouble concentrating 0 0 3 0 3  Moving slowly or fidgety/restless 0 0 0 0 0  Suicidal thoughts 0 0 0 0 0  PHQ-9 Score 0 0 11 3 10     phq 9 is negative   Fall Risk:    05/30/2022    9:15 AM 04/21/2022   11:47 AM 11/03/2021    8:33 AM 08/03/2021    8:28 AM 05/16/2021    8:53 AM  Fall Risk   Falls in the past year? 0 0 1 1 1   Number falls in past yr: 0  0 1 0  Injury with Fall? 0  0 1 1  Risk for fall due to : No Fall Risks No Fall Risks No Fall Risks No Fall Risks No Fall Risks  Follow up Falls prevention discussed Falls prevention discussed Falls prevention discussed Falls prevention discussed Falls prevention discussed      Functional Status Survey: Is the patient  deaf or have difficulty hearing?: Yes Does the patient have difficulty seeing, even when wearing glasses/contacts?: Yes Does the patient have difficulty concentrating, remembering, or making decisions?: Yes Does the patient have difficulty walking or climbing stairs?: No Does the patient have difficulty dressing or bathing?: No Does the patient have difficulty doing errands alone such as visiting a doctor's office or shopping?: No    Assessment & Plan   1. Morbid obesity ([place])  BMI still above 35 but also needs to focus on healthy eating  2. Interstitial lung disease ([place])  She will follow up with pulmonologist in the Summer   3. Major depression, recurrent, chronic ([facility])  - buPROPion (WELLBUTRIN XL) 150 MG 24 hr tablet; Every morning  Dispense: 90 tablet; Refill:  1 - amphetamine-dextroamphetamine (ADDERALL XR) 15 MG 24 hr capsule; Take 1 capsule by mouth every morning.  Dispense: 30 capsule; Refill: 0  4. Atherosclerosis of abdominal aorta ([facility])  - rosuvastatin (CRESTOR) 5 MG tablet; Take 1 tablet (5 mg total) by mouth daily.  Dispense: 90 tablet; Refill: 1  5. Iron deficiency anemia secondary to inadequate dietary iron intake  - CBC with Differential/Platelet - Iron, TIBC and Ferritin Panel  6. B12 deficiency  - B12 and Folate Panel  7. Dyslipidemia  - Lipid panel  8. Adult hypothyroidism  - TSH  9. Hiatal hernia without gangrene and obstruction   10. Vitamin D deficiency  - VITAMIN D 25 Hydroxy (Vit-D Deficiency, Fractures)  11. Gastro-esophageal reflux disease without esophagitis   12. DDD (degenerative disc disease), lumbar   13. Fibromyalgia syndrome  - amphetamine-dextroamphetamine (ADDERALL XR) 15 MG 24 hr capsule; Take 1 capsule by mouth every morning.  Dispense: 30 capsule; Refill: 0  14. Long-term use of high-risk medication  - COMPLETE METABOLIC PANEL WITH GFR

## 2022-05-30 ENCOUNTER — Ambulatory Visit (INDEPENDENT_AMBULATORY_CARE_PROVIDER_SITE_OTHER): Payer: 59 | Admitting: Family Medicine

## 2022-05-30 ENCOUNTER — Encounter: Payer: Self-pay | Admitting: Family Medicine

## 2022-05-30 DIAGNOSIS — E039 Hypothyroidism, unspecified: Secondary | ICD-10-CM

## 2022-05-30 DIAGNOSIS — F339 Major depressive disorder, recurrent, unspecified: Secondary | ICD-10-CM | POA: Diagnosis not present

## 2022-05-30 DIAGNOSIS — J849 Interstitial pulmonary disease, unspecified: Secondary | ICD-10-CM | POA: Diagnosis not present

## 2022-05-30 DIAGNOSIS — I700 Atherosclerosis of aorta: Secondary | ICD-10-CM

## 2022-05-30 DIAGNOSIS — M797 Fibromyalgia: Secondary | ICD-10-CM

## 2022-05-30 DIAGNOSIS — E559 Vitamin D deficiency, unspecified: Secondary | ICD-10-CM

## 2022-05-30 DIAGNOSIS — M51369 Other intervertebral disc degeneration, lumbar region without mention of lumbar back pain or lower extremity pain: Secondary | ICD-10-CM

## 2022-05-30 DIAGNOSIS — Z79899 Other long term (current) drug therapy: Secondary | ICD-10-CM

## 2022-05-30 DIAGNOSIS — E538 Deficiency of other specified B group vitamins: Secondary | ICD-10-CM

## 2022-05-30 DIAGNOSIS — K449 Diaphragmatic hernia without obstruction or gangrene: Secondary | ICD-10-CM

## 2022-05-30 DIAGNOSIS — E785 Hyperlipidemia, unspecified: Secondary | ICD-10-CM

## 2022-05-30 DIAGNOSIS — M5136 Other intervertebral disc degeneration, lumbar region: Secondary | ICD-10-CM

## 2022-05-30 DIAGNOSIS — D508 Other iron deficiency anemias: Secondary | ICD-10-CM

## 2022-05-30 DIAGNOSIS — K219 Gastro-esophageal reflux disease without esophagitis: Secondary | ICD-10-CM

## 2022-05-30 MED ORDER — BUPROPION HCL ER (XL) 150 MG PO TB24: ORAL_TABLET | ORAL | 1 refills | Status: DC

## 2022-05-30 MED ORDER — AMPHETAMINE-DEXTROAMPHET ER 15 MG PO CP24: 15.0000 mg | ORAL_CAPSULE | ORAL | 0 refills | Status: DC

## 2022-05-30 MED ORDER — ROSUVASTATIN CALCIUM 5 MG PO TABS: 5.0000 mg | ORAL_TABLET | Freq: Every day | ORAL | 1 refills | Status: DC

## 2022-05-30 NOTE — Patient Instructions (Addendum)
B12 1000 mcg Sublingually   Gastroenterologist  Alisa Graff, [place]  [address] [place], [place] [address]    Neurologist  Manuella Ghazi, [place], [place]  [facility]  [place], [place] [address]  [phone] (Work)  [phone] (Fax)  Referral   Rheumatologist  Mercie Eon, MD  [address]  [place],  [address]  Phone: [phone]  Fax: [phone]

## 2022-05-31 LAB — COMPLETE METABOLIC PANEL WITH GFR
AG Ratio: 1 (calc) (ref 1.0–2.5)
ALT: 8 U/L (ref 6–29)
AST: 10 U/L (ref 10–35)
Albumin: 3.7 g/dL (ref 3.6–5.1)
Alkaline phosphatase (APISO): 130 U/L (ref 37–153)
BUN: 19 mg/dL (ref 7–25)
CO2: 28 mmol/L (ref 20–32)
Calcium: 9.3 mg/dL (ref 8.6–10.4)
Chloride: 102 mmol/L (ref 98–110)
Creat: 0.97 mg/dL (ref 0.50–1.05)
Globulin: 3.6 g/dL (calc) (ref 1.9–3.7)
Glucose, Bld: 87 mg/dL (ref 65–99)
Potassium: 4.8 mmol/L (ref 3.5–5.3)
Sodium: 138 mmol/L (ref 135–146)
Total Bilirubin: 0.2 mg/dL (ref 0.2–1.2)
Total Protein: 7.3 g/dL (ref 6.1–8.1)
eGFR: 66 mL/min/{1.73_m2} (ref >=60)

## 2022-05-31 LAB — LIPID PANEL
Cholesterol: 182 mg/dL (ref <200)
HDL: 49 mg/dL — ABNORMAL LOW (ref >=50)
LDL Cholesterol (Calc): 110 mg/dL (calc) — ABNORMAL HIGH
Non-HDL Cholesterol (Calc): 133 mg/dL (calc) — ABNORMAL HIGH (ref <130)
Total CHOL/HDL Ratio: 3.7 (calc) (ref <5.0)
Triglycerides: 124 mg/dL (ref <150)

## 2022-05-31 LAB — CBC WITH DIFFERENTIAL/PLATELET
Absolute Monocytes: 429 cells/uL (ref 200–950)
Basophils Absolute: 73 cells/uL (ref 0–200)
Basophils Relative: 0.9 %
Eosinophils Absolute: 81 cells/uL (ref 15–500)
Eosinophils Relative: 1 %
HCT: 31.5 % — ABNORMAL LOW (ref 35.0–45.0)
Hemoglobin: 9 g/dL — ABNORMAL LOW (ref 11.7–15.5)
Lymphs Abs: 2236 cells/uL (ref 850–3900)
MCH: 21.2 pg — ABNORMAL LOW (ref 27.0–33.0)
MCHC: 28.6 g/dL — ABNORMAL LOW (ref 32.0–36.0)
MCV: 74.1 fL — ABNORMAL LOW (ref 80.0–100.0)
MPV: 10.8 fL (ref 7.5–12.5)
Monocytes Relative: 5.3 %
Neutro Abs: 5281 cells/uL (ref 1500–7800)
Neutrophils Relative %: 65.2 %
Platelets: 465 10*3/uL — ABNORMAL HIGH (ref 140–400)
RBC: 4.25 10*6/uL (ref 3.80–5.10)
RDW: 15.3 % — ABNORMAL HIGH (ref 11.0–15.0)
Total Lymphocyte: 27.6 %
WBC: 8.1 10*3/uL (ref 3.8–10.8)

## 2022-05-31 LAB — B12 AND FOLATE PANEL
Folate: 4.9 ng/mL — ABNORMAL LOW
Vitamin B-12: 324 pg/mL (ref 200–1100)

## 2022-05-31 LAB — IRON,TIBC AND FERRITIN PANEL
%SAT: 3 % (calc) — ABNORMAL LOW (ref 16–45)
Ferritin: 2 ng/mL — ABNORMAL LOW (ref 16–288)
Iron: 12 ug/dL — ABNORMAL LOW (ref 45–160)
TIBC: 379 mcg/dL (calc) (ref 250–450)

## 2022-05-31 LAB — TSH: TSH: 5.13 mIU/L — ABNORMAL HIGH (ref 0.40–4.50)

## 2022-05-31 LAB — VITAMIN D 25 HYDROXY (VIT D DEFICIENCY, FRACTURES): Vit D, 25-Hydroxy: 38 ng/mL (ref 30–100)

## 2022-06-01 ENCOUNTER — Telehealth: Payer: Self-pay | Admitting: Family Medicine

## 2022-06-01 NOTE — Telephone Encounter (Signed)
Debra Soto with [facility] pharmacy calling to verify instructions on Wellbutrin XL.

## 2022-09-04 NOTE — Progress Notes (Unsigned)
Name: Debra Soto   MRN: [id number]    DOB: 1960/08/05   Date:09/05/2022       Progress Note  Subjective  Chief Complaint  Follow Up  HPI  Interstitial Lung Disease: seen by Dr. Lorenda Cahill 03/2019 for SOB and abnormal CT chest 02/2019 , showed some granulomatous disease also a nodule, she had the last CT done 05/2021. She is due for a repeat. , She states SOB only with moderate activity, she also  has a chronic cough. Last visit with pulmonologist was 11/2021 Dr. Derek Mound   Memory changes and family history of Alzheimer's: she needs to write everything down, she has noticed it is getting worse, does not seem to be just her fibromyalgia fog.  She states she needs to have direct questions to be able to answer. We referred her to Dr. Sherryll Burger  - but does not look like she was seen at all. She asked for numbers again   Positive ANA and myalgia/Sicca : seen Dr. Sherryll Burger - Rheumatologist at [facility] but also lost to follow up. CRP has been elevated . Explained importance of seeing her again . Explained importance of compliance. We will type all her doctors names to her clinical summary   Hypothyroidism: she is on 200 mcg synthroid one daily and  skipping Sundays, last TSH was not at goal. She lost weight since last visit. No change in bowel movements, no palpitation    Major Depression: she was on Citalopram and Wellbutrin since 10/2016, she was also on  Adderal for ADD and Abilify that she stopped a while back. She has Citalopram and Wellbutrin at home but stopped taking medications a few weeks ago because she was feeling numb. She states she is practicing mindfulness and states she is feeling better, she tried to stay off Adderal but unable to focus without it . She had a lot of withdrawal symptoms from stopping cilalopram but improving now    FMS: she stopped Lyrica because it caused her to get moody. Stopped gabapentin because she was forgetting to take it, pain level today is 2/10. She is doing well    Chronic low back pain:she continues to have lower back pain , not on medications - stopped taking gabapentin on her own, she is having stiffness and aching . She has been walking and also riding a stationary bike and is feeling better   MRI lumbar spine: done 06/05/2021 Degenerative disc and facet disease are most pronounced in the lower lumbar spine resulting in mild foraminal stenosis bilaterally at L5-S1.     Obesity: BMI is below 35 again . She had lost 46 lbs in 2022  It was 235 lbs it went up to 243 lbs early 2024 but is down to 229 lbs today Weight seems to go up and down   Atherosclerosis of Aorta/Dyslipidemia: she ran out of Rosuvastatin and forgot to refill it , we will send a refill today   GERD and hiatal hernia: under the care of GI, had EGD and colonoscopy . She has intermittent episodes of severe abdominal pain, CT abdomen showed hiatal hernia and atherosclerosis of arota. She states she continues to have lack of appetite, she still has intermittent mild abdominal pain, no blood in stools, no soiling.  She has almost completely eliminated sodas from her diet, she is drinking some tea with her meals but not always with sugar.   Anemia : iron deficiency but also has folate and B12 deficiency, not taking supplements last HCT was low and  she is past due for a visit with GI and also hematologist   Patient Active Problem List   Diagnosis Date Noted   Atherosclerosis of abdominal aorta (HCC) 05/30/2022   B12 deficiency 05/30/2022   Hiatal hernia without gangrene and obstruction 05/30/2022   Sicca, unspecified type (HCC) 04/21/2022   Interstitial lung disease (HCC) 05/16/2021   Morbid obesity (HCC) 12/27/2018   Iron deficiency anemia secondary to inadequate dietary iron intake 11/06/2017   Essential (hemorrhagic) thrombocythemia (HCC)    Benign neoplasm of descending colon    Heartburn    Gastric polyp    Psoriasis 01/25/2016   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/22/2015    Endometriosis 12/31/2014   Insomnia, persistent 09/29/2014   IBS (irritable bowel syndrome) 09/29/2014   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/2014   Dyslipidemia 09/29/2014   Fibromyalgia syndrome 09/29/2014   Gastro-esophageal reflux disease without esophagitis 09/29/2014   Herpes labialis 09/29/2014   Adult hypothyroidism 09/29/2014   Family history of malignant neoplasm of breast 09/29/2014   Major depression, recurrent, chronic (HCC) 09/29/2014   Obstructive apnea 09/29/2014   Abnormal presence of protein in urine 09/29/2014   Allergic rhinitis 09/29/2014   Vitamin D deficiency 09/29/2014   Urge incontinence 09/29/2014   Headache, tension-type 09/29/2014   Menopausal symptom 09/29/2014    Past Surgical History:  Procedure Laterality Date   ABDOMINAL HYSTERECTOMY  [id number]   bladder tact     BREAST BIOPSY Right 09/02/2015   US Guided biopsy - Ribbon shaped marker - negative   CHOLECYSTECTOMY  [id number]   COLONOSCOPY     COLONOSCOPY WITH PROPOFOL N/A 02/10/2016   Procedure: COLONOSCOPY WITH PROPOFOL;  Surgeon: Midge Minium, MD;  Location: [facility];  Service: Endoscopy;  Laterality: N/A;   ESOPHAGOGASTRODUODENOSCOPY     ESOPHAGOGASTRODUODENOSCOPY (EGD) WITH PROPOFOL N/A 02/10/2016   Procedure: ESOPHAGOGASTRODUODENOSCOPY (EGD) WITH PROPOFOL;  Surgeon: Midge Minium, MD;  Location: [facility];  Service: Endoscopy;  Laterality: N/A;  sleep apnea   LIPOMA EXCISION     POLYPECTOMY  02/10/2016   Procedure: POLYPECTOMY;  Surgeon: Midge Minium, MD;  Location: [facility];  Service: Endoscopy;;   TONSILECTOMY, ADENOIDECTOMY, BILATERAL MYRINGOTOMY AND TUBES      Family History  Problem Relation Age of Onset   Hypertension Mother    Arthritis Mother    Cancer Mother        ovarian,breast, lung, liver   Breast cancer Mother 58   Ovarian cancer Mother 45   Lung cancer Mother 11   Colon cancer Brother    Other Father        lung problems   Ovarian cancer  Maternal Grandmother        pt thinks is was ovarian but not sure   Heart disease Brother    Cancer Maternal Grandfather     Social History   Tobacco Use   Smoking status: Never   Smokeless tobacco: Never  Substance Use Topics   Alcohol use: Yes    Alcohol/week: 0.0 standard drinks of alcohol    Comment: rarely     Current Outpatient Medications:    amphetamine-dextroamphetamine (ADDERALL XR) 15 MG 24 hr capsule, Take 1 capsule by mouth every morning., Disp: 30 capsule, Rfl: 0   amphetamine-dextroamphetamine (ADDERALL XR) 15 MG 24 hr capsule, Take 1 capsule by mouth every morning., Disp: 30 capsule, Rfl: 0   amphetamine-dextroamphetamine (ADDERALL XR) 15 MG 24 hr capsule, Take 1 capsule by mouth every morning., Disp: 30 capsule, Rfl: 0   Ca Phosphate-Cholecalciferol  200-200 MG-UNIT CHEW, Chew 1 each by mouth daily., Disp: , Rfl:    levothyroxine (SYNTHROID) 200 MCG tablet, Take 1 tablet (200 mcg total) by mouth daily before breakfast., Disp: 90 tablet, Rfl: 1   omeprazole (PRILOSEC) 40 MG capsule, Take 1 capsule (40 mg total) by mouth in the morning and at bedtime., Disp: 180 capsule, Rfl: 1   valACYclovir (VALTREX) 1000 MG tablet, TAKE 1 TABLET(1000 MG) BY MOUTH TWICE DAILY, Disp: 30 tablet, Rfl: 0   baclofen (LIORESAL) 10 MG tablet, Take 1 tablet (10 mg total) by mouth at bedtime. (Patient not taking: Reported on 09/05/2022), Disp: 90 each, Rfl: 0   buPROPion (WELLBUTRIN XL) 150 MG 24 hr tablet, Every morning (Patient not taking: Reported on 09/05/2022), Disp: 90 tablet, Rfl: 1   citalopram (CELEXA) 20 MG tablet, TAKE 1 TABLET BY MOUTH DAILY (Patient not taking: Reported on 05/30/2022), Disp: 90 tablet, Rfl: 1   Cyanocobalamin 1000 MCG SUBL, Place 1 each under the tongue daily at 12 noon. (Patient not taking: Reported on 05/30/2022), Disp: , Rfl:    rosuvastatin (CRESTOR) 5 MG tablet, Take 1 tablet (5 mg total) by mouth daily. (Patient not taking: Reported on 09/05/2022), Disp: 90 tablet,  Rfl: 1  No Known Allergies  I personally reviewed active problem list, medication list, allergies, family history, social history, health maintenance with the patient/caregiver today.   ROS  Ten systems reviewed and is negative except as mentioned in HPI   Objective  Vitals:   09/05/22 0845  BP: 130/70  Pulse: 90  Resp: 16  SpO2: 100%  Weight: 229 lb (103.9 kg)  Height: 5\' 8"  (1.727 m)    Body mass index is 34.82 kg/m.  Physical Exam  Constitutional: Patient appears well-developed and well-nourished. Obese  No distress.  HEENT: head atraumatic, normocephalic, pupils equal and reactive to light, neck supple Cardiovascular: Normal rate, regular rhythm and normal heart sounds.  No murmur heard. No BLE edema. Pulmonary/Chest: Effort normal and breath sounds normal. No respiratory distress. Abdominal: Soft.  There is no tenderness. Psychiatric: Patient has a normal mood and affect. behavior is normal. Judgment and thought content normal.    PHQ2/9:    09/05/2022    8:44 AM 05/30/2022    9:16 AM 04/21/2022   11:47 AM 11/03/2021    8:33 AM 08/03/2021    8:28 AM  Depression screen PHQ 2/9  Decreased Interest 0 0 0 0 0  Down, Depressed, Hopeless 0 0 0 0 0  PHQ - 2 Score 0 0 0 0 0  Altered sleeping 3 0 0 3 3  Tired, decreased energy 0 0 0 3 0  Change in appetite 0 0 0 2 0  Feeling bad or failure about yourself  0 0 0 0 0  Trouble concentrating 1 0 0 3 0  Moving slowly or fidgety/restless 0 0 0 0 0  Suicidal thoughts 0 0 0 0 0  PHQ-9 Score 4 0 0 11 3    phq 9 is negative   Fall Risk:    09/05/2022    8:44 AM 05/30/2022    9:15 AM 04/21/2022   11:47 AM 11/03/2021    8:33 AM 08/03/2021    8:28 AM  Fall Risk   Falls in the past year? 0 0 0 1 1  Number falls in past yr: 0 0  0 1  Injury with Fall? 0 0  0 1  Risk for fall due to : No Fall Risks No Fall Risks  No Fall Risks No Fall Risks No Fall Risks  Follow up Falls prevention discussed Falls prevention discussed Falls  prevention discussed Falls prevention discussed Falls prevention discussed      Functional Status Survey: Is the patient deaf or have difficulty hearing?: Yes Does the patient have difficulty seeing, even when wearing glasses/contacts?: Yes Does the patient have difficulty concentrating, remembering, or making decisions?: Yes Does the patient have difficulty walking or climbing stairs?: No Does the patient have difficulty dressing or bathing?: No Does the patient have difficulty doing errands alone such as visiting a doctor's office or shopping?: No    Assessment & Plan  1. Iron deficiency anemia secondary to inadequate dietary iron intake  - CBC with Differential/Platelet - Iron, TIBC and Ferritin Panel  2. Adult hypothyroidism  - TSH  3. Folic acid deficiency  - B12 and Folate Panel - folic acid (FOLVITE) 1 MG tablet; Take 1 tablet (1 mg total) by mouth daily.  Dispense: 100 tablet; Refill: 1  4. Major depression, recurrent, chronic (HCC)  She stopped medications on her own   5. Fibromyalgia syndrome  - amphetamine-dextroamphetamine (ADDERALL XR) 15 MG 24 hr capsule; Take 1 capsule by mouth every morning.  Dispense: 30 capsule; Refill: 0  6. Attention deficit hyperactivity disorder (ADHD), combined type  - amphetamine-dextroamphetamine (ADDERALL XR) 15 MG 24 hr capsule; Take 1 capsule by mouth every morning.  Dispense: 30 capsule; Refill: 0 - amphetamine-dextroamphetamine (ADDERALL XR) 15 MG 24 hr capsule; Take 1 capsule by mouth every morning.  Dispense: 30 capsule; Refill: 0 - amphetamine-dextroamphetamine (ADDERALL XR) 15 MG 24 hr capsule; Take 1 capsule by mouth every morning.  Dispense: 30 capsule; Refill: 0   7. Atherosclerosis of abdominal aorta (HCC)   Resume statin therapy

## 2022-09-05 ENCOUNTER — Ambulatory Visit (INDEPENDENT_AMBULATORY_CARE_PROVIDER_SITE_OTHER): Payer: 59 | Admitting: Family Medicine

## 2022-09-05 ENCOUNTER — Other Ambulatory Visit: Payer: Self-pay | Admitting: Family Medicine

## 2022-09-05 ENCOUNTER — Encounter: Payer: Self-pay | Admitting: Family Medicine

## 2022-09-05 VITALS — BP 130/70 | HR 90 | Resp 16 | Ht 68.0 in | Wt 229.0 lb

## 2022-09-05 DIAGNOSIS — F339 Major depressive disorder, recurrent, unspecified: Secondary | ICD-10-CM | POA: Diagnosis not present

## 2022-09-05 DIAGNOSIS — M797 Fibromyalgia: Secondary | ICD-10-CM

## 2022-09-05 DIAGNOSIS — E039 Hypothyroidism, unspecified: Secondary | ICD-10-CM | POA: Diagnosis not present

## 2022-09-05 DIAGNOSIS — E538 Deficiency of other specified B group vitamins: Secondary | ICD-10-CM

## 2022-09-05 DIAGNOSIS — R921 Mammographic calcification found on diagnostic imaging of breast: Secondary | ICD-10-CM

## 2022-09-05 DIAGNOSIS — D508 Other iron deficiency anemias: Secondary | ICD-10-CM | POA: Diagnosis not present

## 2022-09-05 DIAGNOSIS — F902 Attention-deficit hyperactivity disorder, combined type: Secondary | ICD-10-CM | POA: Insufficient documentation

## 2022-09-05 DIAGNOSIS — I700 Atherosclerosis of aorta: Secondary | ICD-10-CM

## 2022-09-05 LAB — CBC WITH DIFFERENTIAL/PLATELET
Eosinophils Absolute: 43 cells/uL (ref 15–500)
Eosinophils Relative: 0.7 %
Hemoglobin: 8.4 g/dL — ABNORMAL LOW (ref 11.7–15.5)
MCH: 19 pg — ABNORMAL LOW (ref 27.0–33.0)
Neutro Abs: 3331 cells/uL (ref 1500–7800)
RBC: 4.43 10*6/uL (ref 3.80–5.10)

## 2022-09-05 MED ORDER — FOLIC ACID 1 MG PO TABS
1.0000 mg | ORAL_TABLET | Freq: Every day | ORAL | 1 refills | Status: DC
Start: 2022-09-05 — End: 2023-01-09

## 2022-09-05 MED ORDER — AMPHETAMINE-DEXTROAMPHET ER 15 MG PO CP24: 15.0000 mg | ORAL_CAPSULE | ORAL | 0 refills | Status: DC

## 2022-09-06 ENCOUNTER — Other Ambulatory Visit: Payer: Self-pay | Admitting: Family Medicine

## 2022-09-06 DIAGNOSIS — E039 Hypothyroidism, unspecified: Secondary | ICD-10-CM

## 2022-09-06 LAB — CBC WITH DIFFERENTIAL/PLATELET
Absolute Monocytes: 476 cells/uL (ref 200–950)
Basophils Absolute: 79 cells/uL (ref 0–200)
Basophils Relative: 1.3 %
HCT: 29.9 % — ABNORMAL LOW (ref 35.0–45.0)
Lymphs Abs: 2172 cells/uL (ref 850–3900)
MCHC: 28.1 g/dL — ABNORMAL LOW (ref 32.0–36.0)
MCV: 67.5 fL — ABNORMAL LOW (ref 80.0–100.0)
MPV: 10.1 fL (ref 7.5–12.5)
Monocytes Relative: 7.8 %
Neutrophils Relative %: 54.6 %
Platelets: 469 10*3/uL — ABNORMAL HIGH (ref 140–400)
RDW: 17 % — ABNORMAL HIGH (ref 11.0–15.0)
Total Lymphocyte: 35.6 %
WBC: 6.1 10*3/uL (ref 3.8–10.8)

## 2022-09-06 LAB — IRON,TIBC AND FERRITIN PANEL
%SAT: 3 % (calc) — ABNORMAL LOW (ref 16–45)
Ferritin: 3 ng/mL — ABNORMAL LOW (ref 16–288)
Iron: 12 ug/dL — ABNORMAL LOW (ref 45–160)
TIBC: 398 mcg/dL (calc) (ref 250–450)

## 2022-09-06 LAB — B12 AND FOLATE PANEL
Folate: 7.6 ng/mL
Vitamin B-12: 331 pg/mL (ref 200–1100)

## 2022-09-06 LAB — TSH: TSH: 0.57 mIU/L (ref 0.40–4.50)

## 2022-09-06 LAB — CBC MORPHOLOGY

## 2022-09-06 MED ORDER — LEVOTHYROXINE SODIUM 200 MCG PO TABS: 200.0000 ug | ORAL_TABLET | Freq: Every day | ORAL | 0 refills | Status: DC

## 2022-09-28 ENCOUNTER — Ambulatory Visit
Admission: RE | Admit: 2022-09-28 | Discharge: 2022-09-28 | Disposition: A | Discharge status: Home / Self Care | Payer: 59 | Source: Ambulatory Visit | Attending: Family Medicine | Admitting: Family Medicine

## 2022-09-28 DIAGNOSIS — R921 Mammographic calcification found on diagnostic imaging of breast: Secondary | ICD-10-CM | POA: Insufficient documentation

## 2022-11-19 ENCOUNTER — Other Ambulatory Visit: Payer: Self-pay | Admitting: Family Medicine

## 2022-11-19 DIAGNOSIS — F339 Major depressive disorder, recurrent, unspecified: Secondary | ICD-10-CM

## 2022-12-26 ENCOUNTER — Ambulatory Visit: Payer: 59 | Admitting: Family Medicine

## 2023-01-01 ENCOUNTER — Other Ambulatory Visit: Payer: Self-pay | Admitting: Family Medicine

## 2023-01-01 DIAGNOSIS — E039 Hypothyroidism, unspecified: Secondary | ICD-10-CM

## 2023-01-08 NOTE — Progress Notes (Unsigned)
Name: Debra Soto   MRN: [id number]    DOB: [birth year]   Date:01/09/2023       Progress Note  Subjective  Chief Complaint  Follow Up  HPI  Interstitial Lung Disease: seen by Dr. Lorenda Cahill 03/2019 for SOB and abnormal CT chest 02/2019 , showed some granulomatous disease also a nodule, she had the last CT done 05/2021. She is due for a repeat. , She states SOB only with moderate activity, she also  has a chronic cough. Last visit with pulmonologist was 11/2021 Dr. Derek Mound.   Memory changes and family history of Alzheimer's: she needs to write everything down, she has noticed it is getting worse, does not seem to be just her fibromyalgia fog.  She states she needs to have direct questions to be able to answer. We referred her to Dr. Sherryll Burger  - but does not look like she was seen at all. She asked for numbers again. According to referral note patient declined appointment on February 3,2024. Patient states she forgot to schedule the appointment.  Referral was closed will place new referral.   Positive ANA and myalgia/Sicca : seen Dr. Sherryll Burger - Rheumatologist at [facility] but also lost to follow up. CRP has been elevated . Explained importance of seeing her again . Explained importance of compliance. We will type all her doctors names to her clinical summary. Patient has not been back to see Dr. Sherryll Burger.  Re explained the importance of following up with her specialists.   Hypothyroidism: she is on 200 mcg synthroid one daily and  skipping Sundays, last TSH was in normal range.  Will check today.    Major Depression/ADD: she was on Citalopram and Wellbutrin since 10/2016, she was also on  Adderal for ADD and Abilify that she stopped a while back. She has Citalopram and Wellbutrin at home but stopped taking medications a few weeks ago because she was feeling numb. She states she is practicing mindfulness and states she is feeling better, she tried to stay off Adderal but unable to focus without it . She had a lot of  withdrawal symptoms from stopping cilalopram but improving now. Patient requesting refills of her adderall. She denies any weight loss, palpitations.   She says that it helps but she has run out.     10 /29/2024    8:25 AM 01/09/2023    8:22 AM 09/05/2022    8:44 AM 05/30/2022    9:16 AM 04/21/2022   11:47 AM  Depression screen PHQ 2/9  Decreased Interest 0 0 0 0 0  Down, Depressed, Hopeless 0 0 0 0 0  PHQ - 2 Score 0 0 0 0 0  Altered sleeping 0  3 0 0  Tired, decreased energy 0  0 0 0  Change in appetite 0  0 0 0  Feeling bad or failure about yourself  0  0 0 0  Trouble concentrating 0  1 0 0  Moving slowly or fidgety/restless 0  0 0 0  Suicidal thoughts 0  0 0 0  PHQ-9 Score 0  4 0 0  Difficult doing work/chores Not difficult at all        FMS: she stopped Lyrica because it caused her to get moody. Stopped gabapentin because she was forgetting to take it, pain level today is 2/10. She is doing well, no changes  Chronic low back pain:she continues to have lower back pain , not on medications - stopped taking gabapentin on her own, she is having  stiffness and aching . She has been walking and also riding a stationary bike and is feeling better.no changes  MRI lumbar spine: done 06/05/2021 Degenerative disc and facet disease are most pronounced in the lower lumbar spine resulting in mild foraminal stenosis bilaterally at L5-S1.     Obesity: BMI is below 35 again . She had lost 46 lbs in 2022  It was 235 lbs it went up to 243 lbs early 2024  Weight seems to go up and down. Today her weight is 218 lbs. Continue to work on lifestyle modifications.   Atherosclerosis of Aorta/Dyslipidemia: she ran out of Rosuvastatin and forgot to refill it.  Will send in refills.  Patient is not complaint with all her medication.  She says she ran out.    GERD and hiatal hernia: under the care of GI, had EGD and colonoscopy . She has intermittent episodes of severe abdominal pain, CT abdomen showed hiatal  hernia and atherosclerosis of arota. She states she continues to have lack of appetite, she still has intermittent mild abdominal pain, no blood in stools, no soiling.  She has almost completely eliminated sodas from her diet, she is drinking some tea with her meals but not always with sugar. She is no longer taking omeprazole.    Anemia/thrombocytopenia : iron deficiency but also has folate and B12 deficiency, not taking supplements last HCT was low and she is past due for a visit with GI and also hematologist. Last time she had labs done her hgb and hct were low she was instructed to reach out to her hematologist and GI but she did not. Will recheck today. She has not been seen by GI since 04/20/2021. And hematology was back in January of 2023.  She says she lost all her follow up phone numbers, will give her another list of the specialists.  She denies any abnormal bleeding.    Vaginal discharge: patient reports she has had a vaginal discharge for about a month.  She says that the color varies.  She says that one day it was green and has since lightened up.she says she also has had some vaginal itching.   Itching: patient reports that she has been itching a lot. She says that initially it was thought because she came of her depression medication. She denies any new soaps or detergents. She says it is worse at night. Discussed trying hydroxyzine.    Patient Active Problem List   Diagnosis Date Noted   Memory problem 01/09/2023   Degeneration of intervertebral disc of lumbar region with discogenic back pain 01/09/2023   Family history of Alzheimer's disease 01/09/2023   Attention deficit hyperactivity disorder (ADHD), combined type 09/05/2022   Folic acid deficiency 09/05/2022   Atherosclerosis of abdominal aorta (HCC) 05/30/2022   B12 deficiency 05/30/2022   Hiatal hernia without gangrene and obstruction 05/30/2022   Sicca, unspecified type (HCC) 04/21/2022   Interstitial lung disease (HCC)  05/16/2021   Morbid obesity (HCC) 12/27/2018   Iron deficiency anemia 11/06/2017   Essential (hemorrhagic) thrombocythemia (HCC)    Benign neoplasm of descending colon    Heartburn    Gastric polyp    Psoriasis 01/25/2016   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/22/2015   Endometriosis 12/31/2014   Insomnia, persistent 09/29/2014   IBS (irritable bowel syndrome) 09/29/2014   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/2014   Dyslipidemia 09/29/2014   Fibromyalgia syndrome 09/29/2014   Gastro-esophageal reflux disease without esophagitis 09/29/2014   Herpes labialis 09/29/2014   Adult hypothyroidism 09/29/2014  Family history of malignant neoplasm of breast 09/29/2014   Major depression, recurrent, chronic (HCC) 09/29/2014   Obstructive apnea 09/29/2014   Abnormal presence of protein in urine 09/29/2014   Allergic rhinitis 09/29/2014   Vitamin D deficiency 09/29/2014   Urge incontinence 09/29/2014   Headache, tension-type 09/29/2014   Menopausal symptom 09/29/2014    Past Surgical History:  Procedure Laterality Date   ABDOMINAL HYSTERECTOMY  [id number]   bladder tact     BREAST BIOPSY Right 09/02/2015   US Guided biopsy - Ribbon shaped marker - negative   CHOLECYSTECTOMY  [id number]   COLONOSCOPY     COLONOSCOPY WITH PROPOFOL N/A 02/10/2016   Procedure: COLONOSCOPY WITH PROPOFOL;  Surgeon: Midge Minium, MD;  Location: [facility];  Service: Endoscopy;  Laterality: N/A;   ESOPHAGOGASTRODUODENOSCOPY     ESOPHAGOGASTRODUODENOSCOPY (EGD) WITH PROPOFOL N/A 02/10/2016   Procedure: ESOPHAGOGASTRODUODENOSCOPY (EGD) WITH PROPOFOL;  Surgeon: Midge Minium, MD;  Location: [facility];  Service: Endoscopy;  Laterality: N/A;  sleep apnea   LIPOMA EXCISION     POLYPECTOMY  02/10/2016   Procedure: POLYPECTOMY;  Surgeon: Midge Minium, MD;  Location: [facility];  Service: Endoscopy;;   TONSILECTOMY, ADENOIDECTOMY, BILATERAL MYRINGOTOMY AND TUBES      Family History  Problem Relation Age  of Onset   Hypertension Mother    Arthritis Mother    Cancer Mother        ovarian,breast, lung, liver   Breast cancer Mother 69   Ovarian cancer Mother 20   Lung cancer Mother 48   Colon cancer Brother    Other Father        lung problems   Ovarian cancer Maternal Grandmother        pt thinks is was ovarian but not sure   Heart disease Brother    Cancer Maternal Grandfather     Social History   Tobacco Use   Smoking status: Never   Smokeless tobacco: Never  Substance Use Topics   Alcohol use: Yes    Alcohol/week: 0.0 standard drinks of alcohol    Comment: rarely     Current Outpatient Medications:    Ca Phosphate-Cholecalciferol 200-200 MG-UNIT CHEW, Chew 1 each by mouth daily., Disp: , Rfl:    hydrOXYzine (VISTARIL) 25 MG capsule, Take 1 capsule (25 mg total) by mouth at bedtime as needed., Disp: 30 capsule, Rfl: 0   amphetamine-dextroamphetamine (ADDERALL XR) 15 MG 24 hr capsule, Take 1 capsule by mouth every morning., Disp: 30 capsule, Rfl: 0   [START ON 02/08/2023] amphetamine-dextroamphetamine (ADDERALL XR) 15 MG 24 hr capsule, Take 1 capsule by mouth every morning., Disp: 30 capsule, Rfl: 0   amphetamine-dextroamphetamine (ADDERALL XR) 15 MG 24 hr capsule, Take 1 capsule by mouth every morning., Disp: 30 capsule, Rfl: 0   folic acid (FOLVITE) 1 MG tablet, Take 1 tablet (1 mg total) by mouth daily., Disp: 100 tablet, Rfl: 1   levothyroxine (SYNTHROID) 200 MCG tablet, Take 1 tablet (200 mcg total) by mouth daily before breakfast., Disp: 90 tablet, Rfl: 1   rosuvastatin (CRESTOR) 5 MG tablet, Take 1 tablet (5 mg total) by mouth daily., Disp: 90 tablet, Rfl: 1   valACYclovir (VALTREX) 1000 MG tablet, Take 1 tablet (1,000 mg total) by mouth 2 (two) times daily., Disp: 30 tablet, Rfl: 0  No Known Allergies  I personally reviewed active problem list, medication list, allergies, family history, social history, health maintenance with the patient/caregiver  today.   ROS  Constitutional: Negative for fever  or weight change.  Respiratory: Negative for cough and shortness of breath.   Cardiovascular: Negative for chest pain or palpitations.  Gastrointestinal: Negative for abdominal pain, no bowel changes.  Musculoskeletal: Negative for gait problem or joint swelling.  Skin: Negative for rash.  Neurological: Negative for dizziness or headache.  No other specific complaints in a complete review of systems (except as listed in HPI above).   Objective  Vitals:   01/09/23 0822  BP: 128/72  Pulse: 100  Resp: 16  Temp: 97.9 F (36.6 C)  TempSrc: Oral  SpO2: 98%  Weight: 218 lb 4.8 oz (99 kg)  Height: 5\' 8"  (1.727 m)    Body mass index is 33.19 kg/m.  Physical Exam Constitutional: Patient appears well-developed and well-nourished. Obese  No distress.  HEENT: head atraumatic, normocephalic, pupils equal and reactive to light, neck supple, throat within normal limits Cardiovascular: Normal rate, regular rhythm and normal heart sounds.  No murmur heard. No BLE edema. Pulmonary/Chest: Effort normal and breath sounds normal. No respiratory distress. Abdominal: Soft.  There is no tenderness. Psychiatric: Patient has a normal mood and affect. behavior is normal. Judgment and thought content normal.   No results found for this or any previous visit (from the past 2160 hour(s)).   PHQ2/9:    01/09/2023    8:25 AM 01/09/2023    8:22 AM 09/05/2022    8:44 AM 05/30/2022    9:16 AM 04/21/2022   11:47 AM  Depression screen PHQ 2/9  Decreased Interest 0 0 0 0 0  Down, Depressed, Hopeless 0 0 0 0 0  PHQ - 2 Score 0 0 0 0 0  Altered sleeping 0  3 0 0  Tired, decreased energy 0  0 0 0  Change in appetite 0  0 0 0  Feeling bad or failure about yourself  0  0 0 0  Trouble concentrating 0  1 0 0  Moving slowly or fidgety/restless 0  0 0 0  Suicidal thoughts 0  0 0 0  PHQ-9 Score 0  4 0 0  Difficult doing work/chores Not difficult at all         phq 9 is negative   Fall Risk:    01/09/2023    8:22 AM 09/05/2022    8:44 AM 05/30/2022    9:15 AM 04/21/2022   11:47 AM 11/03/2021    8:33 AM  Fall Risk   Falls in the past year? 0 0 0 0 1  Number falls in past yr: 0 0 0  0  Injury with Fall? 0 0 0  0  Risk for fall due to : No Fall Risks No Fall Risks No Fall Risks No Fall Risks No Fall Risks  Follow up Falls prevention discussed Falls prevention discussed Falls prevention discussed Falls prevention discussed Falls prevention discussed      Functional Status Survey: Is the patient deaf or have difficulty hearing?: Yes Does the patient have difficulty seeing, even when wearing glasses/contacts?: Yes Does the patient have difficulty concentrating, remembering, or making decisions?: No Does the patient have difficulty walking or climbing stairs?: No Does the patient have difficulty dressing or bathing?: No Does the patient have difficulty doing errands alone such as visiting a doctor's office or shopping?: No    Assessment & Plan  Problem List Items Addressed This Visit       Cardiovascular and Mediastinum   Atherosclerosis of abdominal aorta (HCC)    Patient has been out of her  rosuvastatin, discussed the importance of keeping up with her medications.  Refill sent.       Relevant Medications   rosuvastatin (CRESTOR) 5 MG tablet   Other Relevant Orders   COMPLETE METABOLIC PANEL WITH GFR     Respiratory   Interstitial lung disease (HCC)    Managed by pulmonology.  Patient has not seen pulmonology since last year.  Provided information to call and schedule follow-up appointment.        Digestive   Gastro-esophageal reflux disease without esophagitis    Patient stopped taking omeprazole due to fear of causing more memory problems.  Reports her acid reflux has been well-controlled with diet.      Relevant Orders   COMPLETE METABOLIC PANEL WITH GFR     Endocrine   Adult hypothyroidism    Currently taking  levothyroxine 200 mcg daily.  Will get labs today.      Relevant Medications   levothyroxine (SYNTHROID) 200 MCG tablet   Other Relevant Orders   TSH     Musculoskeletal and Integument   Degeneration of intervertebral disc of lumbar region with discogenic back pain    Condition stable        Hematopoietic and Hemostatic   Essential (hemorrhagic) thrombocythemia (HCC)    Patient is established with hematology however she has not seen hematology in a while.  Recommend she contact hematology office and schedule appointment.      Relevant Medications   folic acid (FOLVITE) 1 MG tablet   valACYclovir (VALTREX) 1000 MG tablet     Other   Dyslipidemia    Patient has been out of her rosuvastatin, discussed the importance of keeping up with her medications.  Refill sent.       Relevant Medications   rosuvastatin (CRESTOR) 5 MG tablet   Other Relevant Orders   COMPLETE METABOLIC PANEL WITH GFR   Fibromyalgia syndrome    Condition stable at this time.      Relevant Medications   amphetamine-dextroamphetamine (ADDERALL XR) 15 MG 24 hr capsule (Start on 02/08/2023)   Major depression, recurrent, chronic (HCC)    Not currently on antidepressant.      Relevant Medications   hydrOXYzine (VISTARIL) 25 MG capsule   Iron deficiency anemia    Last time she had labs her hemoglobin hematocrit were very low.  She is instructed to follow-up with her hematologist.  Patient did not follow-up with her hematologist.  We will recheck labs today also provided patient information for her hematologist and she will contact them and schedule an appointment      Relevant Medications   folic acid (FOLVITE) 1 MG tablet   Other Relevant Orders   CBC with Differential/Platelet   Iron, TIBC and Ferritin Panel   Morbid obesity (HCC)    Patient continues to work on lifestyle modification.      Relevant Medications   amphetamine-dextroamphetamine (ADDERALL XR) 15 MG 24 hr capsule    amphetamine-dextroamphetamine (ADDERALL XR) 15 MG 24 hr capsule (Start on 02/08/2023)   amphetamine-dextroamphetamine (ADDERALL XR) 15 MG 24 hr capsule   Other Relevant Orders   COMPLETE METABOLIC PANEL WITH GFR   Sicca, unspecified type [facility])    Patient is established with rheumatology.  Patient has not seen rheumatology in a while.  Recommend she contact office and schedule follow-up appointment.      B12 deficiency    Not currently taking supplementation will check labs today.      Relevant Orders   B12 and  Folate Panel   Attention deficit hyperactivity disorder (ADHD), combined type    Refill sent      Relevant Medications   amphetamine-dextroamphetamine (ADDERALL XR) 15 MG 24 hr capsule   amphetamine-dextroamphetamine (ADDERALL XR) 15 MG 24 hr capsule (Start on 02/08/2023)   amphetamine-dextroamphetamine (ADDERALL XR) 15 MG 24 hr capsule   Folic acid deficiency    Patient is unsure if she has been taking her folic acid.  Will check labs today.      Relevant Medications   folic acid (FOLVITE) 1 MG tablet   Other Relevant Orders   B12 and Folate Panel   Memory problem    She continues to have memory problems.  Patient did not go to see neurology as previously requested.  Will place new referral because that referral was closed.  Patient also provided information to contact neurology office.      Relevant Orders   CBC with Differential/Platelet   Iron, TIBC and Ferritin Panel   B12 and Folate Panel   COMPLETE METABOLIC PANEL WITH GFR   Ambulatory referral to Neurology   Family history of Alzheimer's disease    She still has not seen neurologist.  New referral placed.  Information also provided to patient to contact office.      Relevant Orders   Ambulatory referral to Neurology   Other Visit Diagnoses     Vaginal discharge    -  Primary   Vaginal swab collected.   Relevant Orders   Cervicovaginal ancillary only   Need for influenza vaccination       Need for  immunization against influenza       Relevant Orders   Flu vaccine trivalent PF, 6mos and older(Flulaval,Afluria,Fluarix,Fluzone) (Completed)   Itching       Relevant Medications   hydrOXYzine (VISTARIL) 25 MG capsule   Fever blister       Requested refill of Valtrex.  Refill sent.   Relevant Medications   valACYclovir (VALTREX) 1000 MG tablet

## 2023-01-09 ENCOUNTER — Other Ambulatory Visit: Payer: Self-pay

## 2023-01-09 ENCOUNTER — Ambulatory Visit (INDEPENDENT_AMBULATORY_CARE_PROVIDER_SITE_OTHER): Payer: 59 | Admitting: Nurse Practitioner

## 2023-01-09 ENCOUNTER — Other Ambulatory Visit (HOSPITAL_COMMUNITY)
Admission: RE | Admit: 2023-01-09 | Discharge: 2023-01-09 | Disposition: A | Discharge status: Home / Self Care | Payer: 59 | Source: Ambulatory Visit | Attending: Nurse Practitioner | Admitting: Nurse Practitioner

## 2023-01-09 ENCOUNTER — Encounter: Payer: Self-pay | Admitting: Nurse Practitioner

## 2023-01-09 VITALS — BP 128/72 | HR 100 | Temp 97.9°F | Resp 16 | Ht 68.0 in | Wt 218.3 lb

## 2023-01-09 DIAGNOSIS — B001 Herpesviral vesicular dermatitis: Secondary | ICD-10-CM

## 2023-01-09 DIAGNOSIS — J849 Interstitial pulmonary disease, unspecified: Secondary | ICD-10-CM

## 2023-01-09 DIAGNOSIS — Z820 Family history of epilepsy and other diseases of the nervous system: Secondary | ICD-10-CM | POA: Insufficient documentation

## 2023-01-09 DIAGNOSIS — Z23 Encounter for immunization: Secondary | ICD-10-CM

## 2023-01-09 DIAGNOSIS — D473 Essential (hemorrhagic) thrombocythemia: Secondary | ICD-10-CM

## 2023-01-09 DIAGNOSIS — R413 Other amnesia: Secondary | ICD-10-CM

## 2023-01-09 DIAGNOSIS — E538 Deficiency of other specified B group vitamins: Secondary | ICD-10-CM

## 2023-01-09 DIAGNOSIS — M5136 Other intervertebral disc degeneration, lumbar region with discogenic back pain only: Secondary | ICD-10-CM | POA: Insufficient documentation

## 2023-01-09 DIAGNOSIS — I700 Atherosclerosis of aorta: Secondary | ICD-10-CM

## 2023-01-09 DIAGNOSIS — L299 Pruritus, unspecified: Secondary | ICD-10-CM

## 2023-01-09 DIAGNOSIS — D509 Iron deficiency anemia, unspecified: Secondary | ICD-10-CM

## 2023-01-09 DIAGNOSIS — M3500 Sicca syndrome, unspecified: Secondary | ICD-10-CM

## 2023-01-09 DIAGNOSIS — N898 Other specified noninflammatory disorders of vagina: Secondary | ICD-10-CM | POA: Insufficient documentation

## 2023-01-09 DIAGNOSIS — K219 Gastro-esophageal reflux disease without esophagitis: Secondary | ICD-10-CM

## 2023-01-09 DIAGNOSIS — E039 Hypothyroidism, unspecified: Secondary | ICD-10-CM

## 2023-01-09 DIAGNOSIS — F339 Major depressive disorder, recurrent, unspecified: Secondary | ICD-10-CM

## 2023-01-09 DIAGNOSIS — F902 Attention-deficit hyperactivity disorder, combined type: Secondary | ICD-10-CM

## 2023-01-09 DIAGNOSIS — M797 Fibromyalgia: Secondary | ICD-10-CM

## 2023-01-09 DIAGNOSIS — E785 Hyperlipidemia, unspecified: Secondary | ICD-10-CM

## 2023-01-09 MED ORDER — HYDROXYZINE PAMOATE 25 MG PO CAPS: 25.0000 mg | ORAL_CAPSULE | Freq: Every evening | ORAL | 0 refills | Status: DC | PRN

## 2023-01-09 MED ORDER — FOLIC ACID 1 MG PO TABS: 1.0000 mg | ORAL_TABLET | Freq: Every day | ORAL | 1 refills | Status: DC

## 2023-01-09 MED ORDER — AMPHETAMINE-DEXTROAMPHET ER 15 MG PO CP24: 15.0000 mg | ORAL_CAPSULE | ORAL | 0 refills | Status: DC

## 2023-01-09 MED ORDER — VALACYCLOVIR HCL 1 G PO TABS
1000.0000 mg | ORAL_TABLET | Freq: Two times a day (BID) | ORAL | 0 refills | Status: DC
Start: 2023-01-09 — End: 2023-04-17

## 2023-01-09 MED ORDER — LEVOTHYROXINE SODIUM 200 MCG PO TABS: 200.0000 ug | ORAL_TABLET | Freq: Every day | ORAL | 1 refills | Status: DC

## 2023-01-09 MED ORDER — ROSUVASTATIN CALCIUM 5 MG PO TABS: 5.0000 mg | ORAL_TABLET | Freq: Every day | ORAL | 1 refills | Status: DC

## 2023-01-09 NOTE — Assessment & Plan Note (Signed)
Not currently taking supplementation will check labs today.

## 2023-01-09 NOTE — Assessment & Plan Note (Signed)
Currently taking levothyroxine 200 mcg daily.  Will get labs today.

## 2023-01-09 NOTE — Assessment & Plan Note (Signed)
Patient is established with rheumatology.  Patient has not seen rheumatology in a while.  Recommend she contact office and schedule follow-up appointment.

## 2023-01-09 NOTE — Assessment & Plan Note (Signed)
Condition stable at this time. 

## 2023-01-09 NOTE — Assessment & Plan Note (Signed)
She continues to have memory problems.  Patient did not go to see neurology as previously requested.  Will place new referral because that referral was closed.  Patient also provided information to contact neurology office.

## 2023-01-09 NOTE — Assessment & Plan Note (Signed)
Not currently on antidepressant.

## 2023-01-09 NOTE — Assessment & Plan Note (Signed)
Patient continues to work on lifestyle modification.

## 2023-01-09 NOTE — Assessment & Plan Note (Signed)
Last time she had labs her hemoglobin hematocrit were very low.  She is instructed to follow-up with her hematologist.  Patient did not follow-up with her hematologist.  We will recheck labs today also provided patient information for her hematologist and she will contact them and schedule an appointment

## 2023-01-09 NOTE — Assessment & Plan Note (Signed)
Patient is established with hematology however she has not seen hematology in a while.  Recommend she contact hematology office and schedule appointment.

## 2023-01-09 NOTE — Assessment & Plan Note (Signed)
Condition stable  

## 2023-01-09 NOTE — Assessment & Plan Note (Signed)
Refill sent.

## 2023-01-09 NOTE — Assessment & Plan Note (Signed)
Patient has been out of her rosuvastatin, discussed the importance of keeping up with her medications.  Refill sent.

## 2023-01-09 NOTE — Assessment & Plan Note (Signed)
Patient is unsure if she has been taking her folic acid.  Will check labs today.

## 2023-01-09 NOTE — Assessment & Plan Note (Signed)
She still has not seen neurologist.  New referral placed.  Information also provided to patient to contact office.

## 2023-01-09 NOTE — Assessment & Plan Note (Signed)
Patient stopped taking omeprazole due to fear of causing more memory problems.  Reports her acid reflux has been well-controlled with diet.

## 2023-01-09 NOTE — Assessment & Plan Note (Signed)
Managed by pulmonology.  Patient has not seen pulmonology since last year.  Provided information to call and schedule follow-up appointment.

## 2023-01-10 LAB — COMPLETE METABOLIC PANEL WITH GFR
AG Ratio: 1.1 (calc) (ref 1.0–2.5)
ALT: 8 U/L (ref 6–29)
AST: 12 U/L (ref 10–35)
Albumin: 3.9 g/dL (ref 3.6–5.1)
Alkaline phosphatase (APISO): 103 U/L (ref 37–153)
BUN: 13 mg/dL (ref 7–25)
CO2: 29 mmol/L (ref 20–32)
Calcium: 9.5 mg/dL (ref 8.6–10.4)
Chloride: 104 mmol/L (ref 98–110)
Creat: 0.73 mg/dL (ref 0.50–1.05)
Globulin: 3.4 g/dL (ref 1.9–3.7)
Glucose, Bld: 96 mg/dL (ref 65–99)
Potassium: 4.5 mmol/L (ref 3.5–5.3)
Sodium: 139 mmol/L (ref 135–146)
Total Bilirubin: 0.3 mg/dL (ref 0.2–1.2)
Total Protein: 7.3 g/dL (ref 6.1–8.1)
eGFR: 93 mL/min/{1.73_m2} (ref >=60)

## 2023-01-10 LAB — CBC WITH DIFFERENTIAL/PLATELET
Absolute Lymphocytes: 2041 {cells}/uL (ref 850–3900)
Absolute Monocytes: 428 {cells}/uL (ref 200–950)
Basophils Absolute: 69 {cells}/uL (ref 0–200)
Basophils Relative: 1.1 %
Eosinophils Absolute: 32 {cells}/uL (ref 15–500)
Eosinophils Relative: 0.5 %
HCT: 30.9 % — ABNORMAL LOW (ref 35.0–45.0)
Hemoglobin: 8.4 g/dL — ABNORMAL LOW (ref 11.7–15.5)
MCH: 18.9 pg — ABNORMAL LOW (ref 27.0–33.0)
MCHC: 27.2 g/dL — ABNORMAL LOW (ref 32.0–36.0)
MCV: 69.6 fL — ABNORMAL LOW (ref 80.0–100.0)
MPV: 10.4 fL (ref 7.5–12.5)
Monocytes Relative: 6.8 %
Neutro Abs: 3730 {cells}/uL (ref 1500–7800)
Neutrophils Relative %: 59.2 %
Platelets: 468 10*3/uL — ABNORMAL HIGH (ref 140–400)
RBC: 4.44 10*6/uL (ref 3.80–5.10)
RDW: 17.8 % — ABNORMAL HIGH (ref 11.0–15.0)
Total Lymphocyte: 32.4 %
WBC: 6.3 10*3/uL (ref 3.8–10.8)

## 2023-01-10 LAB — CERVICOVAGINAL ANCILLARY ONLY
Bacterial Vaginitis (gardnerella): POSITIVE — AB
Candida Glabrata: NEGATIVE
Candida Vaginitis: NEGATIVE
Chlamydia: NEGATIVE
Comment: NEGATIVE
Comment: NEGATIVE
Comment: NEGATIVE
Comment: NEGATIVE
Comment: NEGATIVE
Comment: NORMAL
Neisseria Gonorrhea: NEGATIVE
Trichomonas: NEGATIVE

## 2023-01-10 LAB — CBC MORPHOLOGY

## 2023-01-10 LAB — IRON,TIBC AND FERRITIN PANEL
%SAT: 3 % — ABNORMAL LOW (ref 16–45)
Ferritin: 3 ng/mL — ABNORMAL LOW (ref 16–288)
Iron: 11 ug/dL — ABNORMAL LOW (ref 45–160)
TIBC: 372 ug/dL (ref 250–450)

## 2023-01-10 LAB — B12 AND FOLATE PANEL
Folate: 10.2 ng/mL
Vitamin B-12: 340 pg/mL (ref 200–1100)

## 2023-01-10 LAB — TSH: TSH: 0.26 m[IU]/L — ABNORMAL LOW (ref 0.40–4.50)

## 2023-01-11 ENCOUNTER — Other Ambulatory Visit: Payer: Self-pay | Admitting: Nurse Practitioner

## 2023-01-11 DIAGNOSIS — B9689 Other specified bacterial agents as the cause of diseases classified elsewhere: Secondary | ICD-10-CM

## 2023-01-11 MED ORDER — METRONIDAZOLE 500 MG PO TABS: 500.0000 mg | ORAL_TABLET | Freq: Three times a day (TID) | ORAL | 0 refills | Status: AC

## 2023-01-18 ENCOUNTER — Other Ambulatory Visit: Payer: Self-pay | Admitting: Family Medicine

## 2023-01-18 DIAGNOSIS — F339 Major depressive disorder, recurrent, unspecified: Secondary | ICD-10-CM

## 2023-02-07 ENCOUNTER — Other Ambulatory Visit: Payer: Self-pay | Admitting: Nurse Practitioner

## 2023-02-07 DIAGNOSIS — L299 Pruritus, unspecified: Secondary | ICD-10-CM

## 2023-02-07 NOTE — Telephone Encounter (Signed)
Requested Prescriptions  Pending Prescriptions Disp Refills   hydrOXYzine (VISTARIL) 25 MG capsule [Pharmacy Med Name: HYDROXYZINE PAMOATE 25MG  CAPSULES] 30 capsule 0    Sig: TAKE 1 CAPSULE(25 MG) BY MOUTH AT BEDTIME AS NEEDED     Ear, Nose, and Throat:  Antihistamines 2 Passed - 02/07/2023 10:06 AM      Passed - Cr in normal range and within 360 days    Creat  Date Value Ref Range Status  01/09/2023 0.73 0.50 - 1.05 mg/dL Final         Passed - Valid encounter within last 12 months    Recent Outpatient Visits           4 weeks ago Vaginal discharge   [facility] Della Goo F, FNP   5 months ago Iron deficiency anemia secondary to inadequate dietary iron intake   [facility] Alba Cory, MD   8 months ago Morbid obesity [facility])   [place] [facility] Alba Cory, MD   9 months ago Right lower quadrant abdominal pain   [facility] Alba Cory, MD   1 year ago Major depression, recurrent, chronic [facility])   [facility] Alba Cory, MD       Future Appointments             In 2 months Carlynn Purl, Danna Hefty, MD [facility]

## 2023-02-13 ENCOUNTER — Ambulatory Visit: Payer: 59 | Admitting: Family Medicine

## 2023-02-19 ENCOUNTER — Telehealth: Payer: Self-pay | Admitting: Oncology

## 2023-02-19 ENCOUNTER — Other Ambulatory Visit: Payer: Self-pay | Admitting: *Deleted

## 2023-02-19 DIAGNOSIS — D508 Other iron deficiency anemias: Secondary | ICD-10-CM

## 2023-02-19 NOTE — Telephone Encounter (Signed)
Pt called and wants to make an appt with MD. She was last seen in March of 2023. Please advise on scheduling and call pt with updates.

## 2023-02-26 ENCOUNTER — Inpatient Hospital Stay: Payer: 59 | Attending: Oncology

## 2023-02-26 DIAGNOSIS — D509 Iron deficiency anemia, unspecified: Secondary | ICD-10-CM | POA: Insufficient documentation

## 2023-02-26 DIAGNOSIS — D508 Other iron deficiency anemias: Secondary | ICD-10-CM

## 2023-02-26 LAB — CBC WITH DIFFERENTIAL/PLATELET
Abs Immature Granulocytes: 0.03 10*3/uL (ref 0.00–0.07)
Basophils Absolute: 0.1 10*3/uL (ref 0.0–0.1)
Basophils Relative: 1 %
Eosinophils Absolute: 0 10*3/uL (ref 0.0–0.5)
Eosinophils Relative: 1 %
HCT: 26.3 % — ABNORMAL LOW (ref 36.0–46.0)
Hemoglobin: 7.3 g/dL — ABNORMAL LOW (ref 12.0–15.0)
Immature Granulocytes: 0 %
Lymphocytes Relative: 35 %
Lymphs Abs: 2.5 10*3/uL (ref 0.7–4.0)
MCH: 18.8 pg — ABNORMAL LOW (ref 26.0–34.0)
MCHC: 27.8 g/dL — ABNORMAL LOW (ref 30.0–36.0)
MCV: 67.8 fL — ABNORMAL LOW (ref 80.0–100.0)
Monocytes Absolute: 0.5 10*3/uL (ref 0.1–1.0)
Monocytes Relative: 7 %
Neutro Abs: 4 10*3/uL (ref 1.7–7.7)
Neutrophils Relative %: 56 %
Platelets: 446 10*3/uL — ABNORMAL HIGH (ref 150–400)
RBC: 3.88 MIL/uL (ref 3.87–5.11)
RDW: 17.9 % — ABNORMAL HIGH (ref 11.5–15.5)
WBC: 7.2 10*3/uL (ref 4.0–10.5)
nRBC: 0 % (ref 0.0–0.2)

## 2023-02-26 LAB — IRON AND TIBC
Iron: 11 ug/dL — ABNORMAL LOW (ref 28–170)
Saturation Ratios: 2 % — ABNORMAL LOW (ref 10.4–31.8)
TIBC: 462 ug/dL — ABNORMAL HIGH (ref 250–450)
UIBC: 451 ug/dL

## 2023-02-26 LAB — SAMPLE TO BLOOD BANK

## 2023-02-26 LAB — FERRITIN: Ferritin: 2 ng/mL — ABNORMAL LOW (ref 11–307)

## 2023-02-27 ENCOUNTER — Encounter: Payer: Self-pay | Admitting: Oncology

## 2023-02-27 ENCOUNTER — Inpatient Hospital Stay (HOSPITAL_BASED_OUTPATIENT_CLINIC_OR_DEPARTMENT_OTHER): Payer: 59 | Admitting: Oncology

## 2023-02-27 ENCOUNTER — Inpatient Hospital Stay: Payer: 59

## 2023-02-27 VITALS — BP 137/73 | HR 76 | Temp 96.4°F | Resp 18 | Ht 68.0 in | Wt 215.0 lb

## 2023-02-27 VITALS — BP 113/62 | HR 70 | Temp 97.6°F | Resp 19

## 2023-02-27 DIAGNOSIS — D508 Other iron deficiency anemias: Secondary | ICD-10-CM

## 2023-02-27 DIAGNOSIS — D509 Iron deficiency anemia, unspecified: Secondary | ICD-10-CM | POA: Diagnosis not present

## 2023-02-27 LAB — PREPARE RBC (CROSSMATCH)

## 2023-02-27 MED ORDER — SODIUM CHLORIDE 0.9% FLUSH
10.0000 mL | Freq: Once | INTRAVENOUS | Status: AC | PRN
  Administered 2023-02-27: 10 mL
  Filled 2023-02-27: qty 10

## 2023-02-27 MED ORDER — IRON SUCROSE 20 MG/ML IV SOLN
200.0000 mg | Freq: Once | INTRAVENOUS | Status: AC
  Administered 2023-02-27: 200 mg via INTRAVENOUS
  Filled 2023-02-27: qty 10

## 2023-02-27 NOTE — Progress Notes (Unsigned)
[facility]  [phone] [phone]  ID: Matilde Haymaker OB: 11/06/1960  MR#: [id number]  KVQ#:[id number]  Patient Care Team: Alba Cory, MD as PCP - General (Family Medicine) Antonieta Iba, MD as Consulting Physician (Cardiology) Lonell Face, MD as Consulting Physician (Neurology) Humberto Leep, MD as Referring Physician (Rheumatology) Jeralyn Ruths, MD as Consulting Physician (Oncology) Ramsay, Sharen Heck, MD as Referring Physician (Internal Medicine) Vincente Poli, MD as Referring Physician (Pulmonary Disease)  CHIEF COMPLAINT: Iron deficiency anemia.  INTERVAL HISTORY: Patient returns to clinic today for repeat laboratory, further evaluation, and consideration of additional IV Venofer.  She has noticed significant increasing weakness and fatigue as well as occasional dizziness.  She has no other neurologic complaints.  She denies any recent fevers or illnesses.  She has a good appetite and denies weight loss.  She has no chest pain, shortness of breath, cough, or hemoptysis.  She denies any nausea, vomiting, constipation, or diarrhea.  She has no urinary complaints.  Patient offers no further specific complaints today.  REVIEW OF SYSTEMS:   Review of Systems  Constitutional:  Positive for malaise/fatigue. Negative for fever and weight loss.  Respiratory: Negative.  Negative for cough, hemoptysis and shortness of breath.   Cardiovascular: Negative.  Negative for chest pain and leg swelling.  Gastrointestinal: Negative.  Negative for abdominal pain, blood in stool and melena.  Genitourinary: Negative.  Negative for hematuria.  Musculoskeletal: Negative.  Negative for back pain.  Skin: Negative.  Negative for rash.  Neurological:  Positive for dizziness and weakness. Negative for focal weakness and headaches.  Psychiatric/Behavioral: Negative.  The patient is not nervous/anxious.     As per HPI. Otherwise, a  complete review of systems is negative.  PAST MEDICAL HISTORY: Past Medical History:  Diagnosis Date   Allergy    Arthritis    Depression    Erythrodermic psoriasis    Fibromyalgia    GERD (gastroesophageal reflux disease)    Hyperlipemia    Insomnia    Obesity    Recurrent HSV (herpes simplex virus)    Sleep apnea    pt states does not use CPAP   Thyroid disease    Vitamin D deficiency     PAST SURGICAL HISTORY: Past Surgical History:  Procedure Laterality Date   ABDOMINAL HYSTERECTOMY  [id number]   bladder tact     BREAST BIOPSY Right 09/02/2015   US Guided biopsy - Ribbon shaped marker - negative   CHOLECYSTECTOMY  [id number]   COLONOSCOPY     COLONOSCOPY WITH PROPOFOL N/A 02/10/2016   Procedure: COLONOSCOPY WITH PROPOFOL;  Surgeon: Midge Minium, MD;  Location: [facility];  Service: Endoscopy;  Laterality: N/A;   ESOPHAGOGASTRODUODENOSCOPY     ESOPHAGOGASTRODUODENOSCOPY (EGD) WITH PROPOFOL N/A 02/10/2016   Procedure: ESOPHAGOGASTRODUODENOSCOPY (EGD) WITH PROPOFOL;  Surgeon: Midge Minium, MD;  Location: [facility];  Service: Endoscopy;  Laterality: N/A;  sleep apnea   LIPOMA EXCISION     POLYPECTOMY  02/10/2016   Procedure: POLYPECTOMY;  Surgeon: Midge Minium, MD;  Location: [facility];  Service: Endoscopy;;   TONSILECTOMY, ADENOIDECTOMY, BILATERAL MYRINGOTOMY AND TUBES      FAMILY HISTORY: Family History  Problem Relation Age of Onset   Hypertension Mother    Arthritis Mother    Cancer Mother        ovarian,breast, lung, liver   Breast cancer Mother 55   Ovarian cancer Mother 43   Lung cancer Mother 22   Colon  cancer Brother    Other Father        lung problems   Ovarian cancer Maternal Grandmother        pt thinks is was ovarian but not sure   Heart disease Brother    Cancer Maternal Grandfather     ADVANCED DIRECTIVES (Y/N):  N  HEALTH MAINTENANCE: Social History   Tobacco Use   Smoking status: Never   Smokeless tobacco:  Never  Vaping Use   Vaping status: Never Used  Substance Use Topics   Alcohol use: Yes    Alcohol/week: 0.0 standard drinks of alcohol    Comment: rarely   Drug use: No     Colonoscopy:  PAP:  Bone density:  Lipid panel:  No Known Allergies  Current Outpatient Medications  Medication Sig Dispense Refill   levothyroxine (SYNTHROID) 200 MCG tablet Take 1 tablet (200 mcg total) by mouth daily before breakfast. 90 tablet 1   omeprazole (PRILOSEC) 10 MG capsule Take 10 mg by mouth daily.     amphetamine-dextroamphetamine (ADDERALL XR) 15 MG 24 hr capsule Take 1 capsule by mouth every morning. (Patient not taking: Reported on 02/27/2023) 30 capsule 0   amphetamine-dextroamphetamine (ADDERALL XR) 15 MG 24 hr capsule Take 1 capsule by mouth every morning. (Patient not taking: Reported on 02/27/2023) 30 capsule 0   amphetamine-dextroamphetamine (ADDERALL XR) 15 MG 24 hr capsule Take 1 capsule by mouth every morning. (Patient not taking: Reported on 02/27/2023) 30 capsule 0   Ca Phosphate-Cholecalciferol 200-200 MG-UNIT CHEW Chew 1 each by mouth daily. (Patient not taking: Reported on 02/27/2023)     folic acid (FOLVITE) 1 MG tablet Take 1 tablet (1 mg total) by mouth daily. (Patient not taking: Reported on 02/27/2023) 100 tablet 1   hydrOXYzine (VISTARIL) 25 MG capsule TAKE 1 CAPSULE(25 MG) BY MOUTH AT BEDTIME AS NEEDED (Patient not taking: Reported on 02/27/2023) 30 capsule 0   rosuvastatin (CRESTOR) 5 MG tablet Take 1 tablet (5 mg total) by mouth daily. (Patient not taking: Reported on 02/27/2023) 90 tablet 1   valACYclovir (VALTREX) 1000 MG tablet Take 1 tablet (1,000 mg total) by mouth 2 (two) times daily. (Patient not taking: Reported on 02/27/2023) 30 tablet 0   No current facility-administered medications for this visit.    OBJECTIVE: Vitals:   02/27/23 1107  BP: 137/73  Pulse: 76  Resp: 18  Temp: (!) 96.4 F (35.8 C)  SpO2: 100%     Body mass index is 32.69 kg/m.    ECOG  FS:1 - Symptomatic but completely ambulatory  General: Well-developed, well-nourished, no acute distress. Eyes: Pink conjunctiva, anicteric sclera. HEENT: Normocephalic, moist mucous membranes. Lungs: No audible wheezing or coughing. Heart: Regular rate and rhythm. Abdomen: Soft, nontender, no obvious distention. Musculoskeletal: No edema, cyanosis, or clubbing. Neuro: Alert, answering all questions appropriately. Cranial nerves grossly intact. Skin: No rashes or petechiae noted. Psych: Normal affect.  LAB RESULTS:  Lab Results  Component Value Date   NA 139 01/09/2023   K 4.5 01/09/2023   CL 104 01/09/2023   CO2 29 01/09/2023   GLUCOSE 96 01/09/2023   BUN 13 01/09/2023   CREATININE 0.73 01/09/2023   CALCIUM 9.5 01/09/2023   PROT 7.3 01/09/2023   ALBUMIN 4.0 04/21/2022   AST 12 01/09/2023   ALT 8 01/09/2023   ALKPHOS 121 04/21/2022   BILITOT 0.3 01/09/2023   GFRNONAA 54 (L) 04/21/2022   GFRAA 74 10/17/2019    Lab Results  Component Value Date   WBC  7.2 02/26/2023   NEUTROABS 4.0 02/26/2023   HGB 7.3 (L) 02/26/2023   HCT 26.3 (L) 02/26/2023   MCV 67.8 (L) 02/26/2023   PLT 446 (H) 02/26/2023   Lab Results  Component Value Date   IRON 11 (L) 02/26/2023   TIBC 462 (H) 02/26/2023   IRONPCTSAT 2 (L) 02/26/2023   Lab Results  Component Value Date   FERRITIN 2 (L) 02/26/2023     STUDIES: No results found.  ASSESSMENT: Iron deficiency anemia.  PLAN:    Iron deficiency anemia: Patient underwent colonoscopy on March 17, 2021 that did not report any significant pathology.  Patient's hemoglobin and iron stores have significantly declined and she is symptomatic.  Will proceed with 200 mg IV Venofer today.  Patient will also return to clinic tomorrow for 1 unit of packed red blood cells.  She will then return to clinic 3 more times over the next several weeks to receive IV Venofer.  Return to clinic in 3 months with repeat laboratory, further evaluation, and  continuation of treatment if necessary.  I spent a total of 30 minutes reviewing chart data, face-to-face evaluation with the patient, counseling and coordination of care as detailed above.    Patient expressed understanding and was in agreement with this plan. She also understands that She can call clinic at any time with any questions, concerns, or complaints.    Jeralyn Ruths, MD   02/28/2023 8:48 AM

## 2023-02-27 NOTE — Progress Notes (Unsigned)
Patient is very dizzy today. Was very off balance walking to room. States she gets this way with low hemoglobin which is 7.3 as of yesterday. States she is having numbness in the right side of her face.

## 2023-02-28 ENCOUNTER — Encounter: Payer: Self-pay | Admitting: Oncology

## 2023-02-28 ENCOUNTER — Inpatient Hospital Stay: Payer: 59

## 2023-02-28 DIAGNOSIS — D509 Iron deficiency anemia, unspecified: Secondary | ICD-10-CM | POA: Diagnosis not present

## 2023-02-28 DIAGNOSIS — D508 Other iron deficiency anemias: Secondary | ICD-10-CM

## 2023-02-28 MED ORDER — ACETAMINOPHEN 325 MG PO TABS
650.0000 mg | ORAL_TABLET | Freq: Once | ORAL | Status: AC
Start: 2023-02-28 — End: 2023-02-28
  Administered 2023-02-28: 650 mg via ORAL
  Filled 2023-02-28: qty 2

## 2023-02-28 MED ORDER — SODIUM CHLORIDE 0.9% IV SOLUTION
250.0000 mL | INTRAVENOUS | Status: DC
  Administered 2023-02-28 (×2): 250 mL via INTRAVENOUS
  Filled 2023-02-28: qty 250

## 2023-02-28 MED ORDER — SODIUM CHLORIDE 0.9% FLUSH
10.0000 mL | Freq: Once | INTRAVENOUS | Status: AC
  Administered 2023-02-28: 10 mL via INTRAVENOUS
  Filled 2023-02-28: qty 10

## 2023-02-28 MED ORDER — DIPHENHYDRAMINE HCL 50 MG/ML IJ SOLN
25.0000 mg | Freq: Once | INTRAMUSCULAR | Status: AC
  Administered 2023-02-28: 25 mg via INTRAVENOUS
  Filled 2023-02-28: qty 1

## 2023-02-28 NOTE — Patient Instructions (Signed)
Blood Transfusion, Adult A blood transfusion is a procedure in which you receive blood through an IV tube. You may need this procedure because of: A bleeding disorder. An illness. An injury. A surgery. The blood may come from someone else (a donor). You may also be able to donate blood for yourself before a surgery. The blood given in a transfusion may be made up of different types of cells. You may get: Red blood cells. These carry oxygen to the cells in the body. Platelets. These help your blood to clot. Plasma. This is the liquid part of your blood. It carries proteins and other substances through the body. White blood cells. These help you fight infections. If you have a clotting disorder, you may also get other types of blood products. Depending on the type of blood product, this procedure may take 1-4 hours to complete. Tell your doctor about: Any bleeding problems you have. Any reactions you have had during a blood transfusion in the past. Any allergies you have. All medicines you are taking, including vitamins, herbs, eye drops, creams, and over-the-counter medicines. Any surgeries you have had. Any medical conditions you have. Whether you are pregnant or may be pregnant. What are the risks? Talk with your health care provider about risks. The most common problems include: A mild allergic reaction. This includes red, swollen areas of skin (hives) and itching. Fever or chills. This may be the body's response to new blood cells received. This may happen during or up to 4 hours after the transfusion. More serious problems may include: A serious allergic reaction. This includes breathing trouble or swelling around the face and lips. Too much fluid in the lungs. This may cause breathing problems. Lung injury. This causes breathing trouble and low oxygen in the blood. This can happen within hours of the transfusion or days later. Too much iron. This can happen after getting many blood  transfusions over a period of time. An infection or virus passed through the blood. This is rare. Donated blood is carefully tested before it is given. Your body's defense system (immune system) trying to attack the new blood cells. This is rare. Symptoms may include fever, chills, nausea, low blood pressure, and low back or chest pain. Donated cells attacking healthy tissues. This is rare. What happens before the procedure? You will have a blood test to find out your blood type. The test also finds out what type of blood your body will accept and matches it to the donor type. If you are going to have a planned surgery, you may be able to donate your own blood. This may be done in case you need a transfusion. You will have your temperature, blood pressure, and pulse checked. You may receive medicine to help prevent an allergic reaction. This may be done if you have had a reaction to a transfusion before. This medicine may be given to you by mouth or through an IV tube. What happens during the procedure?  An IV tube will be put into one of your veins. The bag of blood will be attached to your IV tube. Then, the blood will enter through your vein. Your temperature, blood pressure, and pulse will be checked often. This is done to find early signs of a transfusion reaction. Tell your nurse right away if you have any of these symptoms: Shortness of breath or trouble breathing. Chest or back pain. Fever or chills. Red, swollen areas of skin or itching. If you have any signs   or symptoms of a reaction, your transfusion will be stopped. You may also be given medicine. When the transfusion is finished, your IV tube will be taken out. Pressure may be put on the IV site for a few minutes. A bandage (dressing) will be put on the IV site. The procedure may vary among doctors and hospitals. What happens after the procedure? You will be monitored until you leave the hospital or clinic. This includes  checking your temperature, blood pressure, pulse, breathing rate, and blood oxygen level. Your blood may be tested to see how you have responded to the transfusion. You may be warmed with fluids or blankets. This is done to keep the temperature of your body normal. If you have your procedure in an outpatient setting, you will be told whom to contact to report any reactions. Where to find more information Visit the [employer]: redcross.org Summary A blood transfusion is a procedure in which you receive blood through an IV tube. The blood you are given may be made up of different blood cells. You may receive red blood cells, platelets, plasma, or white blood cells. Your temperature, blood pressure, and pulse will be checked often. After the procedure, your blood may be tested to see how you have responded. This information is not intended to replace advice given to you by your health care provider. Make sure you discuss any questions you have with your health care provider. Document Revised: 05/27/2021 Document Reviewed: 05/27/2021 Elsevier Patient Education  2024 Elsevier Inc.  

## 2023-03-01 LAB — TYPE AND SCREEN
ABO/RH(D): A POS
Antibody Screen: NEGATIVE
Unit division: 0

## 2023-03-01 LAB — BPAM RBC
Blood Product Expiration Date: 202501122359
ISSUE DATE / TIME: 202412181150
Unit Type and Rh: 202501122359
Unit Type and Rh: 6200

## 2023-03-05 ENCOUNTER — Inpatient Hospital Stay: Payer: 59

## 2023-03-05 VITALS — BP 133/73 | HR 68 | Temp 96.3°F | Resp 18

## 2023-03-05 DIAGNOSIS — D509 Iron deficiency anemia, unspecified: Secondary | ICD-10-CM

## 2023-03-05 MED ORDER — SODIUM CHLORIDE 0.9% FLUSH
10.0000 mL | Freq: Once | INTRAVENOUS | Status: AC | PRN
  Administered 2023-03-05: 10 mL
  Filled 2023-03-05: qty 10

## 2023-03-05 MED ORDER — IRON SUCROSE 20 MG/ML IV SOLN
200.0000 mg | Freq: Once | INTRAVENOUS | Status: AC
Start: 2023-03-05 — End: 2023-03-05
  Administered 2023-03-05: 200 mg via INTRAVENOUS
  Filled 2023-03-05: qty 10

## 2023-03-09 ENCOUNTER — Other Ambulatory Visit: Payer: Self-pay | Admitting: Nurse Practitioner

## 2023-03-09 DIAGNOSIS — L299 Pruritus, unspecified: Secondary | ICD-10-CM

## 2023-03-12 ENCOUNTER — Inpatient Hospital Stay: Payer: 59

## 2023-03-12 VITALS — BP 136/79 | HR 81 | Temp 97.1°F | Resp 19

## 2023-03-12 DIAGNOSIS — D509 Iron deficiency anemia, unspecified: Secondary | ICD-10-CM | POA: Diagnosis not present

## 2023-03-12 MED ORDER — SODIUM CHLORIDE 0.9% FLUSH
10.0000 mL | Freq: Once | INTRAVENOUS | Status: AC | PRN
Start: 2023-03-12 — End: 2023-03-12
  Administered 2023-03-12: 10 mL
  Filled 2023-03-12: qty 10

## 2023-03-12 MED ORDER — IRON SUCROSE 20 MG/ML IV SOLN
200.0000 mg | Freq: Once | INTRAVENOUS | Status: AC
  Administered 2023-03-12: 200 mg via INTRAVENOUS

## 2023-03-13 NOTE — Telephone Encounter (Signed)
 Requested medications are due for refill today.  yes  Requested medications are on the active medications list.  yes  Last refill. 02/07/2023 #30 0 rf  Future visit scheduled.   yes  Notes to clinic.  Med marked as no longer taking -  please advise.    Requested Prescriptions  Pending Prescriptions Disp Refills   hydrOXYzine  (VISTARIL ) 25 MG capsule [Pharmacy Med Name: HYDROXYZINE  PAMOATE 25MG  CAPSULES] 30 capsule 0    Sig: TAKE 1 CAPSULE(25 MG) BY MOUTH AT BEDTIME AS NEEDED     Ear, Nose, and Throat:  Antihistamines 2 Passed - 03/13/2023  5:47 PM      Passed - Cr in normal range and within 360 days    Creat  Date Value Ref Range Status  01/09/2023 0.73 0.50 - 1.05 mg/dL Final         Passed - Valid encounter within last 12 months    Recent Outpatient Visits           2 months ago Vaginal discharge   [facility] Gareth Mliss FALCON, FNP   6 months ago Iron  deficiency anemia secondary to inadequate dietary iron  intake   [facility] Glenard Mire, MD   9 months ago Morbid obesity [facility])   [place] [facility] Glenard Mire, MD   10 months ago Right lower quadrant abdominal pain   [place] [facility] Sowles, Krichna, MD   1 year ago Major depression, recurrent, chronic [facility])   [place] [facility] Sowles, Krichna, MD       Future Appointments             In 1 month Glenard, Krichna, MD [facility], [place]

## 2023-03-19 ENCOUNTER — Inpatient Hospital Stay: Payer: 59 | Attending: Oncology

## 2023-03-19 VITALS — BP 120/75 | HR 80 | Temp 97.4°F | Resp 18

## 2023-03-19 DIAGNOSIS — D509 Iron deficiency anemia, unspecified: Secondary | ICD-10-CM | POA: Diagnosis present

## 2023-03-19 MED ORDER — IRON SUCROSE 20 MG/ML IV SOLN
200.0000 mg | Freq: Once | INTRAVENOUS | Status: AC
  Administered 2023-03-19: 200 mg via INTRAVENOUS
  Filled 2023-03-19: qty 10

## 2023-03-19 MED ORDER — SODIUM CHLORIDE 0.9% FLUSH
10.0000 mL | Freq: Once | INTRAVENOUS | Status: AC | PRN
Start: 2023-03-19 — End: 2023-03-19
  Administered 2023-03-19: 10 mL
  Filled 2023-03-19: qty 10

## 2023-03-19 NOTE — Patient Instructions (Signed)
 Iron Sucrose Injection What is this medication? IRON SUCROSE (EYE ern SOO krose) treats low levels of iron (iron deficiency anemia) in people with kidney disease. Iron is a mineral that plays an important role in making red blood cells, which carry oxygen from your lungs to the rest of your body. This medicine may be used for other purposes; ask your health care provider or pharmacist if you have questions. COMMON BRAND NAME(S): Venofer What should I tell my care team before I take this medication? They need to know if you have any of these conditions: Anemia not caused by low iron levels Heart disease High levels of iron in the blood Kidney disease Liver disease An unusual or allergic reaction to iron, other medications, foods, dyes, or preservatives Pregnant or trying to get pregnant Breastfeeding How should I use this medication? This medication is for infusion into a vein. It is given in a hospital or clinic setting. Talk to your care team about the use of this medication in children. While this medication may be prescribed for children as young as 2 years for selected conditions, precautions do apply. Overdosage: If you think you have taken too much of this medicine contact a poison control center or emergency room at once. NOTE: This medicine is only for you. Do not share this medicine with others. What if I miss a dose? Keep appointments for follow-up doses. It is important not to miss your dose. Call your care team if you are unable to keep an appointment. What may interact with this medication? Do not take this medication with any of the following: Deferoxamine Dimercaprol Other iron products This medication may also interact with the following: Chloramphenicol Deferasirox This list may not describe all possible interactions. Give your health care provider a list of all the medicines, herbs, non-prescription drugs, or dietary supplements you use. Also tell them if you smoke,  drink alcohol, or use illegal drugs. Some items may interact with your medicine. What should I watch for while using this medication? Visit your care team regularly. Tell your care team if your symptoms do not start to get better or if they get worse. You may need blood work done while you are taking this medication. You may need to follow a special diet. Talk to your care team. Foods that contain iron include: whole grains/cereals, dried fruits, beans, or peas, leafy green vegetables, and organ meats (liver, kidney). What side effects may I notice from receiving this medication? Side effects that you should report to your care team as soon as possible: Allergic reactions--skin rash, itching, hives, swelling of the face, lips, tongue, or throat Low blood pressure--dizziness, feeling faint or lightheaded, blurry vision Shortness of breath Side effects that usually do not require medical attention (report to your care team if they continue or are bothersome): Flushing Headache Joint pain Muscle pain Nausea Pain, redness, or irritation at injection site This list may not describe all possible side effects. Call your doctor for medical advice about side effects. You may report side effects to FDA at 1-800-FDA-1088. Where should I keep my medication? This medication is given in a hospital or clinic. It will not be stored at home. NOTE: This sheet is a summary. It may not cover all possible information. If you have questions about this medicine, talk to your doctor, pharmacist, or health care provider.  2024 Elsevier/Gold Standard (2022-08-04 00:00:00)

## 2023-04-17 ENCOUNTER — Encounter: Payer: Self-pay | Admitting: Family Medicine

## 2023-04-17 ENCOUNTER — Ambulatory Visit: Payer: 59 | Admitting: Family Medicine

## 2023-04-17 VITALS — BP 118/76 | HR 97 | Temp 97.8°F | Resp 16 | Ht 68.0 in | Wt 214.7 lb

## 2023-04-17 DIAGNOSIS — J849 Interstitial pulmonary disease, unspecified: Secondary | ICD-10-CM | POA: Diagnosis not present

## 2023-04-17 DIAGNOSIS — Z6832 Body mass index (BMI) 32.0-32.9, adult: Secondary | ICD-10-CM

## 2023-04-17 DIAGNOSIS — M797 Fibromyalgia: Secondary | ICD-10-CM

## 2023-04-17 DIAGNOSIS — F339 Major depressive disorder, recurrent, unspecified: Secondary | ICD-10-CM

## 2023-04-17 DIAGNOSIS — D509 Iron deficiency anemia, unspecified: Secondary | ICD-10-CM

## 2023-04-17 DIAGNOSIS — I700 Atherosclerosis of aorta: Secondary | ICD-10-CM

## 2023-04-17 DIAGNOSIS — K219 Gastro-esophageal reflux disease without esophagitis: Secondary | ICD-10-CM

## 2023-04-17 DIAGNOSIS — E039 Hypothyroidism, unspecified: Secondary | ICD-10-CM

## 2023-04-17 DIAGNOSIS — M3500 Sicca syndrome, unspecified: Secondary | ICD-10-CM

## 2023-04-17 DIAGNOSIS — E538 Deficiency of other specified B group vitamins: Secondary | ICD-10-CM

## 2023-04-17 NOTE — Progress Notes (Signed)
 Name: Debra Soto   MRN: [id number]    DOB: 07-17-1960   Date:04/17/2023       Progress Note  Subjective  Chief Complaint  Chief Complaint  Patient presents with   Medical Management of Chronic Issues   HPI   Interstitial Lung Disease: seen by Dr. Geni 03/2019 for SOB and abnormal CT chest 02/2019 , showed some granulomatous disease also a nodule, she had the last CT done 05/2021. She is due for a repeat. , She states SOB only with moderate activity, she also  has a chronic cough. Last visit with pulmonologist was 11/2021 Dr. Allene , she noticed improvement of SOB with improvement of anemia    Memory changes and family history of Alzheimer's: she needs to write everything down, she has noticed it is getting worse, does not seem to be just her fibromyalgia fog.  She states she needs to have direct questions to be able to answer. We referred her to Dr. Maree but did not see him. She states she is doing better    Positive ANA and myalgia/Sicca : seen Dr. Maree - Rheumatologist at [facility] but also lost to follow up. CRP has been elevated . Explained importance of seeing her again . She has lost to follow up again    Hypothyroidism: she is on 200 mcg synthroid  one daily and  skipping Sundays, last TSH was not at goal. She lost weight since last visit about 15 lbs .   Major Depression: she has a long history of depression, but stopped all medications on her own in 2024, went through withdrawal but states doing well emotionally and does not want to resume anything    FMS: she stopped Lyrica  because it caused her to get moody. Stopped gabapentin because she was forgetting to take it, pain level today is 4/10.    Chronic low back pain:she continues to have lower back pain , not on medications - stopped taking gabapentin on her own, she is having stiffness and aching . Pain on her back is 5/10, goes up when she coughs or sneezes    MRI lumbar spine: done 06/05/2021 Degenerative disc and facet  disease are most pronounced in the lower lumbar spine resulting in mild foraminal stenosis bilaterally at L5-S1.     Obesity: BMI is below 35 again .She was  243 lbs early 2024 , it was 229 lbs in June and is now down to 214.7 lbs. She is under the care of Dr. Jacobo - hematologist/oncologist Mammogram is up to date - needs repeat in July   Atherosclerosis of Aorta/Dyslipidemia: she stopped taking statin therapy on her own    GERD and hiatal hernia: under the care of GI, had EGD and colonoscopy 2023. She has intermittent episodes of severe abdominal pain, CT abdomen showed hiatal hernia and atherosclerosis of arota. She states she continues to have lack of appetite, she continues to lose weight , another 15 lbs in the past 6 months. She states she is still eating small portions , stopped drinking sodas. She states she has bowel movements right after eating and gets nauseated intermittently. Occasionally takes prilosec   Anemia : iron  deficiency but also has folate and B12 deficiency, hemoglobin was down to 7.3 in Dec 2024, had to get a blood transfusion and 5 iron  infusions , she still feels dizzy but feeling much better now. SOB also improved    Patient Active Problem List   Diagnosis Date Noted   Memory problem 01/09/2023  Degeneration of intervertebral disc of lumbar region with discogenic back pain 01/09/2023   Family history of Alzheimer's disease 01/09/2023   Attention deficit hyperactivity disorder (ADHD), combined type 09/05/2022   Folic acid  deficiency 09/05/2022   Atherosclerosis of abdominal aorta (HCC) 05/30/2022   B12 deficiency 05/30/2022   Hiatal hernia without gangrene and obstruction 05/30/2022   Sicca, unspecified type (HCC) 04/21/2022   Interstitial lung disease (HCC) 05/16/2021   Morbid obesity (HCC) 12/27/2018   Iron  deficiency anemia 11/06/2017   Essential (hemorrhagic) thrombocythemia (HCC)    Benign neoplasm of descending colon    Heartburn    Gastric polyp     Psoriasis 01/25/2016   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/22/2015   Endometriosis 12/31/2014   Insomnia, persistent 09/29/2014   IBS (irritable bowel syndrome) 09/29/2014   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/2014   Dyslipidemia 09/29/2014   Fibromyalgia syndrome 09/29/2014   Gastro-esophageal reflux disease without esophagitis 09/29/2014   Herpes labialis 09/29/2014   Adult hypothyroidism 09/29/2014   Family history of malignant neoplasm of breast 09/29/2014   Major depression, recurrent, chronic (HCC) 09/29/2014   Obstructive apnea 09/29/2014   Abnormal presence of protein in urine 09/29/2014   Allergic rhinitis 09/29/2014   Vitamin D  deficiency 09/29/2014   Urge incontinence 09/29/2014   Headache, tension-type 09/29/2014   Menopausal symptom 09/29/2014    Past Surgical History:  Procedure Laterality Date   ABDOMINAL HYSTERECTOMY  [id number]   bladder tact     BREAST BIOPSY Right 09/02/2015   US  Guided biopsy - Ribbon shaped marker - negative   CHOLECYSTECTOMY  [id number]   COLONOSCOPY     COLONOSCOPY WITH PROPOFOL  N/A 02/10/2016   Procedure: COLONOSCOPY WITH PROPOFOL ;  Surgeon: Rogelia Copping, MD;  Location: [facility];  Service: Endoscopy;  Laterality: N/A;   ESOPHAGOGASTRODUODENOSCOPY     ESOPHAGOGASTRODUODENOSCOPY (EGD) WITH PROPOFOL  N/A 02/10/2016   Procedure: ESOPHAGOGASTRODUODENOSCOPY (EGD) WITH PROPOFOL ;  Surgeon: Rogelia Copping, MD;  Location: [facility];  Service: Endoscopy;  Laterality: N/A;  sleep apnea   LIPOMA EXCISION     POLYPECTOMY  02/10/2016   Procedure: POLYPECTOMY;  Surgeon: Rogelia Copping, MD;  Location: [facility];  Service: Endoscopy;;   TONSILECTOMY, ADENOIDECTOMY, BILATERAL MYRINGOTOMY AND TUBES      Family History  Problem Relation Age of Onset   Hypertension Mother    Arthritis Mother    Cancer Mother        ovarian,breast, lung, liver   Breast cancer Mother 24   Ovarian cancer Mother 26   Lung cancer Mother 69   Colon cancer  Brother    Other Father        lung problems   Ovarian cancer Maternal Grandmother        pt thinks is was ovarian but not sure   Heart disease Brother    Cancer Maternal Grandfather     Social History   Tobacco Use   Smoking status: Never   Smokeless tobacco: Never  Substance Use Topics   Alcohol use: Yes    Alcohol/week: 0.0 standard drinks of alcohol    Comment: rarely     Current Outpatient Medications:    levothyroxine  (SYNTHROID ) 200 MCG tablet, Take 1 tablet (200 mcg total) by mouth daily before breakfast., Disp: 90 tablet, Rfl: 1   amphetamine -dextroamphetamine (ADDERALL XR) 15 MG 24 hr capsule, Take 1 capsule by mouth every morning. (Patient not taking: Reported on 04/17/2023), Disp: 30 capsule, Rfl: 0   amphetamine -dextroamphetamine (ADDERALL XR) 15 MG 24 hr capsule, Take 1  capsule by mouth every morning. (Patient not taking: Reported on 04/17/2023), Disp: 30 capsule, Rfl: 0   amphetamine -dextroamphetamine (ADDERALL XR) 15 MG 24 hr capsule, Take 1 capsule by mouth every morning. (Patient not taking: Reported on 04/17/2023), Disp: 30 capsule, Rfl: 0   Ca Phosphate-Cholecalciferol 200-200 MG-UNIT CHEW, Chew 1 each by mouth daily. (Patient not taking: Reported on 04/17/2023), Disp: , Rfl:    folic acid  (FOLVITE ) 1 MG tablet, Take 1 tablet (1 mg total) by mouth daily. (Patient not taking: Reported on 04/17/2023), Disp: 100 tablet, Rfl: 1   hydrOXYzine  (VISTARIL ) 25 MG capsule, TAKE 1 CAPSULE(25 MG) BY MOUTH AT BEDTIME AS NEEDED (Patient not taking: Reported on 04/17/2023), Disp: 30 capsule, Rfl: 0   omeprazole  (PRILOSEC) 10 MG capsule, Take 10 mg by mouth daily., Disp: , Rfl:    rosuvastatin  (CRESTOR ) 5 MG tablet, Take 1 tablet (5 mg total) by mouth daily. (Patient not taking: Reported on 04/17/2023), Disp: 90 tablet, Rfl: 1   valACYclovir  (VALTREX ) 1000 MG tablet, Take 1 tablet (1,000 mg total) by mouth 2 (two) times daily. (Patient not taking: Reported on 04/17/2023), Disp: 30 tablet, Rfl:  0  No Known Allergies  I personally reviewed active problem list, medication list, allergies, family history with the patient/caregiver today.   ROS  Ten systems reviewed and is negative except as mentioned in HPI    Objective  Vitals:   04/17/23 0842  BP: 118/76  Pulse: 97  Resp: 16  Temp: 97.8 F (36.6 C)  TempSrc: Oral  SpO2: 99%  Weight: 214 lb 11.2 oz (97.4 kg)  Height: 5' 8 (1.727 m)    Body mass index is 32.65 kg/m.  Physical Exam  Constitutional: Patient appears well-developed and well-nourished. Obese  No distress.  HEENT: head atraumatic, normocephalic, pupils equal and reactive to light,, neck supple Cardiovascular: Normal rate, regular rhythm and normal heart sounds.  No murmur heard. No BLE edema. Pulmonary/Chest: Effort normal and breath sounds normal. No respiratory distress. Abdominal: Soft.  There is no tenderness. Psychiatric: Patient has a normal mood and affect. behavior is normal. Judgment and thought content normal.   Recent Results (from the past 2160 hours)  Hold Tube- Blood Bank     Status: None   Collection Time: 02/26/23  1:14 PM  Result Value Ref Range   Blood Bank Specimen SAMPLE AVAILABLE FOR TESTING    Sample Expiration      03/01/2023,2359 Performed at [facility], [address] [place], [place] [address]   Iron  and TIBC([facility])     Status: Abnormal   Collection Time: 02/26/23  1:14 PM  Result Value Ref Range   Iron  11 (L) 28 - 170 ug/dL   TIBC 537 (H) 749 - 549 ug/dL   Saturation Ratios 2 (L) 10.4 - 31.8 %   UIBC 451 ug/dL    Comment: Performed at [facility], [address] [place], [place] [address]  CBC with Differential/Platelet     Status: Abnormal   Collection Time: 02/26/23  1:14 PM  Result Value Ref Range   WBC 7.2 4.0 - 10.5 K/uL   RBC 3.88 3.87 - 5.11 MIL/uL   Hemoglobin 7.3 (L) 12.0 - 15.0 g/dL    Comment: Reticulocyte Hemoglobin testing may be clinically indicated, consider  ordering this additional test [id number]    HCT 26.3 (L) 36.0 - 46.0 %   MCV 67.8 (L) 80.0 - 100.0 fL   MCH 18.8 (L) 26.0 - 34.0 pg   MCHC 27.8 (L)  30.0 - 36.0 g/dL   RDW 82.0 (H) 88.4 - 84.4 %   Platelets 446 (H) 150 - 400 K/uL   nRBC 0.0 0.0 - 0.2 %   Neutrophils Relative % 56 %   Neutro Abs 4.0 1.7 - 7.7 K/uL   Lymphocytes Relative 35 %   Lymphs Abs 2.5 0.7 - 4.0 K/uL   Monocytes Relative 7 %   Monocytes Absolute 0.5 0.1 - 1.0 K/uL   Eosinophils Relative 1 %   Eosinophils Absolute 0.0 0.0 - 0.5 K/uL   Basophils Relative 1 %   Basophils Absolute 0.1 0.0 - 0.1 K/uL   Immature Granulocytes 0 %   Abs Immature Granulocytes 0.03 0.00 - 0.07 K/uL    Comment: Performed at [facility], [address] [place], [place] [address]  Ferritin     Status: Abnormal   Collection Time: 02/26/23  1:14 PM  Result Value Ref Range   Ferritin 2 (L) 11 - 307 ng/mL    Comment: Performed at [facility], [address] [place], [place] [address]  Type and screen     Status: None   Collection Time: 02/26/23  1:14 PM  Result Value Ref Range   ABO/RH(D) A POS    Antibody Screen NEG    Sample Expiration 03/01/2023,2359    Unit Number [id number]    Blood Component Type RED CELLS,LR    Unit division 00    Status of Unit ISSUED,FINAL    Transfusion Status OK TO TRANSFUSE    Crossmatch Result      Compatible Performed at [facility], [address] [place], [place] [address]   BPAM RBC     Status: None   Collection Time: 02/26/23  1:14 PM  Result Value Ref Range   ISSUE DATE / TIME [id number]    Blood Product Unit Number [id number]    PRODUCT CODE [id number]    Unit Type and Rh 6200    Blood Product Expiration Date [id number]   Prepare RBC (crossmatch)     Status: None   Collection Time: 02/27/23 12:30 PM  Result Value Ref Range   Order Confirmation      ORDER PROCESSED BY BLOOD BANK Performed at [facility], [address] [place],  [place] [address]     Diabetic Foot Exam:     PHQ2/9:    04/17/2023    8:38 AM 01/09/2023    8:25 AM 01/09/2023    8:22 AM 09/05/2022    8:44 AM 05/30/2022    9:16 AM  Depression screen PHQ 2/9  Decreased Interest 0 0 0 0 0  Down, Depressed, Hopeless 0 0 0 0 0  PHQ - 2 Score 0 0 0 0 0  Altered sleeping 0 0  3 0  Tired, decreased energy 0 0  0 0  Change in appetite 0 0  0 0  Feeling bad or failure about yourself  0 0  0 0  Trouble concentrating 0 0  1 0  Moving slowly or fidgety/restless 0 0  0 0  Suicidal thoughts 0 0  0 0  PHQ-9 Score 0 0  4 0  Difficult doing work/chores Not difficult at all Not difficult at all       phq 9 is negative  Fall Risk:    04/17/2023    8:38 AM 01/09/2023    8:22 AM 09/05/2022    8:44 AM 05/30/2022    9:15 AM 04/21/2022   11:47 AM  Fall Risk   Falls in the past year? 0 0 0 0 0  Number falls in past yr: 0 0 0 0   Injury with Fall? 0 0 0 0   Risk for fall due to : No Fall Risks No Fall Risks No Fall Risks No Fall Risks No Fall Risks  Follow up Falls prevention discussed;Education provided;Falls evaluation completed Falls prevention discussed Falls prevention discussed Falls prevention discussed Falls prevention discussed     Assessment & Plan

## 2023-04-18 ENCOUNTER — Encounter: Payer: Self-pay | Admitting: Family Medicine

## 2023-04-18 LAB — TSH: TSH: 3.18 m[IU]/L (ref 0.40–4.50)

## 2023-05-25 ENCOUNTER — Other Ambulatory Visit: Payer: Self-pay

## 2023-05-25 DIAGNOSIS — D509 Iron deficiency anemia, unspecified: Secondary | ICD-10-CM

## 2023-05-28 ENCOUNTER — Inpatient Hospital Stay: Payer: 59 | Attending: Oncology

## 2023-05-28 DIAGNOSIS — D509 Iron deficiency anemia, unspecified: Secondary | ICD-10-CM | POA: Insufficient documentation

## 2023-05-28 LAB — IRON AND TIBC
Iron: 18 ug/dL — ABNORMAL LOW (ref 28–170)
Saturation Ratios: 5 % — ABNORMAL LOW (ref 10.4–31.8)
TIBC: 353 ug/dL (ref 250–450)
UIBC: 335 ug/dL

## 2023-05-28 LAB — CBC WITH DIFFERENTIAL (CANCER CENTER ONLY)
Abs Immature Granulocytes: 0.01 10*3/uL (ref 0.00–0.07)
Basophils Absolute: 0.1 10*3/uL (ref 0.0–0.1)
Basophils Relative: 2 %
Eosinophils Absolute: 0 10*3/uL (ref 0.0–0.5)
Eosinophils Relative: 1 %
HCT: 33.6 % — ABNORMAL LOW (ref 36.0–46.0)
Hemoglobin: 10.2 g/dL — ABNORMAL LOW (ref 12.0–15.0)
Immature Granulocytes: 0 %
Lymphocytes Relative: 45 %
Lymphs Abs: 1.7 10*3/uL (ref 0.7–4.0)
MCH: 25.1 pg — ABNORMAL LOW (ref 26.0–34.0)
MCHC: 30.4 g/dL (ref 30.0–36.0)
MCV: 82.6 fL (ref 80.0–100.0)
Monocytes Absolute: 0.3 10*3/uL (ref 0.1–1.0)
Monocytes Relative: 7 %
Neutro Abs: 1.7 10*3/uL (ref 1.7–7.7)
Neutrophils Relative %: 45 %
Platelet Count: 325 10*3/uL (ref 150–400)
RBC: 4.07 MIL/uL (ref 3.87–5.11)
RDW: 16.9 % — ABNORMAL HIGH (ref 11.5–15.5)
WBC Count: 3.7 10*3/uL — ABNORMAL LOW (ref 4.0–10.5)
nRBC: 0 % (ref 0.0–0.2)

## 2023-05-28 LAB — SAMPLE TO BLOOD BANK

## 2023-05-28 LAB — FERRITIN: Ferritin: 4 ng/mL — ABNORMAL LOW (ref 11–307)

## 2023-05-29 ENCOUNTER — Inpatient Hospital Stay (HOSPITAL_BASED_OUTPATIENT_CLINIC_OR_DEPARTMENT_OTHER): Payer: 59 | Admitting: Oncology

## 2023-05-29 ENCOUNTER — Inpatient Hospital Stay: Payer: 59

## 2023-05-29 ENCOUNTER — Encounter: Payer: Self-pay | Admitting: Oncology

## 2023-05-29 VITALS — BP 154/83 | HR 71 | Temp 97.2°F | Resp 16 | Ht 68.0 in | Wt 207.3 lb

## 2023-05-29 VITALS — BP 146/81 | HR 65

## 2023-05-29 DIAGNOSIS — D509 Iron deficiency anemia, unspecified: Secondary | ICD-10-CM | POA: Diagnosis not present

## 2023-05-29 MED ORDER — IRON SUCROSE 20 MG/ML IV SOLN
200.0000 mg | Freq: Once | INTRAVENOUS | Status: AC
  Administered 2023-05-29: 200 mg via INTRAVENOUS
  Filled 2023-05-29: qty 10

## 2023-05-29 MED ORDER — SODIUM CHLORIDE 0.9% FLUSH
10.0000 mL | Freq: Once | INTRAVENOUS | Status: DC | PRN
  Filled 2023-05-29: qty 10

## 2023-05-29 NOTE — Progress Notes (Signed)
 [facility]  [phone] [phone]  ID: Debra Soto OB: Jul 17, 1960  MR#: [id number]  AOZ#:[id number]  Patient Care Team: Alba Cory, MD as PCP - General (Family Medicine) Antonieta Iba, MD as Consulting Physician (Cardiology) Lonell Face, MD as Consulting Physician (Neurology) Humberto Leep, MD as Referring Physician (Rheumatology) Jeralyn Ruths, MD as Consulting Physician (Oncology) Ramsay, Sharen Heck, MD as Referring Physician (Internal Medicine) Vincente Poli, MD as Referring Physician (Pulmonary Disease)  CHIEF COMPLAINT: Iron deficiency anemia.  INTERVAL HISTORY: Patient returns to clinic today for repeat laboratory, further evaluation, and consideration of additional IV Venofer.  She continues to have chronic weakness and fatigue, but states this has improved.  She does not complain of dizziness and has no other neurologic complaints.  She denies any recent fevers or illnesses.  She has a good appetite and denies weight loss.  She has no chest pain, shortness of breath, cough, or hemoptysis.  She denies any nausea, vomiting, constipation, or diarrhea.  She has no melena or hematochezia.  She has no urinary complaints.  Patient offers no further specific complaints today.  REVIEW OF SYSTEMS:   Review of Systems  Constitutional:  Positive for malaise/fatigue. Negative for fever and weight loss.  Respiratory: Negative.  Negative for cough, hemoptysis and shortness of breath.   Cardiovascular: Negative.  Negative for chest pain and leg swelling.  Gastrointestinal: Negative.  Negative for abdominal pain, blood in stool and melena.  Genitourinary: Negative.  Negative for hematuria.  Musculoskeletal: Negative.  Negative for back pain.  Skin: Negative.  Negative for rash.  Neurological:  Positive for weakness. Negative for dizziness, focal weakness and headaches.  Psychiatric/Behavioral: Negative.  The patient is not  nervous/anxious.     As per HPI. Otherwise, a complete review of systems is negative.  PAST MEDICAL HISTORY: Past Medical History:  Diagnosis Date   Allergy    Arthritis    Depression    Erythrodermic psoriasis    Fibromyalgia    GERD (gastroesophageal reflux disease)    Hyperlipemia    Insomnia    Obesity    Recurrent HSV (herpes simplex virus)    Sleep apnea    pt states does not use CPAP   Thyroid disease    Vitamin D deficiency     PAST SURGICAL HISTORY: Past Surgical History:  Procedure Laterality Date   ABDOMINAL HYSTERECTOMY  [id number]   bladder tact     BREAST BIOPSY Right 09/02/2015   US Guided biopsy - Ribbon shaped marker - negative   CHOLECYSTECTOMY  [id number]   COLONOSCOPY     COLONOSCOPY WITH PROPOFOL N/A 02/10/2016   Procedure: COLONOSCOPY WITH PROPOFOL;  Surgeon: Midge Minium, MD;  Location: [facility];  Service: Endoscopy;  Laterality: N/A;   ESOPHAGOGASTRODUODENOSCOPY     ESOPHAGOGASTRODUODENOSCOPY (EGD) WITH PROPOFOL N/A 02/10/2016   Procedure: ESOPHAGOGASTRODUODENOSCOPY (EGD) WITH PROPOFOL;  Surgeon: Midge Minium, MD;  Location: [facility];  Service: Endoscopy;  Laterality: N/A;  sleep apnea   LIPOMA EXCISION     POLYPECTOMY  02/10/2016   Procedure: POLYPECTOMY;  Surgeon: Midge Minium, MD;  Location: [facility];  Service: Endoscopy;;   TONSILECTOMY, ADENOIDECTOMY, BILATERAL MYRINGOTOMY AND TUBES      FAMILY HISTORY: Family History  Problem Relation Age of Onset   Hypertension Mother    Arthritis Mother    Cancer Mother        ovarian,breast, lung, liver   Breast cancer Mother 79   Ovarian  cancer Mother 21   Lung cancer Mother 3   Colon cancer Brother    Other Father        lung problems   Ovarian cancer Maternal Grandmother        pt thinks is was ovarian but not sure   Heart disease Brother    Cancer Maternal Grandfather     ADVANCED DIRECTIVES (Y/N):  N  HEALTH MAINTENANCE: Social History   Tobacco Use    Smoking status: Never   Smokeless tobacco: Never  Vaping Use   Vaping status: Never Used  Substance Use Topics   Alcohol use: Yes    Alcohol/week: 0.0 standard drinks of alcohol    Comment: rarely   Drug use: No     Colonoscopy:  PAP:  Bone density:  Lipid panel:  No Known Allergies  Current Outpatient Medications  Medication Sig Dispense Refill   levothyroxine (SYNTHROID) 200 MCG tablet Take 1 tablet (200 mcg total) by mouth daily before breakfast. 90 tablet 1   folic acid (FOLVITE) 1 MG tablet Take 1 tablet (1 mg total) by mouth daily. (Patient not taking: Reported on 02/27/2023) 100 tablet 1   omeprazole (PRILOSEC) 10 MG capsule Take 10 mg by mouth daily. (Patient not taking: Reported on 05/29/2023)     No current facility-administered medications for this visit.   Facility-Administered Medications Ordered in Other Visits  Medication Dose Route Frequency Provider Last Rate Last Admin   sodium chloride flush (NS) 0.9 % injection 10 mL  10 mL Intracatheter Once PRN Jeralyn Ruths, MD        OBJECTIVE: Vitals:   05/29/23 1004  BP: (!) 154/83  Pulse: 71  Resp: 16  Temp: (!) 97.2 F (36.2 C)  SpO2: 100%     Body mass index is 31.52 kg/m.    ECOG FS:1 - Symptomatic but completely ambulatory  General: Well-developed, well-nourished, no acute distress. Eyes: Pink conjunctiva, anicteric sclera. HEENT: Normocephalic, moist mucous membranes. Lungs: No audible wheezing or coughing. Heart: Regular rate and rhythm. Abdomen: Soft, nontender, no obvious distention. Musculoskeletal: No edema, cyanosis, or clubbing. Neuro: Alert, answering all questions appropriately. Cranial nerves grossly intact. Skin: No rashes or petechiae noted. Psych: Normal affect.  LAB RESULTS:  Lab Results  Component Value Date   NA 139 01/09/2023   K 4.5 01/09/2023   CL 104 01/09/2023   CO2 29 01/09/2023   GLUCOSE 96 01/09/2023   BUN 13 01/09/2023   CREATININE 0.73 01/09/2023   CALCIUM  9.5 01/09/2023   PROT 7.3 01/09/2023   ALBUMIN 4.0 04/21/2022   AST 12 01/09/2023   ALT 8 01/09/2023   ALKPHOS 121 04/21/2022   BILITOT 0.3 01/09/2023   GFRNONAA 54 (L) 04/21/2022   GFRAA 74 10/17/2019    Lab Results  Component Value Date   WBC 3.7 (L) 05/28/2023   NEUTROABS 1.7 05/28/2023   HGB 10.2 (L) 05/28/2023   HCT 33.6 (L) 05/28/2023   MCV 82.6 05/28/2023   PLT 325 05/28/2023   Lab Results  Component Value Date   IRON 18 (L) 05/28/2023   TIBC 353 05/28/2023   IRONPCTSAT 5 (L) 05/28/2023   Lab Results  Component Value Date   FERRITIN 4 (L) 05/28/2023     STUDIES: No results found.  ASSESSMENT: Iron deficiency anemia.  PLAN:    Iron deficiency anemia: Patient underwent colonoscopy on March 17, 2021 that did not report any significant pathology.  Patient's hemoglobin remains decreased, but improved to 10.2.  Iron stores  remain significantly decreased.  Proceed with 200 mg IV Venofer today.  Return to clinic 4 times over the next 1 to 2 weeks for additional treatment.  Patient will then return to clinic in 4 months with repeat laboratory work, further evaluation, and consideration of additional IV Venofer if necessary. Leukopenia: Mild, monitor. Hypertension: Mild.  Continue monitoring and treatment per primary care.  I spent a total of 30 minutes reviewing chart data, face-to-face evaluation with the patient, counseling and coordination of care as detailed above.     Patient expressed understanding and was in agreement with this plan. She also understands that She can call clinic at any time with any questions, concerns, or complaints.    Jeralyn Ruths, MD   05/29/2023 11:02 AM

## 2023-05-29 NOTE — Patient Instructions (Signed)

## 2023-06-05 ENCOUNTER — Inpatient Hospital Stay

## 2023-06-05 VITALS — BP 136/76 | HR 67 | Temp 97.8°F | Resp 16

## 2023-06-05 DIAGNOSIS — D509 Iron deficiency anemia, unspecified: Secondary | ICD-10-CM | POA: Diagnosis not present

## 2023-06-05 MED ORDER — IRON SUCROSE 20 MG/ML IV SOLN
200.0000 mg | Freq: Once | INTRAVENOUS | Status: AC
  Administered 2023-06-05: 200 mg via INTRAVENOUS
  Filled 2023-06-05: qty 10

## 2023-06-05 MED ORDER — SODIUM CHLORIDE 0.9% FLUSH
10.0000 mL | Freq: Once | INTRAVENOUS | Status: AC
  Administered 2023-06-05: 10 mL via INTRAVENOUS
  Filled 2023-06-05: qty 10

## 2023-06-08 ENCOUNTER — Inpatient Hospital Stay

## 2023-06-08 VITALS — BP 134/71 | HR 75 | Resp 16

## 2023-06-08 DIAGNOSIS — D509 Iron deficiency anemia, unspecified: Secondary | ICD-10-CM

## 2023-06-08 MED ORDER — IRON SUCROSE 20 MG/ML IV SOLN
200.0000 mg | Freq: Once | INTRAVENOUS | Status: AC
  Administered 2023-06-08: 200 mg via INTRAVENOUS
  Filled 2023-06-08: qty 10

## 2023-06-08 NOTE — Patient Instructions (Signed)

## 2023-06-12 ENCOUNTER — Inpatient Hospital Stay: Attending: Oncology

## 2023-06-12 VITALS — BP 124/68 | HR 71 | Temp 96.3°F | Resp 18

## 2023-06-12 DIAGNOSIS — D509 Iron deficiency anemia, unspecified: Secondary | ICD-10-CM | POA: Diagnosis present

## 2023-06-12 MED ORDER — IRON SUCROSE 20 MG/ML IV SOLN
200.0000 mg | Freq: Once | INTRAVENOUS | Status: AC
  Administered 2023-06-12: 200 mg via INTRAVENOUS

## 2023-06-12 NOTE — Patient Instructions (Signed)

## 2023-06-15 ENCOUNTER — Inpatient Hospital Stay

## 2023-06-26 ENCOUNTER — Inpatient Hospital Stay

## 2023-06-26 VITALS — BP 125/68 | HR 64 | Temp 97.2°F | Resp 18

## 2023-06-26 DIAGNOSIS — D509 Iron deficiency anemia, unspecified: Secondary | ICD-10-CM | POA: Diagnosis not present

## 2023-06-26 MED ORDER — IRON SUCROSE 20 MG/ML IV SOLN
200.0000 mg | Freq: Once | INTRAVENOUS | Status: AC
  Administered 2023-06-26: 200 mg via INTRAVENOUS
  Filled 2023-06-26: qty 10

## 2023-07-24 ENCOUNTER — Other Ambulatory Visit: Payer: Self-pay | Admitting: Family Medicine

## 2023-07-24 ENCOUNTER — Encounter: Payer: Self-pay | Admitting: Family Medicine

## 2023-07-24 ENCOUNTER — Ambulatory Visit: Payer: 59 | Admitting: Family Medicine

## 2023-07-24 VITALS — BP 120/82 | HR 94 | Resp 16 | Ht 68.0 in | Wt 205.5 lb

## 2023-07-24 DIAGNOSIS — M797 Fibromyalgia: Secondary | ICD-10-CM

## 2023-07-24 DIAGNOSIS — R350 Frequency of micturition: Secondary | ICD-10-CM | POA: Diagnosis not present

## 2023-07-24 DIAGNOSIS — K219 Gastro-esophageal reflux disease without esophagitis: Secondary | ICD-10-CM

## 2023-07-24 DIAGNOSIS — D508 Other iron deficiency anemias: Secondary | ICD-10-CM

## 2023-07-24 DIAGNOSIS — E538 Deficiency of other specified B group vitamins: Secondary | ICD-10-CM

## 2023-07-24 DIAGNOSIS — E039 Hypothyroidism, unspecified: Secondary | ICD-10-CM

## 2023-07-24 DIAGNOSIS — J849 Interstitial pulmonary disease, unspecified: Secondary | ICD-10-CM | POA: Diagnosis not present

## 2023-07-24 DIAGNOSIS — I700 Atherosclerosis of aorta: Secondary | ICD-10-CM | POA: Diagnosis not present

## 2023-07-24 DIAGNOSIS — M3501 Sicca syndrome with keratoconjunctivitis: Secondary | ICD-10-CM | POA: Diagnosis not present

## 2023-07-24 DIAGNOSIS — E559 Vitamin D deficiency, unspecified: Secondary | ICD-10-CM

## 2023-07-24 DIAGNOSIS — L299 Pruritus, unspecified: Secondary | ICD-10-CM

## 2023-07-24 LAB — POCT URINALYSIS DIPSTICK
Bilirubin, UA: NEGATIVE
Glucose, UA: NEGATIVE
Ketones, UA: NEGATIVE
Nitrite, UA: NEGATIVE
Protein, UA: NEGATIVE
Spec Grav, UA: 1.015 (ref 1.010–1.025)
Urobilinogen, UA: 0.2 U/dL
pH, UA: 6.5 (ref 5.0–8.0)

## 2023-07-24 MED ORDER — LEVOTHYROXINE SODIUM 200 MCG PO TABS: 200.0000 ug | ORAL_TABLET | Freq: Every day | ORAL | 1 refills | Status: DC

## 2023-07-24 NOTE — Progress Notes (Signed)
 Name: Debra Soto   MRN: [id number]    DOB: [birth year]   Date:07/24/2023       Progress Note  Subjective  Chief Complaint  Chief Complaint  Patient presents with   Medical Management of Chronic Issues   Urinary Frequency    For about a month, no pain   Discussed the use of AI scribe software for clinical note transcription with the patient, who gave verbal consent to proceed.  History of Present Illness Debra Soto is a 63 year old female with Sjogren's syndrome and iron  deficiency anemia who presents for a regular follow-up visit.  She has been experiencing bladder issues characterized by frequent urination for several months, which has progressively worsened. There is a sense of urgency, often rushing to the bathroom to avoid accidents, and nocturia, having to get up at night to urinate. No pain, burning, or visible blood in her urine.  She discovered a note about Sjogren's syndrome in her medical records from 2016, initially thinking it was only related to her eyes. She has multiple symptoms associated with Sjogren's, including dry eyes, dry mouth, and dry vulva. She has not seen her rheumatologist recently, and her vision and other symptoms are worsening.  She has a history of iron  deficiency anemia, with her hemoglobin levels fluctuating. In December, her hemoglobin was 7.3, and by March, it had increased to 10.2. She has received multiple iron  infusions, the most recent being in January. She experiences cravings for sweets, particularly cake, but has not gained weight. She also reports persistent thirst.  She takes levothyroxine  200 mcg six days a week for her thyroid condition, skipping Sundays unless she misses a dose during the week. She also takes Prilosec as needed for heartburn, approximately once every three to four days. She is not currently taking folic acid  or vitamin D  supplements, despite previous deficiencies.  She has a history of fibromyalgia and recently experienced a  flare-up lasting about two weeks after an illness, with improvement since then. She also has a history of depression and fibromyalgia-related symptoms but is not currently on any antidepressants. She feels emotionally stable, though she acknowledges occasional sadness.  She has interstitial lung disease and is under the care of a pulmonologist. She missed a follow-up appointment last year and needs to reschedule.    Patient Active Problem List   Diagnosis Date Noted   Memory problem 01/09/2023   Degeneration of intervertebral disc of lumbar region with discogenic back pain 01/09/2023   Family history of Alzheimer's disease 01/09/2023   Attention deficit hyperactivity disorder (ADHD), combined type 09/05/2022   Folic acid  deficiency 09/05/2022   Atherosclerosis of abdominal aorta (HCC) 05/30/2022   B12 deficiency 05/30/2022   Hiatal hernia without gangrene and obstruction 05/30/2022   Sicca, unspecified type (HCC) 04/21/2022   Interstitial lung disease (HCC) 05/16/2021   Morbid obesity (HCC) 12/27/2018   Iron  deficiency anemia 11/06/2017   Essential (hemorrhagic) thrombocythemia (HCC)    Benign neoplasm of descending colon    Heartburn    Gastric polyp    Psoriasis 01/25/2016   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/22/2015   Endometriosis 12/31/2014   Insomnia, persistent 09/29/2014   IBS (irritable bowel syndrome) 09/29/2014   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/2014   Dyslipidemia 09/29/2014   Fibromyalgia syndrome 09/29/2014   Gastro-esophageal reflux disease without esophagitis 09/29/2014   Herpes labialis 09/29/2014   Adult hypothyroidism 09/29/2014   Family history of malignant neoplasm of breast 09/29/2014   Major depression, recurrent, chronic (HCC) 09/29/2014   Obstructive  apnea 09/29/2014   Abnormal presence of protein in urine 09/29/2014   Allergic rhinitis 09/29/2014   Vitamin D  deficiency 09/29/2014   Urge incontinence 09/29/2014   Headache, tension-type 09/29/2014    Menopausal symptom 09/29/2014    Past Surgical History:  Procedure Laterality Date   ABDOMINAL HYSTERECTOMY  [id number]   bladder tact     BREAST BIOPSY Right 09/02/2015   US  Guided biopsy - Ribbon shaped marker - negative   CHOLECYSTECTOMY  [id number]   COLONOSCOPY     COLONOSCOPY WITH PROPOFOL  N/A 02/10/2016   Procedure: COLONOSCOPY WITH PROPOFOL ;  Surgeon: Marnee Sink, MD;  Location: [facility];  Service: Endoscopy;  Laterality: N/A;   ESOPHAGOGASTRODUODENOSCOPY     ESOPHAGOGASTRODUODENOSCOPY (EGD) WITH PROPOFOL  N/A 02/10/2016   Procedure: ESOPHAGOGASTRODUODENOSCOPY (EGD) WITH PROPOFOL ;  Surgeon: Marnee Sink, MD;  Location: [facility];  Service: Endoscopy;  Laterality: N/A;  sleep apnea   LIPOMA EXCISION     POLYPECTOMY  02/10/2016   Procedure: POLYPECTOMY;  Surgeon: Marnee Sink, MD;  Location: [facility];  Service: Endoscopy;;   TONSILECTOMY, ADENOIDECTOMY, BILATERAL MYRINGOTOMY AND TUBES      Family History  Problem Relation Age of Onset   Hypertension Mother    Arthritis Mother    Cancer Mother        ovarian,breast, lung, liver   Breast cancer Mother 51   Ovarian cancer Mother 54   Lung cancer Mother 50   Colon cancer Brother    Other Father        lung problems   Ovarian cancer Maternal Grandmother        pt thinks is was ovarian but not sure   Heart disease Brother    Cancer Maternal Grandfather     Social History   Tobacco Use   Smoking status: Never   Smokeless tobacco: Never  Substance Use Topics   Alcohol use: Yes    Alcohol/week: 0.0 standard drinks of alcohol    Comment: rarely     Current Outpatient Medications:    levothyroxine  (SYNTHROID ) 200 MCG tablet, Take 1 tablet (200 mcg total) by mouth daily before breakfast., Disp: 90 tablet, Rfl: 1   folic acid  (FOLVITE ) 1 MG tablet, Take 1 tablet (1 mg total) by mouth daily. (Patient not taking: Reported on 07/24/2023), Disp: 100 tablet, Rfl: 1   omeprazole  (PRILOSEC) 10 MG  capsule, Take 10 mg by mouth daily. (Patient not taking: Reported on 05/29/2023), Disp: , Rfl:   No Known Allergies  I personally reviewed active problem list, medication list, allergies with the patient/caregiver today.   ROS  Ten systems reviewed and is negative except as mentioned in HPI    Objective Physical Exam Constitutional: Patient appears well-developed and well-nourished. Obese  No distress.  HEENT: head atraumatic, normocephalic, pupils equal and reactive to light, neck supple Cardiovascular: Normal rate, regular rhythm and normal heart sounds.  No murmur heard. No BLE edema. Pulmonary/Chest: Effort normal and breath sounds normal. No respiratory distress. Abdominal: Soft.  There is no tenderness. Negative CVA tenderness  Muscular skeletal: trigger point positive  Psychiatric: Patient has a normal mood and affect. behavior is normal. Judgment and thought content normal.    Vitals:   07/24/23 0955  BP: 120/82  Pulse: 94  Resp: 16  SpO2: 100%  Weight: 205 lb 8 oz (93.2 kg)  Height: 5\' 8"  (1.727 m)    Body mass index is 31.25 kg/m.  Recent Results (from the past 2160 hours)  Hold Tube- Blood Bank  Status: None   Collection Time: 05/28/23  8:48 AM  Result Value Ref Range   Blood Bank Specimen SAMPLE AVAILABLE FOR TESTING    Sample Expiration      05/31/2023,2359 Performed at [facility], [address] [place], [place] [address]   Ferritin     Status: Abnormal   Collection Time: 05/28/23  8:48 AM  Result Value Ref Range   Ferritin 4 (L) 11 - 307 ng/mL    Comment: Performed at [facility], [address] [place], [place] [address]  Iron  and TIBC([facility])     Status: Abnormal   Collection Time: 05/28/23  8:48 AM  Result Value Ref Range   Iron  18 (L) 28 - 170 ug/dL   TIBC 829 562 - 130 ug/dL   Saturation Ratios 5 (L) 10.4 - 31.8 %   UIBC 335 ug/dL    Comment: Performed at [facility], [address]  [place], [place] [address]  CBC with Differential (Cancer Center Only)     Status: Abnormal   Collection Time: 05/28/23  8:48 AM  Result Value Ref Range   WBC Count 3.7 (L) 4.0 - 10.5 K/uL   RBC 4.07 3.87 - 5.11 MIL/uL   Hemoglobin 10.2 (L) 12.0 - 15.0 g/dL   HCT 46.9 (L) 62.9 - 52.8 %   MCV 82.6 80.0 - 100.0 fL   MCH 25.1 (L) 26.0 - 34.0 pg   MCHC 30.4 30.0 - 36.0 g/dL   RDW 41.3 (H) 24.4 - 01.0 %   Platelet Count 325 150 - 400 K/uL   nRBC 0.0 0.0 - 0.2 %   Neutrophils Relative % 45 %   Neutro Abs 1.7 1.7 - 7.7 K/uL   Lymphocytes Relative 45 %   Lymphs Abs 1.7 0.7 - 4.0 K/uL   Monocytes Relative 7 %   Monocytes Absolute 0.3 0.1 - 1.0 K/uL   Eosinophils Relative 1 %   Eosinophils Absolute 0.0 0.0 - 0.5 K/uL   Basophils Relative 2 %   Basophils Absolute 0.1 0.0 - 0.1 K/uL   Immature Granulocytes 0 %   Abs Immature Granulocytes 0.01 0.00 - 0.07 K/uL    Comment: Performed at [facility], [address] [place], [place] [address]  POCT urinalysis dipstick     Status: Abnormal   Collection Time: 07/24/23 10:01 AM  Result Value Ref Range   Color, UA Yellow    Clarity, UA Cloudy    Glucose, UA Negative Negative   Bilirubin, UA Negative    Ketones, UA Negative    Spec Grav, UA 1.015 1.010 - 1.025   Blood, UA Trace    pH, UA 6.5 5.0 - 8.0   Protein, UA Negative Negative   Urobilinogen, UA 0.2 0.2 or 1.0 E.U./dL   Nitrite, UA Negative    Leukocytes, UA Small (1+) (A) Negative   Appearance yellow    Odor Foul       PHQ2/9:    07/24/2023    9:54 AM 04/17/2023    8:38 AM 01/09/2023    8:25 AM 01/09/2023    8:22 AM 09/05/2022    8:44 AM  Depression screen PHQ 2/9  Decreased Interest 0 0 0 0 0  Down, Depressed, Hopeless 0 0 0 0 0  PHQ - 2 Score 0 0 0 0 0  Altered sleeping 0 0 0  3  Tired, decreased energy 0 0 0  0  Change in appetite 0 0 0  0  Feeling  bad or failure about yourself  0 0 0  0  Trouble concentrating 0 0 0  1  Moving slowly or fidgety/restless 0 0 0  0   Suicidal thoughts 0 0 0  0  PHQ-9 Score 0 0 0  4  Difficult doing work/chores Not difficult at all Not difficult at all Not difficult at all      phq 9 is negative  Fall Risk:    07/24/2023    9:48 AM 04/17/2023    8:38 AM 01/09/2023    8:22 AM 09/05/2022    8:44 AM 05/30/2022    9:15 AM  Fall Risk   Falls in the past year? 1 0 0 0 0  Number falls in past yr: 0 0 0 0 0  Injury with Fall? 0 0 0 0 0  Risk for fall due to : Impaired balance/gait No Fall Risks No Fall Risks No Fall Risks No Fall Risks  Follow up Falls prevention discussed;Education provided;Falls evaluation completed Falls prevention discussed;Education provided;Falls evaluation completed Falls prevention discussed Falls prevention discussed Falls prevention discussed      Assessment & Plan Urinary frequency Chronic urinary frequency with nocturia. Urinalysis suggests possible infection. Differential includes infection or overactive bladder. - Send urine for culture. - If culture positive, treat for infection. - If culture negative, consider anticholinergic medication such as Detrol LA or VisiCare, with discussion of potential side effects including dry mouth and drowsiness.  Iron  deficiency anemia Iron  deficiency anemia with improving hemoglobin levels. Recent iron  infusion. No evidence of autoimmune anemia related to Sjogren's syndrome. Possible causes include insufficient dietary iron  intake or blood loss. - Continue monitoring hemoglobin levels. - Discuss dietary iron  intake. - Consider further iron  infusions if necessary.  Sjogren's syndrome Sjogren's syndrome with symptoms of dry eyes, dry mouth, and dry vulva. Concerns about worsening vision and potential blood issues. Anemia is due to iron  deficiency, not Sjogren's. - Advise scheduling appointment with rheumatologist. - Advise scheduling appointment with eye doctor for worsening vision.  Hypothyroidism Hypothyroidism managed with levothyroxine . Recent TSH  level indicates well-managed thyroid function. Significant weight loss noted. - Continue current levothyroxine  regimen. - Check TSH levels due to weight loss.  Fibromyalgia Fibromyalgia with recent flare-up. Symptoms include memory fogginess and fatigue. No current specific medication. - Consider treatment options if flare-ups persist.  Atherosclerosis Atherosclerosis with no current cholesterol management. She reports feeling better without medication. - Order cholesterol panel to assess current levels.  Gastroesophageal reflux disease (GERD) GERD managed with Prilosec as needed. Significant improvement in symptoms. - Continue Prilosec as needed.

## 2023-07-25 ENCOUNTER — Ambulatory Visit: Payer: Self-pay | Admitting: Family Medicine

## 2023-07-25 DIAGNOSIS — E039 Hypothyroidism, unspecified: Secondary | ICD-10-CM

## 2023-07-25 LAB — LIPID PANEL
Cholesterol: 208 mg/dL — ABNORMAL HIGH (ref <200)
HDL: 54 mg/dL (ref >=50)
LDL Cholesterol (Calc): 134 mg/dL — ABNORMAL HIGH
Non-HDL Cholesterol (Calc): 154 mg/dL — ABNORMAL HIGH (ref <130)
Total CHOL/HDL Ratio: 3.9 (calc) (ref <5.0)
Triglycerides: 98 mg/dL (ref <150)

## 2023-07-25 LAB — HEMOGLOBIN A1C
Hgb A1c MFr Bld: 5.2 % (ref <5.7)
Mean Plasma Glucose: 103 mg/dL
eAG (mmol/L): 5.7 mmol/L

## 2023-07-25 LAB — VITAMIN D 25 HYDROXY (VIT D DEFICIENCY, FRACTURES): Vit D, 25-Hydroxy: 37 ng/mL (ref 30–100)

## 2023-07-25 LAB — URINE CULTURE
MICRO NUMBER:: 16451778
Result:: NO GROWTH
SPECIMEN QUALITY:: ADEQUATE

## 2023-07-25 LAB — TSH: TSH: 0.11 m[IU]/L — ABNORMAL LOW (ref 0.40–4.50)

## 2023-07-25 LAB — B12 AND FOLATE PANEL
Folate: 5.8 ng/mL
Vitamin B-12: 331 pg/mL (ref 200–1100)

## 2023-07-25 MED ORDER — LEVOTHYROXINE SODIUM 175 MCG PO TABS: 175.0000 ug | ORAL_TABLET | Freq: Every day | ORAL | 0 refills | Status: DC

## 2023-07-25 NOTE — Telephone Encounter (Signed)
 Unable to refill per protocol, Rx expired.  Discontinued 04/17/23.  Requested Prescriptions  Pending Prescriptions Disp Refills   hydrOXYzine  (VISTARIL ) 25 MG capsule [Pharmacy Med Name: HYDROXYZINE  PAMOATE 25MG  CAPSULES] 30 capsule 0    Sig: TAKE 1 CAPSULE(25 MG) BY MOUTH AT BEDTIME AS NEEDED     Ear, Nose, and Throat:  Antihistamines 2 Passed - 07/25/2023  3:50 PM      Passed - Cr in normal range and within 360 days    Creat  Date Value Ref Range Status  01/09/2023 0.73 0.50 - 1.05 mg/dL Final         Passed - Valid encounter within last 12 months    Recent Outpatient Visits           Yesterday Interstitial lung disease [facility])   [facility] Arleen Lacer, MD   3 months ago Interstitial lung disease [facility])   [facility] Sowles, Krichna, MD

## 2023-09-28 ENCOUNTER — Other Ambulatory Visit: Payer: Self-pay | Admitting: *Deleted

## 2023-09-28 DIAGNOSIS — D509 Iron deficiency anemia, unspecified: Secondary | ICD-10-CM

## 2023-10-01 ENCOUNTER — Inpatient Hospital Stay: Attending: Oncology

## 2023-10-01 DIAGNOSIS — D509 Iron deficiency anemia, unspecified: Secondary | ICD-10-CM | POA: Insufficient documentation

## 2023-10-01 DIAGNOSIS — I10 Essential (primary) hypertension: Secondary | ICD-10-CM | POA: Insufficient documentation

## 2023-10-01 LAB — CBC WITH DIFFERENTIAL/PLATELET
Abs Immature Granulocytes: 0.01 K/uL (ref 0.00–0.07)
Basophils Absolute: 0.1 K/uL (ref 0.0–0.1)
Basophils Relative: 1 %
Eosinophils Absolute: 0 K/uL (ref 0.0–0.5)
Eosinophils Relative: 1 %
HCT: 41.3 % (ref 36.0–46.0)
Hemoglobin: 13.4 g/dL (ref 12.0–15.0)
Immature Granulocytes: 0 %
Lymphocytes Relative: 33 %
Lymphs Abs: 1.8 K/uL (ref 0.7–4.0)
MCH: 28.5 pg (ref 26.0–34.0)
MCHC: 32.4 g/dL (ref 30.0–36.0)
MCV: 87.7 fL (ref 80.0–100.0)
Monocytes Absolute: 0.4 K/uL (ref 0.1–1.0)
Monocytes Relative: 6 %
Neutro Abs: 3.3 K/uL (ref 1.7–7.7)
Neutrophils Relative %: 59 %
Platelets: 332 K/uL (ref 150–400)
RBC: 4.71 MIL/uL (ref 3.87–5.11)
RDW: 13.1 % (ref 11.5–15.5)
WBC: 5.6 K/uL (ref 4.0–10.5)
nRBC: 0 % (ref 0.0–0.2)

## 2023-10-01 LAB — IRON AND TIBC
Iron: 71 ug/dL (ref 28–170)
Saturation Ratios: 22 % (ref 10.4–31.8)
TIBC: 328 ug/dL (ref 250–450)
UIBC: 257 ug/dL

## 2023-10-01 LAB — FERRITIN: Ferritin: 20 ng/mL (ref 11–307)

## 2023-10-02 ENCOUNTER — Ambulatory Visit

## 2023-10-02 ENCOUNTER — Ambulatory Visit: Admitting: Oncology

## 2023-10-08 ENCOUNTER — Other Ambulatory Visit: Payer: Self-pay | Admitting: Family Medicine

## 2023-10-08 DIAGNOSIS — R928 Other abnormal and inconclusive findings on diagnostic imaging of breast: Secondary | ICD-10-CM

## 2023-10-08 DIAGNOSIS — Z1231 Encounter for screening mammogram for malignant neoplasm of breast: Secondary | ICD-10-CM

## 2023-10-08 DIAGNOSIS — R921 Mammographic calcification found on diagnostic imaging of breast: Secondary | ICD-10-CM

## 2023-10-10 ENCOUNTER — Ambulatory Visit
Admission: RE | Admit: 2023-10-10 | Discharge: 2023-10-10 | Disposition: A | Discharge status: Home / Self Care | Source: Ambulatory Visit | Attending: Family Medicine | Admitting: Family Medicine

## 2023-10-10 ENCOUNTER — Encounter: Payer: Self-pay | Admitting: Oncology

## 2023-10-10 ENCOUNTER — Inpatient Hospital Stay (HOSPITAL_BASED_OUTPATIENT_CLINIC_OR_DEPARTMENT_OTHER): Admitting: Oncology

## 2023-10-10 ENCOUNTER — Inpatient Hospital Stay
Admission: RE | Admit: 2023-10-10 | Discharge: 2023-10-10 | Discharge status: Home / Self Care | Source: Ambulatory Visit | Attending: Family Medicine | Admitting: Family Medicine

## 2023-10-10 ENCOUNTER — Inpatient Hospital Stay

## 2023-10-10 VITALS — BP 138/79 | HR 83 | Temp 97.6°F | Resp 18 | Ht 68.0 in | Wt 201.0 lb

## 2023-10-10 DIAGNOSIS — R921 Mammographic calcification found on diagnostic imaging of breast: Secondary | ICD-10-CM | POA: Diagnosis present

## 2023-10-10 DIAGNOSIS — D509 Iron deficiency anemia, unspecified: Secondary | ICD-10-CM | POA: Diagnosis not present

## 2023-10-10 DIAGNOSIS — R928 Other abnormal and inconclusive findings on diagnostic imaging of breast: Secondary | ICD-10-CM

## 2023-10-10 DIAGNOSIS — Z1231 Encounter for screening mammogram for malignant neoplasm of breast: Secondary | ICD-10-CM | POA: Diagnosis present

## 2023-10-10 NOTE — Progress Notes (Unsigned)
 [facility]  [phone] [phone]  ID: Debra Soto OB: 02/25/1961  MR#: [id number]  RDW#:[id number]  Patient Care Team: Sowles, Krichna, MD as PCP - General (Family Medicine) Perla Evalene PARAS, MD as Consulting Physician (Cardiology) Maree Jannett POUR, MD as Consulting Physician (Neurology) Maree Korene PEDLAR, MD as Referring Physician (Rheumatology) Jacobo Evalene PARAS, MD as Consulting Physician (Oncology) Ramsay, Alm KATHEE CLORE, MD as Referring Physician (Internal Medicine) Allene Mildred Amen, MD as Referring Physician (Pulmonary Disease) [place] Rogelia BROCKS, OD (Optometry)  CHIEF COMPLAINT: Iron  deficiency anemia.  INTERVAL HISTORY: Patient returns to clinic today for repeat laboratory, further evaluation, consideration of additional IV Venofer .  She currently feels well.  She does not complain of any weakness or fatigue today.  She has no neurologic complaints.  She denies any recent fevers or illnesses.  She has a good appetite and denies weight loss.  She has no chest pain, shortness of breath, cough, or hemoptysis.  She denies any nausea, vomiting, constipation, or diarrhea.  She has no melena or hematochezia.  She has no urinary complaints.  Patient offers no specific complaints today.  REVIEW OF SYSTEMS:   Review of Systems  Constitutional:  Positive for malaise/fatigue. Negative for fever and weight loss.  Respiratory: Negative.  Negative for cough, hemoptysis and shortness of breath.   Cardiovascular: Negative.  Negative for chest pain and leg swelling.  Gastrointestinal: Negative.  Negative for abdominal pain, blood in stool and melena.  Genitourinary: Negative.  Negative for hematuria.  Musculoskeletal: Negative.  Negative for back pain.  Skin: Negative.  Negative for rash.  Neurological:  Positive for weakness. Negative for dizziness, focal weakness and headaches.  Psychiatric/Behavioral: Negative.  The patient is not nervous/anxious.      As per HPI. Otherwise, a complete review of systems is negative.  PAST MEDICAL HISTORY: Past Medical History:  Diagnosis Date   Allergy    Arthritis    Depression    Erythrodermic psoriasis    Fibromyalgia    GERD (gastroesophageal reflux disease)    Hyperlipemia    Insomnia    Obesity    Recurrent HSV (herpes simplex virus)    Sleep apnea    pt states does not use CPAP   Thyroid disease    Vitamin D  deficiency     PAST SURGICAL HISTORY: Past Surgical History:  Procedure Laterality Date   ABDOMINAL HYSTERECTOMY  [id number]   bladder tact     BREAST BIOPSY Right 09/02/2015   US  Guided biopsy - Ribbon shaped marker - negative   CHOLECYSTECTOMY  [id number]   COLONOSCOPY     COLONOSCOPY WITH PROPOFOL  N/A 02/10/2016   Procedure: COLONOSCOPY WITH PROPOFOL ;  Surgeon: Rogelia Copping, MD;  Location: [facility];  Service: Endoscopy;  Laterality: N/A;   ESOPHAGOGASTRODUODENOSCOPY     ESOPHAGOGASTRODUODENOSCOPY (EGD) WITH PROPOFOL  N/A 02/10/2016   Procedure: ESOPHAGOGASTRODUODENOSCOPY (EGD) WITH PROPOFOL ;  Surgeon: Rogelia Copping, MD;  Location: [facility];  Service: Endoscopy;  Laterality: N/A;  sleep apnea   LIPOMA EXCISION     POLYPECTOMY  02/10/2016   Procedure: POLYPECTOMY;  Surgeon: Rogelia Copping, MD;  Location: [facility];  Service: Endoscopy;;   TONSILECTOMY, ADENOIDECTOMY, BILATERAL MYRINGOTOMY AND TUBES      FAMILY HISTORY: Family History  Problem Relation Age of Onset   Hypertension Mother    Arthritis Mother    Cancer Mother        ovarian,breast, lung, liver   Breast cancer Mother 85   Ovarian cancer Mother  22   Lung cancer Mother 70   Colon cancer Brother    Other Father        lung problems   Ovarian cancer Maternal Grandmother        pt thinks is was ovarian but not sure   Heart disease Brother    Cancer Maternal Grandfather     ADVANCED DIRECTIVES (Y/N):  N  HEALTH MAINTENANCE: Social History   Tobacco Use   Smoking status:  Never   Smokeless tobacco: Never  Vaping Use   Vaping status: Never Used  Substance Use Topics   Alcohol use: Yes    Alcohol/week: 0.0 standard drinks of alcohol    Comment: rarely   Drug use: No     Colonoscopy:  PAP:  Bone density:  Lipid panel:  No Known Allergies  Current Outpatient Medications  Medication Sig Dispense Refill   folic acid  (FOLVITE ) 1 MG tablet Take 1 tablet (1 mg total) by mouth daily. 100 tablet 1   levothyroxine  (SYNTHROID ) 175 MCG tablet Take 1 tablet (175 mcg total) by mouth daily. New dose -recheck TSH in 6 weeks 90 tablet 0   omeprazole  (PRILOSEC) 10 MG capsule Take 10 mg by mouth daily. (Patient not taking: Reported on 05/29/2023)     No current facility-administered medications for this visit.    OBJECTIVE: Vitals:   10/10/23 1001  BP: 138/79  Pulse: 83  Resp: 18  Temp: 97.6 F (36.4 C)  SpO2: 100%     Body mass index is 30.56 kg/m.    ECOG FS:0 - Asymptomatic  General: Well-developed, well-nourished, no acute distress. Eyes: Pink conjunctiva, anicteric sclera. HEENT: Normocephalic, moist mucous membranes. Lungs: No audible wheezing or coughing. Heart: Regular rate and rhythm. Abdomen: Soft, nontender, no obvious distention. Musculoskeletal: No edema, cyanosis, or clubbing. Neuro: Alert, answering all questions appropriately. Cranial nerves grossly intact. Skin: No rashes or petechiae noted. Psych: Normal affect.  LAB RESULTS:  Lab Results  Component Value Date   NA 139 01/09/2023   K 4.5 01/09/2023   CL 104 01/09/2023   CO2 29 01/09/2023   GLUCOSE 96 01/09/2023   BUN 13 01/09/2023   CREATININE 0.73 01/09/2023   CALCIUM  9.5 01/09/2023   PROT 7.3 01/09/2023   ALBUMIN 4.0 04/21/2022   AST 12 01/09/2023   ALT 8 01/09/2023   ALKPHOS 121 04/21/2022   BILITOT 0.3 01/09/2023   GFRNONAA 54 (L) 04/21/2022   GFRAA 74 10/17/2019    Lab Results  Component Value Date   WBC 5.6 10/01/2023   NEUTROABS 3.3 10/01/2023   HGB 13.4  10/01/2023   HCT 41.3 10/01/2023   MCV 87.7 10/01/2023   PLT 332 10/01/2023   Lab Results  Component Value Date   IRON  71 10/01/2023   TIBC 328 10/01/2023   IRONPCTSAT 22 10/01/2023   Lab Results  Component Value Date   FERRITIN 20 10/01/2023     STUDIES: No results found.  ASSESSMENT: Iron  deficiency anemia.  PLAN:    Iron  deficiency anemia: Resolved.  Patient's hemoglobin and iron  stores are now within normal limits.  Colonoscopy on March 17, 2021 that did not report any significant pathology.  She does not require additional IV Venofer  today.  Patient last received treatment on June 26, 2023.  No intervention is needed.  Return to clinic in 4 months with repeat laboratory, further evaluation, and continuation of treatment if needed.   Leukopenia: Resolved. Hypertension: Patient's blood pressure is within normal limits today.  Continue monitoring and  treatment per primary care.   Patient expressed understanding and was in agreement with this plan. She also understands that She can call clinic at any time with any questions, concerns, or complaints.    Evalene JINNY Reusing, MD   10/10/2023 10:21 AM

## 2023-10-10 NOTE — Progress Notes (Unsigned)
 Patient was recently diagnosed with Celiac disease and Sjogren's (SHOW-grins) syndrome.

## 2023-10-12 ENCOUNTER — Encounter: Payer: Self-pay | Admitting: Oncology

## 2023-10-16 LAB — LAB REPORT - SCANNED: EGFR: 82

## 2023-10-18 ENCOUNTER — Telehealth: Payer: Self-pay | Admitting: Family Medicine

## 2023-10-18 NOTE — Telephone Encounter (Signed)
 Copied from CRM [id number]. Topic: Clinical - Medical Advice >> Oct 18, 2023 10:30 AM Tiffini S wrote: Reason for CRM:  Patient called about thyroid level being to low- 200 to 175 per Dr. Maree on 10/16/23 for blood work/ please call to follow up with the patient at  [phone]

## 2023-10-18 NOTE — Telephone Encounter (Signed)
 TSH lab was abnormal, called pt back no answer left detailed vm to call us  back and inform us  how she has been taking her thyroid medication. PCP currently out of the office and will be back Monday.

## 2023-10-18 NOTE — Telephone Encounter (Signed)
 Pt had labs with Dr.Shah on 10/16/2023, her TSH is lab is abnormal and would like for you to know. Pt is aware you will be back on 10/22/2023 and we will call her with any updates.

## 2023-10-22 ENCOUNTER — Other Ambulatory Visit: Payer: Self-pay | Admitting: Nurse Practitioner

## 2023-10-22 DIAGNOSIS — E538 Deficiency of other specified B group vitamins: Secondary | ICD-10-CM

## 2023-10-22 NOTE — Telephone Encounter (Signed)
 Pt informed verbalized understanding, pt states she was taking it daily but will follow new instructions.

## 2023-10-25 NOTE — Telephone Encounter (Signed)
 Requested Prescriptions  Pending Prescriptions Disp Refills   folic acid  (FOLVITE ) 1 MG tablet [Pharmacy Med Name: FOLIC ACID  1MG  TABLETS] 100 tablet 1    Sig: TAKE 1 TABLET(1 MG) BY MOUTH DAILY     Endocrinology:  Vitamins Passed - 10/25/2023  9:57 AM      Passed - Valid encounter within last 12 months    Recent Outpatient Visits           3 months ago Interstitial lung disease [facility])   [place] [facility] Glenard Mire, MD   6 months ago Interstitial lung disease [facility])   [facility] Sowles, Krichna, MD

## 2023-10-30 ENCOUNTER — Other Ambulatory Visit: Payer: Self-pay | Admitting: Family Medicine

## 2023-10-30 DIAGNOSIS — E039 Hypothyroidism, unspecified: Secondary | ICD-10-CM

## 2023-10-30 NOTE — Telephone Encounter (Signed)
 Patient just had TSH lab done on 10/16/23

## 2023-11-19 ENCOUNTER — Other Ambulatory Visit: Payer: Self-pay | Admitting: Nurse Practitioner

## 2023-11-19 DIAGNOSIS — I700 Atherosclerosis of aorta: Secondary | ICD-10-CM

## 2023-11-20 NOTE — Telephone Encounter (Signed)
 Unable to refill per protocol, Rx expired. Discontinued 04/17/23.  Requested Prescriptions  Pending Prescriptions Disp Refills   rosuvastatin  (CRESTOR ) 5 MG tablet [Pharmacy Med Name: ROSUVASTATIN  5MG  TABLETS] 90 tablet 1    Sig: TAKE 1 TABLET(5 MG) BY MOUTH DAILY     Cardiovascular:  Antilipid - Statins 2 Failed - 11/20/2023 11:33 AM      Failed - Lipid Panel in normal range within the last 12 months    Cholesterol, Total  Date Value Ref Range Status  07/22/2015 236 (H) 100 - 199 mg/dL Final   Cholesterol  Date Value Ref Range Status  07/24/2023 208 (H) <200 mg/dL Final   LDL Cholesterol (Calc)  Date Value Ref Range Status  07/24/2023 134 (H) mg/dL (calc) Final    Comment:    Reference range: <100 . Desirable range <100 mg/dL for primary prevention;   <70 mg/dL for patients with CHD or diabetic patients  with > or = 2 CHD risk factors. SABRA LDL-C is now calculated using the Martin-Hopkins  calculation, which is a validated novel method providing  better accuracy than the Friedewald equation in the  estimation of LDL-C.  Gladis APPLETHWAITE et al. SANDREA. 7986;689(80): 2061-2068  (http://education.[facility].com/faq/FAQ164)    HDL  Date Value Ref Range Status  07/24/2023 54 > OR = 50 mg/dL Final  94/88/7982 51 >60 mg/dL Final   Triglycerides  Date Value Ref Range Status  07/24/2023 98 <150 mg/dL Final         Passed - Cr in normal range and within 360 days    Creat  Date Value Ref Range Status  01/09/2023 0.73 0.50 - 1.05 mg/dL Final         Passed - Patient is not pregnant      Passed - Valid encounter within last 12 months    Recent Outpatient Visits           3 months ago Interstitial lung disease [facility])   [facility] Glenard Mire, MD   7 months ago Interstitial lung disease [facility])   [facility] Sowles, Krichna, MD

## 2024-01-22 ENCOUNTER — Ambulatory Visit: Admitting: Family Medicine

## 2024-01-22 ENCOUNTER — Encounter: Payer: Self-pay | Admitting: Family Medicine

## 2024-01-22 VITALS — BP 118/74 | HR 84 | Resp 16 | Ht 68.0 in | Wt 188.0 lb

## 2024-01-22 DIAGNOSIS — K581 Irritable bowel syndrome with constipation: Secondary | ICD-10-CM

## 2024-01-22 DIAGNOSIS — J849 Interstitial pulmonary disease, unspecified: Secondary | ICD-10-CM | POA: Diagnosis not present

## 2024-01-22 DIAGNOSIS — E039 Hypothyroidism, unspecified: Secondary | ICD-10-CM | POA: Diagnosis not present

## 2024-01-22 DIAGNOSIS — Z8639 Personal history of other endocrine, nutritional and metabolic disease: Secondary | ICD-10-CM

## 2024-01-22 DIAGNOSIS — I7300 Raynaud's syndrome without gangrene: Secondary | ICD-10-CM

## 2024-01-22 DIAGNOSIS — Z23 Encounter for immunization: Secondary | ICD-10-CM

## 2024-01-22 DIAGNOSIS — M3500 Sicca syndrome, unspecified: Secondary | ICD-10-CM

## 2024-01-22 DIAGNOSIS — K449 Diaphragmatic hernia without obstruction or gangrene: Secondary | ICD-10-CM

## 2024-01-22 MED ORDER — LEVOTHYROXINE SODIUM 150 MCG PO TABS: 150.0000 ug | ORAL_TABLET | Freq: Every day | ORAL | 0 refills | Status: DC

## 2024-01-22 NOTE — Patient Instructions (Signed)
 Raynaud's Phenomenon  Raynaud's phenomenon is a condition that affects the blood vessels (arteries) that carry blood to the fingers and toes. The arteries that supply blood to the ears, lips, nipples, or the tip of the nose might also be affected. Raynaud's phenomenon causes the arteries to become narrow temporarily (spasm). As a result, the flow of blood to the affected areas is temporarily decreased. This usually occurs in response to cold temperatures or stress. During an attack, the skin in the affected areas turns white, then blue, and finally red. A person may also feel tingling or numbness in those areas. Attacks usually last for only a brief period, and then the blood flow to the area returns to normal. In most cases, Raynaud's phenomenon does not cause serious health problems. What are the causes? In many cases, the cause of this condition is not known. The condition may occur on its own (primary Raynaud's phenomenon) or may be associated with other diseases or factors (secondary Raynaud's phenomenon). Possible causes may include: Diseases or medical conditions that damage the arteries. Injuries and repetitive actions that hurt the hands or feet. Being exposed to certain chemicals. Taking medicines that narrow the arteries. Other medical conditions, such as lupus, scleroderma, rheumatoid arthritis, thyroid problems, blood disorders, Sjogren syndrome, or atherosclerosis. What increases the risk? The following factors may make you more likely to develop this condition: Being 18-78 years old. Being female. Having a family history of Raynaud's phenomenon. Living in a cold climate. Smoking. What are the signs or symptoms? Symptoms of this condition usually occur when you are exposed to cold temperatures or when you have emotional stress. The symptoms may last for a few minutes or up to several hours. They usually affect your fingers but may also affect your toes, nipples, lips, ears, or the  tip of your nose. Symptoms may include: Changes in skin color. The skin in the affected areas will turn pale or white. The skin may then change from white to bluish to red as normal blood flow returns to the area. Numbness, tingling, or pain in the affected areas. In severe cases, symptoms may include: Skin sores. Tissues decaying and dying (gangrene). How is this diagnosed? This condition may be diagnosed based on: Your symptoms and medical history. A physical exam. During the exam, you may be asked to put your hands in cold water to check for a reaction to cold temperature. Tests, such as: Blood tests to check for other diseases or conditions. A test to check the movement of blood through your arteries and veins (vascular ultrasound). A test in which the skin at the base of your fingernail is examined under a microscope (nailfold capillaroscopy). How is this treated? During an episode, you can take actions to help symptoms go away faster. Options include moving your arms around in a windmill pattern, warming your fingers under warm water, or placing your fingers in a warm body fold, such as your armpit. Long-term treatment for this condition often involves making lifestyle changes and taking steps to control your exposure to cold temperature. For more severe cases, medicine (calcium channel blockers) may be used to improve blood circulation. Follow these instructions at home: Avoiding cold temperatures Take these steps to avoid exposure to cold: If possible, stay indoors during cold weather. When you go outside during cold weather, dress in layers and wear mittens, a hat, a scarf, and warm footwear. Wear mittens or gloves when handling ice or frozen food. Use holders for glasses or cans containing  cold drinks. Let warm water run for a while before taking a shower or bath. Warm up the car before driving in cold weather. Lifestyle If possible, avoid stressful and emotional situations. Try  to find ways to manage your stress, such as: Exercise. Yoga. Meditation. Biofeedback. Do not use any products that contain nicotine or tobacco. These products include cigarettes, chewing tobacco, and vaping devices, such as e-cigarettes. If you need help quitting, ask your health care provider. Avoid secondhand smoke. Limit your use of caffeine. Switch to decaffeinated coffee, tea, and soda. Avoid chocolate. Avoid vibrating tools and machinery. General instructions Protect your hands and feet from injuries, cuts, or bruises. Avoid wearing tight rings or wristbands. Wear loose fitting socks and comfortable, roomy shoes. Take over-the-counter and prescription medicines only as told by your health care provider. Where to find support Raynaud's Association: www.raynauds.org Where to find more information [employer] of Arthritis and Musculoskeletal and Skin Diseases: www.niams.http://www.myers.net/ Contact a health care provider if: Your discomfort becomes worse despite lifestyle changes. You develop sores on your fingers or toes that do not heal. You have breaks in the skin on your fingers or toes. You have a fever. You have pain or swelling in your joints. You have a rash. Your symptoms occur on only one side of your body. Get help right away if: Your fingers or toes turn black. You have severe pain in the affected areas. These symptoms may represent a serious problem that is an emergency. Do not wait to see if the symptoms will go away. Get medical help right away. Call your local emergency services (911 in the [place] Do not drive yourself to the hospital. Summary Raynaud's phenomenon is a condition that affects the arteries that carry blood to the fingers, toes, ears, lips, nipples, or the tip of the nose. In many cases, the cause of this condition is not known. Symptoms of this condition include changes in skin color along with numbness and tingling in the affected area. Treatment for  this condition includes lifestyle changes and reducing exposure to cold temperatures. Medicines may be used for severe cases of the condition. Contact your health care provider if your condition worsens despite treatment. This information is not intended to replace advice given to you by your health care provider. Make sure you discuss any questions you have with your health care provider. Document Revised: 05/04/2020 Document Reviewed: 05/04/2020 Elsevier Patient Education  2024 [employer].

## 2024-01-22 NOTE — Progress Notes (Signed)
 Name: Debra Soto   MRN: [id number]    DOB: [birth year]   Date:01/22/2024       Progress Note  Subjective  Chief Complaint  Chief Complaint  Patient presents with   Medical Management of Chronic Issues   Discussed the use of AI scribe software for clinical note transcription with the patient, who gave verbal consent to proceed.  History of Present Illness Debra Soto is a 63 year old female who presents with numbness and discoloration of fingers.  She experiences episodes of numbness and discoloration in her fingers, describing them as turning completely white and numb for about 20 minutes before circulation returns. This morning, her left middle finger was affected. These episodes can occur in one or two fingers at a time, on either hand, and sometimes involve the thumb. Previous tests by a neurologist were inconclusive. The episodes are random and infrequent, often forgotten during medical visits.  She has experienced significant weight loss, dropping from 269 pounds to 188 pounds since May. She reports a lack of appetite and difficulty eating more than a few bites at a time, sometimes leading to vomiting if she overeats. She reports having a hiatal hernia, which was identified on a CT scan. . A colonoscopy and an upper endoscopy in 2023 did not reveal significant findings. Despite the weight loss, she feels stronger after joining a gym and working with a psychologist, educational.   She has a history of interstitial lung disease and reports improvement in her breathing with exercises. No current shortness of breath is reported, and meditation and breathing exercises have improved her lung function.  She has a history of iron  deficiency anemia and leukopenia and reports receiving iron  infusions. Her last iron  infusion treatment was in April, and she reports that her blood work in July showed normal results for her white blood cell count and platelets.  She experiences constipation, requiring enemas for  relief, and describes her bowel movements as clay-like. She has a history of IBS with alternating constipation and diarrhea but is not currently taking any medication for it. She recently started using Metamucil for fiber supplementation.  She mentions hair loss, attributing it to her rapid weight loss, and expresses concern about the ongoing amount of hair loss since her weight began to decrease.  She has been informed by her rheumatologist and neurologist that she does not have Sjogren's syndrome, despite previous concerns about dryness in her eyes and mouth. She also reports no celiac disease.    Patient Active Problem List   Diagnosis Date Noted   History of iron  deficiency 01/22/2024   Raynaud's disease without gangrene 01/22/2024   Memory problem 01/09/2023   Degeneration of intervertebral disc of lumbar region with discogenic back pain 01/09/2023   Family history of Alzheimer's disease 01/09/2023   Attention deficit hyperactivity disorder (ADHD), combined type 09/05/2022   Folic acid  deficiency 09/05/2022   Atherosclerosis of abdominal aorta 05/30/2022   B12 deficiency 05/30/2022   Hiatal hernia without gangrene and obstruction 05/30/2022   Sicca syndrome 04/21/2022   Interstitial lung disease (HCC) 05/16/2021   Iron  deficiency anemia 11/06/2017   Benign neoplasm of descending colon    Heartburn    Psoriasis 01/25/2016   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/22/2015   Endometriosis 12/31/2014   Insomnia, persistent 09/29/2014   IBS (irritable bowel syndrome) 09/29/2014   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/2014   Dyslipidemia 09/29/2014   Fibromyalgia syndrome 09/29/2014   Gastro-esophageal reflux disease without esophagitis 09/29/2014   Herpes labialis 09/29/2014  Adult hypothyroidism 09/29/2014   Family history of malignant neoplasm of breast 09/29/2014   Major depression, recurrent, chronic 09/29/2014   Obstructive apnea 09/29/2014   Abnormal presence of protein in urine  09/29/2014   Allergic rhinitis 09/29/2014   Vitamin D  deficiency 09/29/2014   Urge incontinence 09/29/2014   Headache, tension-type 09/29/2014   Menopausal symptom 09/29/2014    Past Surgical History:  Procedure Laterality Date   ABDOMINAL HYSTERECTOMY  [id number]   bladder tact     BREAST BIOPSY Right 09/02/2015   US  Guided biopsy - Ribbon shaped marker - negative   CHOLECYSTECTOMY  [id number]   COLONOSCOPY     COLONOSCOPY WITH PROPOFOL  N/A 02/10/2016   Procedure: COLONOSCOPY WITH PROPOFOL ;  Surgeon: Rogelia Copping, MD;  Location: [facility];  Service: Endoscopy;  Laterality: N/A;   ESOPHAGOGASTRODUODENOSCOPY     ESOPHAGOGASTRODUODENOSCOPY (EGD) WITH PROPOFOL  N/A 02/10/2016   Procedure: ESOPHAGOGASTRODUODENOSCOPY (EGD) WITH PROPOFOL ;  Surgeon: Rogelia Copping, MD;  Location: [facility];  Service: Endoscopy;  Laterality: N/A;  sleep apnea   LIPOMA EXCISION     POLYPECTOMY  02/10/2016   Procedure: POLYPECTOMY;  Surgeon: Rogelia Copping, MD;  Location: [facility];  Service: Endoscopy;;   TONSILECTOMY, ADENOIDECTOMY, BILATERAL MYRINGOTOMY AND TUBES      Family History  Problem Relation Age of Onset   Hypertension Mother    Arthritis Mother    Cancer Mother        ovarian,breast, lung, liver   Breast cancer Mother 50   Ovarian cancer Mother 50   Lung cancer Mother 31   Colon cancer Brother    Other Father        lung problems   Ovarian cancer Maternal Grandmother        pt thinks is was ovarian but not sure   Heart disease Brother    Cancer Maternal Grandfather     Social History   Tobacco Use   Smoking status: Never   Smokeless tobacco: Never  Substance Use Topics   Alcohol use: Yes    Alcohol/week: 0.0 standard drinks of alcohol    Comment: rarely     Current Outpatient Medications:    levothyroxine  (SYNTHROID ) 150 MCG tablet, Take 1 tablet (150 mcg total) by mouth daily before breakfast. New dose again, recheck in 6 weeks, Disp: 90 tablet, Rfl:  0  No Known Allergies  I personally reviewed active problem list, medication list, allergies, family history with the patient/caregiver today.   ROS  Ten systems reviewed and is negative except as mentioned in HPI    Objective Physical Exam MEASUREMENTS: Weight- 188, BMI- 28.0. CONSTITUTIONAL: Patient appears well-developed and well-nourished.  No distress. HEENT: Head atraumatic, normocephalic, neck supple. CARDIOVASCULAR: Normal rate, regular rhythm and normal heart sounds.  No murmur heard. No BLE edema. PULMONARY: Effort normal and breath sounds normal. No respiratory distress. ABDOMINAL: There is no tenderness or distention. MUSCULOSKELETAL: Normal gait. Without gross motor or sensory deficit. PSYCHIATRIC: Patient has a normal mood and affect. behavior is normal. Judgment and thought content normal.  Vitals:   01/22/24 0921  BP: 118/74  Pulse: 84  Resp: 16  SpO2: 98%  Weight: 188 lb (85.3 kg)  Height: 5' 8 (1.727 m)    Body mass index is 28.59 kg/m.   PHQ2/9:    01/22/2024    9:16 AM 07/24/2023    9:54 AM 04/17/2023    8:38 AM 01/09/2023    8:25 AM 01/09/2023    8:22 AM  Depression screen PHQ  2/9  Decreased Interest 1 0 0 0 0  Down, Depressed, Hopeless 1 0 0 0 0  PHQ - 2 Score 2 0 0 0 0  Altered sleeping 0 0 0 0   Tired, decreased energy 0 0 0 0   Change in appetite 0 0 0 0   Feeling bad or failure about yourself  0 0 0 0   Trouble concentrating 1 0 0 0   Moving slowly or fidgety/restless 0 0 0 0   Suicidal thoughts 0 0 0 0   PHQ-9 Score 3 0  0  0    Difficult doing work/chores Not difficult at all Not difficult at all Not difficult at all Not difficult at all      Data saved with a previous flowsheet row definition    phq 9 is positive  Fall Risk:    01/22/2024    9:15 AM 07/24/2023    9:48 AM 04/17/2023    8:38 AM 01/09/2023    8:22 AM 09/05/2022    8:44 AM  Fall Risk   Falls in the past year? 1 1 0 0 0  Number falls in past yr: 0 0 0 0 0   Injury with Fall? 0 0 0 0 0  Risk for fall due to : Impaired balance/gait Impaired balance/gait No Fall Risks No Fall Risks No Fall Risks  Follow up Falls evaluation completed Falls prevention discussed;Education provided;Falls evaluation completed Falls prevention discussed;Education provided;Falls evaluation completed Falls prevention discussed Falls prevention discussed      Assessment & Plan Raynaud's phenomenon Intermittent finger blanching and numbness, likely rheumatological. Symptoms infrequent, no daily medication needed. - Provided information on Raynaud's phenomenon. - Advised to keep hands warm and avoid cold temperatures. - Encouraged exercise.  Unintentional weight loss and hair loss in the setting of hiatal hernia and hypothyroidism Significant weight loss and hair loss. Thyroid function controlled with levothyroxine . Hiatal hernia may cause early satiety. Hair loss likely due to weight loss. No malignancy evidence, hematologist evaluation advised. - Advised small, frequent meals and consider protein shakes. - Instructed to discuss potential CT chest, abdomen, and pelvis with hematologist. - Continue levothyroxine  150 mcg daily. - Encouraged follow-up with hematologist and rheumatologist.  Interstitial lung disease Symptoms improved with breathing exercises and meditation. No current shortness of breath. - Continue breathing exercises and meditation. - Maintain follow-up with pulmonologist.  Irritable bowel syndrome with constipation Chronic constipation with recent exacerbation. IBS with alternating constipation and diarrhea. Not on IBS medication. - Recommended trial of Metamucil for fiber supplementation. - Discussed potential use of MiraLAX for constipation. - Encouraged follow-up with gastroenterologist.  Sicca syndrome - states improving

## 2024-02-11 ENCOUNTER — Inpatient Hospital Stay: Attending: Oncology

## 2024-02-11 DIAGNOSIS — D509 Iron deficiency anemia, unspecified: Secondary | ICD-10-CM | POA: Insufficient documentation

## 2024-02-11 LAB — IRON AND TIBC
Iron: 65 ug/dL (ref 28–170)
Saturation Ratios: 19 % (ref 10.4–31.8)
TIBC: 349 ug/dL (ref 250–450)
UIBC: 284 ug/dL

## 2024-02-11 LAB — CBC WITH DIFFERENTIAL/PLATELET
Abs Immature Granulocytes: 0.01 K/uL (ref 0.00–0.07)
Basophils Absolute: 0.1 K/uL (ref 0.0–0.1)
Basophils Relative: 1 %
Eosinophils Absolute: 0.1 K/uL (ref 0.0–0.5)
Eosinophils Relative: 2 %
HCT: 41.4 % (ref 36.0–46.0)
Hemoglobin: 13.6 g/dL (ref 12.0–15.0)
Immature Granulocytes: 0 %
Lymphocytes Relative: 34 %
Lymphs Abs: 1.6 K/uL (ref 0.7–4.0)
MCH: 29.2 pg (ref 26.0–34.0)
MCHC: 32.9 g/dL (ref 30.0–36.0)
MCV: 88.8 fL (ref 80.0–100.0)
Monocytes Absolute: 0.3 K/uL (ref 0.1–1.0)
Monocytes Relative: 7 %
Neutro Abs: 2.7 K/uL (ref 1.7–7.7)
Neutrophils Relative %: 56 %
Platelets: 312 K/uL (ref 150–400)
RBC: 4.66 MIL/uL (ref 3.87–5.11)
RDW: 13.2 % (ref 11.5–15.5)
WBC: 4.7 K/uL (ref 4.0–10.5)
nRBC: 0 % (ref 0.0–0.2)

## 2024-02-11 LAB — FERRITIN: Ferritin: 24 ng/mL (ref 11–307)

## 2024-02-12 ENCOUNTER — Inpatient Hospital Stay

## 2024-02-12 ENCOUNTER — Ambulatory Visit: Admitting: Internal Medicine

## 2024-02-12 ENCOUNTER — Encounter: Payer: Self-pay | Admitting: Oncology

## 2024-02-12 ENCOUNTER — Inpatient Hospital Stay: Admitting: Oncology

## 2024-02-12 VITALS — BP 109/87 | HR 92 | Temp 97.7°F | Resp 20 | Ht 68.0 in | Wt 187.5 lb

## 2024-02-12 DIAGNOSIS — D509 Iron deficiency anemia, unspecified: Secondary | ICD-10-CM | POA: Diagnosis not present

## 2024-02-12 NOTE — Progress Notes (Unsigned)
 Patient was having some chest pain, back pain, and arm pain so she went to the ED last week, which they told her that nothing was really wrong. She is still having the same pain now, severely. No appetite, and feeling dizzy. Hair and nails are falling out. Yesterday she and the nurse taking her blood told her that her skin color was a yellowish color. The other doctor thinks she might have Raynaud's disease.

## 2024-02-12 NOTE — Progress Notes (Unsigned)
 [facility]  [phone] Fax:([phone]) [phone]  ID: Debra Soto OB: 12-Apr-1960  MR#: [id number]  RDW#:[id number]  Patient Care Team: Sowles, Krichna, MD as PCP - General (Family Medicine) Perla Evalene PARAS, MD as Consulting Physician (Cardiology) Maree Jannett POUR, MD as Consulting Physician (Neurology) Maree Korene PEDLAR, MD as Referring Physician (Rheumatology) Jacobo Evalene PARAS, MD as Consulting Physician (Oncology) Ramsay, Alm KATHEE CLORE, MD as Referring Physician (Internal Medicine) Allene Mildred Amen, MD as Referring Physician (Pulmonary Disease) [place] Rogelia BROCKS, OD (Optometry)  CHIEF COMPLAINT: Iron  deficiency anemia.  INTERVAL HISTORY: Patient returns to clinic today for repeat laboratory, further evaluation, consideration of additional IV Venofer .  She currently feels well.  She does not complain of any weakness or fatigue today.  She has no neurologic complaints.  She denies any recent fevers or illnesses.  She has a good appetite and denies weight loss.  She has no chest pain, shortness of breath, cough, or hemoptysis.  She denies any nausea, vomiting, constipation, or diarrhea.  She has no melena or hematochezia.  She has no urinary complaints.  Patient offers no specific complaints today.  REVIEW OF SYSTEMS:   Review of Systems  Constitutional:  Positive for malaise/fatigue. Negative for fever and weight loss.  Respiratory: Negative.  Negative for cough, hemoptysis and shortness of breath.   Cardiovascular: Negative.  Negative for chest pain and leg swelling.  Gastrointestinal: Negative.  Negative for abdominal pain, blood in stool and melena.  Genitourinary: Negative.  Negative for hematuria.  Musculoskeletal: Negative.  Negative for back pain.  Skin: Negative.  Negative for rash.  Neurological:  Positive for weakness. Negative for dizziness, focal weakness and headaches.  Psychiatric/Behavioral: Negative.  The patient is not nervous/anxious.      As per HPI. Otherwise, a complete review of systems is negative.  PAST MEDICAL HISTORY: Past Medical History:  Diagnosis Date  . Allergy   . Arthritis   . Depression   . Erythrodermic psoriasis   . Fibromyalgia   . GERD (gastroesophageal reflux disease)   . Hyperlipemia   . Insomnia   . Obesity   . Recurrent HSV (herpes simplex virus)   . Sleep apnea    pt states does not use CPAP  . Thyroid disease   . Vitamin D  deficiency     PAST SURGICAL HISTORY: Past Surgical History:  Procedure Laterality Date  . ABDOMINAL HYSTERECTOMY  [id number]  . bladder tact    . BREAST BIOPSY Right 09/02/2015   US  Guided biopsy - Ribbon shaped marker - negative  . CHOLECYSTECTOMY  [id number]  . COLONOSCOPY    . COLONOSCOPY WITH PROPOFOL  N/A 02/10/2016   Procedure: COLONOSCOPY WITH PROPOFOL ;  Surgeon: Rogelia Copping, MD;  Location[facility];  Service: Endoscopy;  Laterality: N/A;  . ESOPHAGOGASTRODUODENOSCOPY    . ESOPHAGOGASTRODUODENOSCOPY (EGD) WITH PROPOFOL  N/A 02/10/2016   Procedure: ESOPHAGOGASTRODUODENOSCOPY (EGD) WITH PROPOFOL ;  Surgeon: Rogelia Copping, MD;  Location[facility];  Service: Endoscopy;  Laterality: N/A;  sleep apnea  . LIPOMA EXCISION    . POLYPECTOMY  02/10/2016   Procedure: POLYPECTOMY;  Surgeon: Rogelia Copping, MD;  Location: [facility];  Service: Endoscopy;;  . TONSILECTOMY, ADENOIDECTOMY, BILATERAL MYRINGOTOMY AND TUBES      FAMILY HISTORY: Family History  Problem Relation Age of Onset  . Hypertension Mother   . Arthritis Mother   . Cancer Mother        ovarian,breast, lung, liver  . Breast cancer Mother 43  . Ovarian cancer Mother  22  . Lung cancer Mother 27  . Colon cancer Brother   . Other Father        lung problems  . Ovarian cancer Maternal Grandmother        pt thinks is was ovarian but not sure  . Heart disease Brother   . Cancer Maternal Grandfather     ADVANCED DIRECTIVES (Y/N):  N  HEALTH MAINTENANCE: Social History    Tobacco Use  . Smoking status: Never  . Smokeless tobacco: Never  Vaping Use  . Vaping status: Never Used  Substance Use Topics  . Alcohol use: Yes    Alcohol/week: 0.0 standard drinks of alcohol    Comment: rarely  . Drug use: No     Colonoscopy:  PAP:  Bone density:  Lipid panel:  No Known Allergies  Current Outpatient Medications  Medication Sig Dispense Refill  . levothyroxine  (SYNTHROID ) 150 MCG tablet Take 1 tablet (150 mcg total) by mouth daily before breakfast. New dose again, recheck in 6 weeks 90 tablet 0   No current facility-administered medications for this visit.    OBJECTIVE: Vitals:   02/12/24 1029  BP: 109/87  Pulse: 92  Resp: 20  Temp: 97.7 F (36.5 C)  SpO2: 100%     Body mass index is 28.51 kg/m.    ECOG FS:0 - Asymptomatic  General: Well-developed, well-nourished, no acute distress. Eyes: Pink conjunctiva, anicteric sclera. HEENT: Normocephalic, moist mucous membranes. Lungs: No audible wheezing or coughing. Heart: Regular rate and rhythm. Abdomen: Soft, nontender, no obvious distention. Musculoskeletal: No edema, cyanosis, or clubbing. Neuro: Alert, answering all questions appropriately. Cranial nerves grossly intact. Skin: No rashes or petechiae noted. Psych: Normal affect.  LAB RESULTS:  Lab Results  Component Value Date   NA 139 01/09/2023   K 4.5 01/09/2023   CL 104 01/09/2023   CO2 29 01/09/2023   GLUCOSE 96 01/09/2023   BUN 13 01/09/2023   CREATININE 0.73 01/09/2023   CALCIUM  9.5 01/09/2023   PROT 7.3 01/09/2023   ALBUMIN 4.0 04/21/2022   AST 12 01/09/2023   ALT 8 01/09/2023   ALKPHOS 121 04/21/2022   BILITOT 0.3 01/09/2023   GFRNONAA 54 (L) 04/21/2022   GFRAA 74 10/17/2019    Lab Results  Component Value Date   WBC 4.7 02/11/2024   NEUTROABS 2.7 02/11/2024   HGB 13.6 02/11/2024   HCT 41.4 02/11/2024   MCV 88.8 02/11/2024   PLT 312 02/11/2024   Lab Results  Component Value Date   IRON  65 02/11/2024    TIBC 349 02/11/2024   IRONPCTSAT 19 02/11/2024   Lab Results  Component Value Date   FERRITIN 24 02/11/2024     STUDIES: No results found.  ASSESSMENT: Iron  deficiency anemia.  PLAN:    Iron  deficiency anemia: Resolved.  Patient's hemoglobin and iron  stores are now within normal limits.  Colonoscopy on March 17, 2021 that did not report any significant pathology.  She does not require additional IV Venofer  today.  Patient last received treatment on June 26, 2023.  No intervention is needed.  Return to clinic in 4 months with repeat laboratory, further evaluation, and continuation of treatment if needed.   Leukopenia: Resolved. Hypertension: Patient's blood pressure is within normal limits today.  Continue monitoring and treatment per primary care.   Patient expressed understanding and was in agreement with this plan. She also understands that She can call clinic at any time with any questions, concerns, or complaints.    Recie Cirrincione J Nalee Lightle,  MD   02/12/2024 10:39 AM

## 2024-02-13 ENCOUNTER — Encounter: Payer: Self-pay | Admitting: Oncology

## 2024-04-17 ENCOUNTER — Other Ambulatory Visit: Payer: Self-pay | Admitting: Family Medicine

## 2024-04-17 DIAGNOSIS — E039 Hypothyroidism, unspecified: Secondary | ICD-10-CM

## 2024-04-18 NOTE — Telephone Encounter (Signed)
 Requested Prescriptions  Pending Prescriptions Disp Refills   levothyroxine  (SYNTHROID ) 150 MCG tablet [Pharmacy Med Name: LEVOTHYROXINE  0.150MG  ( ) TAB] 90 tablet 0    Sig: TAKE 1 TABLET(150 MCG) BY MOUTH DAILY BEFORE BREAKFAST     Endocrinology:  Hypothyroid Agents Failed - 04/18/2024  2:02 PM      Failed - TSH in normal range and within 360 days    TSH  Date Value Ref Range Status  07/24/2023 0.11 (L) 0.40 - 4.50 mIU/L Final         Passed - Valid encounter within last 12 months    Recent Outpatient Visits           2 months ago Interstitial lung disease [facility])   [place] [facility] Glenard Mire, MD   8 months ago Interstitial lung disease [facility])   [facility] Glenard Mire, MD   1 year ago Interstitial lung disease [facility])   [facility] Sowles, Krichna, MD

## 2024-04-29 ENCOUNTER — Ambulatory Visit: Admitting: Family Medicine

## 2024-06-16 ENCOUNTER — Inpatient Hospital Stay

## 2024-06-17 ENCOUNTER — Inpatient Hospital Stay: Admitting: Oncology

## 2024-06-17 ENCOUNTER — Inpatient Hospital Stay
